# Patient Record
Sex: Male | Born: 1935 | Race: White | Hispanic: No | Marital: Married | Smoking: Former smoker
Health system: Southern US, Community
[De-identification: ages and names within clinical notes are randomized; demographics above are authoritative.]

## PROBLEM LIST (undated history)

## (undated) DIAGNOSIS — I10 Essential (primary) hypertension: Secondary | ICD-10-CM

## (undated) DIAGNOSIS — G4730 Sleep apnea, unspecified: Secondary | ICD-10-CM

## (undated) DIAGNOSIS — Z8709 Personal history of other diseases of the respiratory system: Secondary | ICD-10-CM

## (undated) DIAGNOSIS — Z87442 Personal history of urinary calculi: Secondary | ICD-10-CM

## (undated) DIAGNOSIS — J45909 Unspecified asthma, uncomplicated: Secondary | ICD-10-CM

## (undated) DIAGNOSIS — Z8669 Personal history of other diseases of the nervous system and sense organs: Secondary | ICD-10-CM

## (undated) DIAGNOSIS — R350 Frequency of micturition: Secondary | ICD-10-CM

## (undated) DIAGNOSIS — Z8719 Personal history of other diseases of the digestive system: Secondary | ICD-10-CM

## (undated) DIAGNOSIS — IMO0001 Reserved for inherently not codable concepts without codable children: Secondary | ICD-10-CM

## (undated) HISTORY — PX: SPINE SURGERY: SHX786

---

## 1998-05-20 ENCOUNTER — Ambulatory Visit (HOSPITAL_COMMUNITY): Admission: RE | Admit: 1998-05-20 | Discharge: 1998-05-20 | Payer: Self-pay | Admitting: *Deleted

## 2000-07-15 ENCOUNTER — Ambulatory Visit (HOSPITAL_BASED_OUTPATIENT_CLINIC_OR_DEPARTMENT_OTHER): Admission: RE | Admit: 2000-07-15 | Discharge: 2000-07-15 | Payer: Self-pay | Admitting: Internal Medicine

## 2001-10-01 ENCOUNTER — Encounter (INDEPENDENT_AMBULATORY_CARE_PROVIDER_SITE_OTHER): Payer: Self-pay | Admitting: Specialist

## 2001-10-01 ENCOUNTER — Ambulatory Visit (HOSPITAL_COMMUNITY): Admission: RE | Admit: 2001-10-01 | Discharge: 2001-10-01 | Payer: Self-pay | Admitting: *Deleted

## 2002-02-19 ENCOUNTER — Encounter: Payer: Self-pay | Admitting: Physical Medicine and Rehabilitation

## 2002-02-19 ENCOUNTER — Ambulatory Visit (HOSPITAL_COMMUNITY)
Admission: RE | Admit: 2002-02-19 | Discharge: 2002-02-19 | Payer: Self-pay | Admitting: Physical Medicine and Rehabilitation

## 2003-06-12 HISTORY — PX: BLEPHAROPLASTY: SUR158

## 2003-08-11 ENCOUNTER — Encounter (INDEPENDENT_AMBULATORY_CARE_PROVIDER_SITE_OTHER): Payer: Self-pay | Admitting: Specialist

## 2003-08-11 ENCOUNTER — Ambulatory Visit (HOSPITAL_COMMUNITY): Admission: RE | Admit: 2003-08-11 | Discharge: 2003-08-11 | Payer: Self-pay | Admitting: *Deleted

## 2003-10-26 ENCOUNTER — Ambulatory Visit (HOSPITAL_COMMUNITY): Admission: RE | Admit: 2003-10-26 | Discharge: 2003-10-26 | Payer: Self-pay | Admitting: Ophthalmology

## 2004-03-11 HISTORY — PX: LUMBAR FUSION: SHX111

## 2004-04-14 ENCOUNTER — Ambulatory Visit: Payer: Self-pay | Admitting: Physical Medicine & Rehabilitation

## 2004-04-14 ENCOUNTER — Inpatient Hospital Stay (HOSPITAL_COMMUNITY)
Admission: RE | Admit: 2004-04-14 | Discharge: 2004-04-19 | Payer: Self-pay | Admitting: Physical Medicine & Rehabilitation

## 2004-07-06 ENCOUNTER — Emergency Department (HOSPITAL_COMMUNITY): Admission: EM | Admit: 2004-07-06 | Discharge: 2004-07-06 | Payer: Self-pay | Admitting: Emergency Medicine

## 2004-07-14 ENCOUNTER — Emergency Department (HOSPITAL_COMMUNITY): Admission: EM | Admit: 2004-07-14 | Discharge: 2004-07-14 | Payer: Self-pay | Admitting: Emergency Medicine

## 2004-09-18 ENCOUNTER — Ambulatory Visit: Payer: Self-pay | Admitting: Internal Medicine

## 2004-09-29 ENCOUNTER — Ambulatory Visit: Payer: Self-pay | Admitting: Internal Medicine

## 2005-01-11 ENCOUNTER — Ambulatory Visit: Payer: Self-pay | Admitting: Internal Medicine

## 2005-01-27 ENCOUNTER — Emergency Department (HOSPITAL_COMMUNITY): Admission: EM | Admit: 2005-01-27 | Discharge: 2005-01-27 | Payer: Self-pay | Admitting: Emergency Medicine

## 2005-03-23 ENCOUNTER — Ambulatory Visit: Payer: Self-pay | Admitting: Internal Medicine

## 2005-04-10 ENCOUNTER — Ambulatory Visit: Payer: Self-pay | Admitting: Internal Medicine

## 2005-06-11 HISTORY — PX: ANTERIOR CERVICAL DECOMP/DISCECTOMY FUSION: SHX1161

## 2005-06-28 ENCOUNTER — Ambulatory Visit: Payer: Self-pay | Admitting: Internal Medicine

## 2005-08-08 ENCOUNTER — Ambulatory Visit (HOSPITAL_COMMUNITY): Admission: RE | Admit: 2005-08-08 | Discharge: 2005-08-08 | Payer: Self-pay | Admitting: *Deleted

## 2005-08-08 ENCOUNTER — Encounter (INDEPENDENT_AMBULATORY_CARE_PROVIDER_SITE_OTHER): Payer: Self-pay | Admitting: Specialist

## 2005-08-31 ENCOUNTER — Ambulatory Visit: Payer: Self-pay | Admitting: Internal Medicine

## 2005-12-27 ENCOUNTER — Ambulatory Visit: Payer: Self-pay | Admitting: Internal Medicine

## 2006-01-17 ENCOUNTER — Ambulatory Visit: Payer: Self-pay | Admitting: Internal Medicine

## 2006-03-07 ENCOUNTER — Ambulatory Visit: Payer: Self-pay | Admitting: Internal Medicine

## 2006-04-26 ENCOUNTER — Ambulatory Visit: Payer: Self-pay | Admitting: Internal Medicine

## 2006-09-09 ENCOUNTER — Ambulatory Visit: Payer: Self-pay | Admitting: Internal Medicine

## 2007-03-11 ENCOUNTER — Ambulatory Visit: Payer: Self-pay | Admitting: Internal Medicine

## 2007-06-20 DIAGNOSIS — J45909 Unspecified asthma, uncomplicated: Secondary | ICD-10-CM | POA: Insufficient documentation

## 2007-06-23 ENCOUNTER — Ambulatory Visit: Payer: Self-pay | Admitting: Internal Medicine

## 2007-07-01 DIAGNOSIS — G4730 Sleep apnea, unspecified: Secondary | ICD-10-CM | POA: Insufficient documentation

## 2007-07-01 DIAGNOSIS — K219 Gastro-esophageal reflux disease without esophagitis: Secondary | ICD-10-CM

## 2007-07-01 DIAGNOSIS — J309 Allergic rhinitis, unspecified: Secondary | ICD-10-CM | POA: Insufficient documentation

## 2007-09-03 ENCOUNTER — Ambulatory Visit: Payer: Self-pay | Admitting: Internal Medicine

## 2007-10-09 ENCOUNTER — Ambulatory Visit (HOSPITAL_COMMUNITY): Admission: RE | Admit: 2007-10-09 | Discharge: 2007-10-09 | Payer: Self-pay | Admitting: *Deleted

## 2007-10-09 ENCOUNTER — Encounter (INDEPENDENT_AMBULATORY_CARE_PROVIDER_SITE_OTHER): Payer: Self-pay | Admitting: *Deleted

## 2008-04-02 ENCOUNTER — Ambulatory Visit: Payer: Self-pay | Admitting: Internal Medicine

## 2008-06-21 ENCOUNTER — Ambulatory Visit: Payer: Self-pay | Admitting: Internal Medicine

## 2008-07-23 ENCOUNTER — Encounter: Payer: Self-pay | Admitting: Internal Medicine

## 2008-10-05 ENCOUNTER — Ambulatory Visit: Payer: Self-pay | Admitting: Internal Medicine

## 2009-02-04 ENCOUNTER — Ambulatory Visit (HOSPITAL_COMMUNITY): Admission: RE | Admit: 2009-02-04 | Discharge: 2009-02-04 | Payer: Self-pay | Admitting: *Deleted

## 2009-05-19 ENCOUNTER — Ambulatory Visit: Payer: Self-pay | Admitting: Internal Medicine

## 2009-05-25 ENCOUNTER — Ambulatory Visit: Payer: Self-pay | Admitting: Internal Medicine

## 2009-06-13 ENCOUNTER — Ambulatory Visit: Payer: Self-pay | Admitting: Internal Medicine

## 2009-10-20 ENCOUNTER — Telehealth: Payer: Self-pay | Admitting: Internal Medicine

## 2009-10-28 ENCOUNTER — Ambulatory Visit: Payer: Self-pay | Admitting: Internal Medicine

## 2009-10-28 ENCOUNTER — Telehealth: Payer: Self-pay | Admitting: Internal Medicine

## 2009-11-01 ENCOUNTER — Ambulatory Visit: Payer: Self-pay | Admitting: Internal Medicine

## 2010-05-03 ENCOUNTER — Ambulatory Visit: Payer: Self-pay | Admitting: Internal Medicine

## 2010-05-15 ENCOUNTER — Telehealth (INDEPENDENT_AMBULATORY_CARE_PROVIDER_SITE_OTHER): Payer: Self-pay | Admitting: *Deleted

## 2010-05-17 ENCOUNTER — Telehealth (INDEPENDENT_AMBULATORY_CARE_PROVIDER_SITE_OTHER): Payer: Self-pay | Admitting: *Deleted

## 2010-05-17 ENCOUNTER — Ambulatory Visit: Payer: Self-pay | Admitting: Internal Medicine

## 2010-05-17 DIAGNOSIS — J069 Acute upper respiratory infection, unspecified: Secondary | ICD-10-CM | POA: Insufficient documentation

## 2010-06-13 ENCOUNTER — Ambulatory Visit
Admission: RE | Admit: 2010-06-13 | Discharge: 2010-06-13 | Payer: Self-pay | Source: Home / Self Care | Attending: Internal Medicine | Admitting: Internal Medicine

## 2010-06-20 ENCOUNTER — Telehealth (INDEPENDENT_AMBULATORY_CARE_PROVIDER_SITE_OTHER): Payer: Self-pay | Admitting: *Deleted

## 2010-07-01 ENCOUNTER — Observation Stay (HOSPITAL_COMMUNITY)
Admission: EM | Admit: 2010-07-01 | Discharge: 2010-07-02 | Payer: Self-pay | Source: Home / Self Care | Attending: Internal Medicine | Admitting: Internal Medicine

## 2010-07-02 ENCOUNTER — Encounter: Payer: Self-pay | Admitting: Internal Medicine

## 2010-07-03 ENCOUNTER — Ambulatory Visit (HOSPITAL_COMMUNITY)
Admission: RE | Admit: 2010-07-03 | Discharge: 2010-07-03 | Payer: Self-pay | Source: Home / Self Care | Attending: Internal Medicine | Admitting: Internal Medicine

## 2010-07-03 NOTE — Discharge Summary (Addendum)
  NAME:  [name], [name] NO.:  [id number]  MEDICAL RECORD NO.:  [id number]          PATIENT TYPE:  INP  LOCATION:  3014                         FACILITY:  [facility]  PHYSICIAN:  Tarry Kos, MD       DATE OF BIRTH:  06-30-1935  DATE OF ADMISSION:  07/01/2010 DATE OF DISCHARGE:  07/02/2010                              DISCHARGE SUMMARY   DISCHARGE DIAGNOSES: 1. Presyncope. 2. Rule out cerebrovascular accident.  SUMMARY OF HOSPITAL COURSE:  Mr. Fariss is a 75 year old male who presented to the emergency room with headache, almost passing out, was admitted as there was some concern that he was having a stroke.  He had serial cardiac enzymes, which were all negative and thyroid studies were normal.  He had an MRI of his brain along with MRI/MRA of his head which was normal.  The patient's symptoms quickly resolved.  He is being discharged home to follow up with his primary care physician in 1-2 weeks at which point we will set up carotid Doppler ultrasound for him to complete his workup as an outpatient as he did not wish to stay another day to have this done tomorrow.  I instructed him to start taking a baby aspirin a day.  He is being discharged to continue his home medication regimen with the addition of again a baby aspirin a day, 81 mg.  PHYSICAL EXAMINATION:  VITAL SIGNS:  He has been afebrile.  His vital signs have been stable. GENERAL:  He has been alert and oriented x4, in no apparent distress, cooperating freely HEART:  Regular rate and rhythm without murmurs or gallops. CHEST:  Clear to auscultation bilaterally.  No wheezing or rales. ABDOMEN:  Soft, nontender, and nondistended.  Positive bowel sounds.  No hepatosplenomegaly. EXTREMITIES:  No clubbing, cyanosis, or edema. NEURO:  No focal neurologic deficits.  Cranial nerves II-XII are grossly intact. SKIN:  No rashes. PSYCH:  Normal mood and affect.          ______________________________ Tarry Kos, MD     RD/MEDQ  D:  07/02/2010  T:  07/03/2010  Job:  [id number]  Electronically Signed by Eldridge Dace MD on 07/03/2010 02:09:15 PM

## 2010-07-04 LAB — COMPREHENSIVE METABOLIC PANEL
ALT: 21 U/L (ref 0–53)
Alkaline Phosphatase: 66 U/L (ref 39–117)
Chloride: 106 mEq/L (ref 96–112)
Glucose, Bld: 104 mg/dL — ABNORMAL HIGH (ref 70–99)
Potassium: 3.7 mEq/L (ref 3.5–5.1)
Sodium: 140 mEq/L (ref 135–145)
Total Protein: 6.3 g/dL (ref 6.0–8.3)

## 2010-07-04 LAB — URINALYSIS, ROUTINE W REFLEX MICROSCOPIC
Bilirubin Urine: NEGATIVE
Hgb urine dipstick: NEGATIVE
Nitrite: NEGATIVE
Specific Gravity, Urine: 1.007 (ref 1.005–1.030)
Urine Glucose, Fasting: NEGATIVE mg/dL
pH: 6.5 (ref 5.0–8.0)

## 2010-07-04 LAB — CARDIAC PANEL(CRET KIN+CKTOT+MB+TROPI)
CK, MB: 4.8 ng/mL — ABNORMAL HIGH (ref 0.3–4.0)
Total CK: 122 U/L (ref 7–232)
Troponin I: 0.01 ng/mL (ref 0.00–0.06)

## 2010-07-04 LAB — LIPID PANEL
Cholesterol: 150 mg/dL (ref 0–200)
HDL: 45 mg/dL (ref >39)
LDL Cholesterol: 95 mg/dL (ref 0–99)
Total CHOL/HDL Ratio: 3.3 RATIO

## 2010-07-04 LAB — DIFFERENTIAL
Basophils Relative: 0 % (ref 0–1)
Eosinophils Absolute: 0.2 10*3/uL (ref 0.0–0.7)
Eosinophils Relative: 4 % (ref 0–5)
Lymphs Abs: 2.2 10*3/uL (ref 0.7–4.0)
Monocytes Absolute: 0.5 10*3/uL (ref 0.1–1.0)
Monocytes Relative: 9 % (ref 3–12)

## 2010-07-04 LAB — HEMOGLOBIN A1C
Hgb A1c MFr Bld: 5.1 % (ref <5.7)
Mean Plasma Glucose: 100 mg/dL (ref <117)

## 2010-07-04 LAB — CBC
MCH: 32.3 pg (ref 26.0–34.0)
MCHC: 35 g/dL (ref 30.0–36.0)
MCV: 92.2 fL (ref 78.0–100.0)
Platelets: 183 10*3/uL (ref 150–400)
RDW: 12.7 % (ref 11.5–15.5)

## 2010-07-04 LAB — POCT CARDIAC MARKERS: Troponin i, poc: <0.05 ng/mL (ref 0.00–0.09)

## 2010-07-04 LAB — CK TOTAL AND CKMB (NOT AT ARMC): Total CK: 151 U/L (ref 7–232)

## 2010-07-13 NOTE — Progress Notes (Signed)
Summary: prescription  Phone Note Call from Patient   Caller: Patient Call For: Dr. Maple Hudson Summary of Call: Patient phoned stated that he saw Dr. Maple Hudson last week and he has had the respiratory and feels like it is trying to return and wants to know if he can get a prescription to help fight it off. He uses [facility]. He can be reached at [phone] Initial call taken by: Vedia Coffer,  June 20, 2010 10:51 AM  Follow-up for Phone Call        Spoke with pt.  He is c/o "lungs don't feel normal".  He states that he had some dry cough last night before bed but none this am.  He states that his lungs heavy heavy.  He is worried that he is going to develop another infection and wants to know what CDY recs.  Pls advise thanks allergic to codiene and PCN Follow-up by: Vernie Murders,  June 20, 2010 11:07 AM  Additional Follow-up for Phone Call Additional follow up Details #1::        Per CDY, call in doxycycline 100 mg # 8- 2 today, then 1 once daily until gone and mucinex may also help. I called, spoke with pt and notified him of this and sent rx to [facility] per his request. Additional Follow-up by: Vernie Murders,  June 20, 2010 1:40 PM    New/Updated Medications: DOXYCYCLINE HYCLATE 100 MG CAPS (DOXYCYCLINE HYCLATE) 2 today, then 1 once daily until gone Prescriptions: DOXYCYCLINE HYCLATE 100 MG CAPS (DOXYCYCLINE HYCLATE) 2 today, then 1 once daily until gone  #8 x 0   Entered by:   Vernie Murders   Authorized by:   Waymon Budge MD   Signed by:   Vernie Murders on 06/20/2010   Method used:   Electronically to        [facility]* (retail)       [address]       [place], [place]  [id number]       Ph: [phone]       Fax: [phone]   RxID:   [id number]

## 2010-07-13 NOTE — Assessment & Plan Note (Signed)
Summary: no improv. in dry cough//jrc   Primary Provider/Referring Provider:  Merri Brunette  CC:  Dry cough still; not gettin completely better;waking pt up at night..  History of Present Illness: 06/21/08- Asthma, allergic rhinitis, OSA Has had persistent dry cough without overt trigger. Got both flu vax. OTW feels well without dyspnea or other discomfort.When  Left side down, she coughs more. Using Neti pot in AMs for morning stuffiness. Continues alllergy vaccine without problems- satisfied that it helps.   May 19, 2009--Presents for acute office visit. Has been using netipot, getting bloody mucus tinged with green nasal discharge , tightness in chest, dry cough, teeth pain, ear popping  x5days.  Using mucinex dm with some help. Denies chest pain, dyspnea, orthopnea, hemoptysis, fever, n/v/d, edema, headache,recent travel or antibiotics. Feels some better after using netipot.   June 13, 2009- Asthma, allergic rhinitis, OSA Strips sufficient for sleep apnea without EDS. Doesn't use CPAP. Uses Adviair seasonally. Has [employer] pot. Allergy vaccine remains good. He had cough when he tried quitting allergy vaccine.   2009-11-20- Asthma, allergic rhinitis, OSA His dry cough is a little worse. He admits getting his allergy shot less regularly, with no reaction. We gave prednisone May 12 with one left. Using Advair again just in past few days. He admits increased reflux.   Current Medications (verified): 1)  Advair Diskus 250-50 Mcg/dose  Misc (Fluticasone-Salmeterol) .... Use One Puff Two Times A Day Rinse Mouth After Each Use 2)  Proair Hfa 108 (90 Base) Mcg/act Aers (Albuterol Sulfate) .... 2 Puffs Four Times A Day As Needed 3)  Allergy Vaccine Go 1:10 (W-E) 4)  Epipen 0.3 Mg/0.17ml (1:1000) Devi (Epinephrine Hcl (Anaphylaxis)) .... For Severe Allergic Reaction 5)  Mucinex Dm Maximum Strength 60-1200 Mg Xr12h-Tab (Dextromethorphan-Guaifenesin) .Marland Kitchen.. 1-2 Every 12 Hours As  Needed 6)  Hydrochlorothiazide 25 Mg  Tabs (Hydrochlorothiazide) .... Take 1 Tablet By Mouth Once A Day 7)  Flomax 0.4 Mg  Cp24 (Tamsulosin Hcl) .... Take 1 Capsule By Mouth Two Times A Day 8)  Zicam Cold Remedy  Nasal Gel (Homeopathic Products) .... Use 2-3 Daily As Needed 9)  Omeprazole 20 Mg  Cpdr (Omeprazole) .... Take 1 Capsule By Mouth Two Times A Day 10)  Prednisone 10 Mg Tabs (Prednisone) .... 2 Tabs Daily X5 Days Then One Daily Until Gone 11)  Benzonatate 200 Mg Caps (Benzonatate) .... Take 1 Capsule By Mouth Three Times A Day As Needed Cough  Allergies (verified): 1)  ! Penicillin 2)  ! Codeine  Past History:  Past Medical History: Last updated: 06/21/2008 Allergic Rhinitis- vaccine immunotherapy Asthma G E R D Sleep Apnea  Past Surgical History: Last updated: 06/21/2008 Lumbar and cervical spine  Family History: Last updated: Nov 20, 2009 Parents died laryngeal cancer, pancreatic cancer.  Mother-deceased age 37; cancer. Father- deceased age 35; cancer. Sibling 1- living age 109 Daughter- bariatric surgery, asthma  Social History: Last updated: 06/13/2009 Patient states former smoker. Quit 1976; smoked for 18 years. Positive history of passive tobacco smoke exposure.  Exercise-2-3 times weekly Caffeine-1 cup a day ETOH- 3-4 beers a week. Married with 1 child.  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  Mother-deceased age 64; cancer. Father- deceased age 6; cancer. Sibling 1- living age 43 Daughter- bariatric surgery, asthma  Review of Systems      See HPI  The patient complains of weight gain and prolonged cough.  The patient denies anorexia, fever, weight loss, vision loss, decreased hearing, hoarseness, chest pain, syncope, dyspnea on exertion, peripheral edema, headaches, hemoptysis, abdominal pain,  melena, and severe indigestion/heartburn.    Vital Signs:  Patient profile:   75 year old male Height:      68 inches Weight:      194 pounds BMI:     29.60 O2 Sat:      90 % on Room air Pulse rate:   86 / minute BP sitting:   118 / 72  (left arm) Cuff size:   regular  Vitals Entered By: Reynaldo Minium CMA (Oct 28, 2009 3:19 PM)  O2 Flow:  Room air  Physical Exam  Additional Exam:  General: A/Ox3; pleasant and cooperative, NAD, SKIN: no rash, lesions NODES: no lymphadenopathy HEENT: [place]/AT, EOM- WNL, Conjuctivae- clear, PERRLA, TM-WNL, Nose- mild redness, clear discharge, max tenderness  Throat- clear and wnl NECK: Supple w/ fair ROM, JVD- none, normal carotid impulses w/o bruits Thyroid-  CHEST: coarse BS w/ no wheezing  HEART: RRR, no m/g/r heard ABDOMEN: Soft and nl;  ZOX:WRUE, nl pulses, no edema  NEURO: Grossly intact to observation      Impression & Recommendations:  Problem # 1:  ASTHMA (ICD-493.90) He has been a little irregular with control of his asthma and his GERD We will give samples of Advair 500 to use for a couple of weeks, then drop back.  If this doesn't settle him down, will need reassessment of maintenance, control and exposure.  Problem # 2:  G E R D (ICD-530.81)  He has a new GI appointment pending which should help. If the GERD is aqggravating asthma it should help Meanwhile I suggested two times a day use of omeprazole before meals. His updated medication list for this problem includes:    Omeprazole 20 Mg Cpdr (Omeprazole) .Marland Kitchen... Take 1 capsule by mouth two times a day  Other Orders: Est. Patient Level III (45409)  Patient Instructions: 1)  Keep appointment, or see me in one year/ as needed 2)  Finish the prednisone 3)  Try samples Advair 500, 1 puff and rinse twice daily, x 2 weeks, then return to Advair 250/50. 4)  Try omeprazole twice daily, before meals

## 2010-07-13 NOTE — Progress Notes (Signed)
Summary: requests to be seen- dr young pt  Phone Note Call from Patient   Caller: Patient Call For: young Summary of Call: pt c/o coughing and wheezing. he is on 3rd day of zpac. requests to be seen asap this am. pt # [phone] Initial call taken by: Tivis Ringer, CNA,  May 17, 2010 8:56 AM  Follow-up for Phone Call        OV today with Dr Maple Hudson at 3:00. Abigail Miyamoto RN  May 17, 2010 9:47 AM

## 2010-07-13 NOTE — Progress Notes (Signed)
Summary: ZPAK called to [facility] Note Call from Patient Call back at [phone]   Caller: Patient Call For: young Summary of Call: pt would like med called in for cough-prod-green, head congestion and sweats. pt states he is NOT having fever,chills or chest tightness. he is currently using mucinex and zycam with no relief ALLERGIES--pcn and cod.  pharmacy is [facility]--pls call once med is called in Initial call taken by: Philipp Deputy CMA,  May 15, 2010 12:17 PM  Follow-up for Phone Call        Per CDY-okay to give patient Zpak #1 take as directed no refills.Reynaldo Minium CMA  May 15, 2010 2:37 PM   Pt is aware of RX and will call if his sxs do not improve or get worse. Follow-up by: Michel Bickers CMA,  May 15, 2010 2:50 PM    New/Updated Medications: ZITHROMAX Z-PAK 250 MG TABS (AZITHROMYCIN) Take 2 tabs today then 1 by mouth daily until gone Prescriptions: ZITHROMAX Z-PAK 250 MG TABS (AZITHROMYCIN) Take 2 tabs today then 1 by mouth daily until gone  #1 x 0   Entered by:   Michel Bickers CMA   Authorized by:   Waymon Budge MD   Signed by:   Michel Bickers CMA on 05/15/2010   Method used:   Electronically to        [facility]* (retail)       [address]       [place], [place]  [address]       Ph: [phone]       Fax: [phone]   RxID:   [id number]

## 2010-07-13 NOTE — Progress Notes (Signed)
Summary: talk to nurse  Phone Note Call from Patient Call back at [phone]   Caller: Patient Call For: Kutter Schnepf Summary of Call: Pt states he is taking his meds as prescribed but cough isn't any better, pls advise. Initial call taken by: Darletta Moll,  Oct 28, 2009 9:41 AM  Follow-up for Phone Call        [facility] Carron Curie CMA  Oct 28, 2009 10:10 AM  Pt states he is almost done with presnisone and dry cough is no better and he is requesting an appt. Pt set for appt with CY today at 3pm. s.ign

## 2010-07-13 NOTE — Assessment & Plan Note (Signed)
Summary: ROV/ MBW   Primary Provider/Referring Provider:  Merri Brunette  CC:  Follow uip-needs Epipen RX.Marland Kitchen  History of Present Illness: 06/23/07- Good on annual followup. did get flu shot. Had 1 pvax. 2003. OK on allergy vaccine GO 1:10 (W-E) ROS neg for cough, wheeze, sneeze, dyspnea, daytime somnolence.  06/21/08- Asthma, allergic rhinitis, OSA Has had persistent dry cough without overt trigger. Got both flu vax. OTW feels well without dyspnea or other discomfort.When  Left side down, she coughs more. Using Neti pot in AMs for morning stuffiness. Continues alllergy vaccine without problems- satisfied that it helps.   May 19, 2009--Presents for acute office visit. Has been using netipot, getting bloody mucus tinged with green nasal discharge , tightness in chest, dry cough, teeth pain, ear popping  x5days.  Using mucinex dm with some help. Denies chest pain, dyspnea, orthopnea, hemoptysis, fever, n/v/d, edema, headache,recent travel or antibiotics. Feels some better after using netipot.   June 13, 2009- Asthma, allergic rhinitis, OSA Strips sufficient for sleep apnea without EDS. Doesn't use CPAP. Uses Adviair seasonally. Has [employer] pot. Allergy vaccine remains good. He had cough when he tried quitting allergy vaccine.     Current Medications (verified): 1)  Advair Diskus 250-50 Mcg/dose  Misc (Fluticasone-Salmeterol) .... Use One Puff Two Times A Day Rinse Mouth After Each Use 2)  Allergy Vaccine Go 1:10 (W-E) 3)  Epipen 0.3 Mg/0.70ml (1:1000) Devi (Epinephrine Hcl (Anaphylaxis)) .... For Severe Allergic Rhinitis 4)  Mucinex Dm Maximum Strength 60-1200 Mg Xr12h-Tab (Dextromethorphan-Guaifenesin) .Marland Kitchen.. 1-2 Every 12 Hours As Needed 5)  Hydrochlorothiazide 25 Mg  Tabs (Hydrochlorothiazide) .... Take 1 Tablet By Mouth Once A Day 6)  Flomax 0.4 Mg  Cp24 (Tamsulosin Hcl) .... Take 1 Capsule By Mouth Two Times A Day 7)  Zicam Cold Remedy  Nasal Gel (Homeopathic Products)  .... Use 2-3 Daily As Needed 8)  Omeprazole 20 Mg  Cpdr (Omeprazole) .... Take 1 Capsule By Mouth Two Times A Day  Allergies (verified): 1)  ! Penicillin 2)  ! Codeine  Past History:  Past Medical History: Last updated: 06/21/2008 Allergic Rhinitis- vaccine immunotherapy Asthma G E R D Sleep Apnea  Past Surgical History: Last updated: 06/21/2008 Lumbar and cervical spine  Family History: Last updated: 2008-07-15 Parents died laryngeal cancer, pancreatic cancer.  Mother-deceased age 60; cancer. Father- deceased age 44; cancer. Sibling 1- living age 79  Social History: Last updated: 06/13/2009 Patient states former smoker. Quit 1976; smoked for 18 years. Positive history of passive tobacco smoke exposure.  Exercise-2-3 times weekly Caffeine-1 cup a day ETOH- 3-4 beers a week. Married with 1 child.  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 Quit 1976; smoked for 18 years. Positive history of passive tobacco smoke exposure.  Exercise-2-3 times weekly Caffeine-1 cup a day ETOH- 3-4 beers a week. Married with 1 child.  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    Vital Signs:  Patient profile:   75 year old male Height:      68 inches Weight:      205 pounds BMI:     31.28 O2 Sat:      97 % on Room  air Pulse rate:   75 / minute BP sitting:   130 / 78  (left arm) Cuff size:   regular  Vitals Entered By: Reynaldo Minium CMA (June 13, 2009 9:46 AM)  O2 Flow:  Room air  Physical Exam  Additional Exam:  General: A/Ox3; pleasant and cooperative, NAD, SKIN: no rash,  lesions NODES: no lymphadenopathy HEENT: [place]/AT, EOM- WNL, Conjuctivae- clear, PERRLA, TM-WNL, Nose- mild redness, clear discharge, max tenderness  Throat- clear and wnl NECK: Supple w/ fair ROM, JVD- none, normal carotid impulses w/o bruits Thyroid-  CHEST: coarse BS w/ no wheezing  HEART: RRR, no m/g/r heard ABDOMEN: Soft and nl; nml bowel sounds; no organomegaly or masses noted EAV:WUJW, nl pulses, no edema  NEURO: Grossly intact to observation      Impression & Recommendations:  Problem # 1:  SLEEP APNEA (ICD-780.57)  Weight contol and nasal strips seem sufficient.  Problem # 2:  ASTHMA (ICD-493.90) Good control. We discused meds and his rescue inhaler.  Problem # 3:  ALLERGIC RHINITIS (ICD-477.9)  He does well with allergy vaccine. We will continue. Refill Epipen  Medications Added to Medication List This Visit: 1)  Proair Hfa 108 (90 Base) Mcg/act Aers (Albuterol sulfate) .... 2 puffs four times a day as needed 2)  Epipen 0.3 Mg/0.49ml (1:1000) Devi (Epinephrine hcl (anaphylaxis)) .... For severe allergic reaction  Other Orders: Est. Patient Level II (11914)  Patient Instructions: 1)  Schedule return in one year, earlier if needed 2)  Epipen refilled 3)  Continue present meds. Call if I can help. 4)  Thanks for what you do for [place]! Prescriptions: EPIPEN 0.3 MG/0.3ML (1:1000) DEVI (EPINEPHRINE HCL (ANAPHYLAXIS)) For severe allergic reaction  #1 x prn   Entered and Authorized by:   Waymon Budge MD   Signed by:   Waymon Budge MD on 06/13/2009   Method used:   Print then Give to Patient   RxID:   [id number]

## 2010-07-13 NOTE — Progress Notes (Signed)
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 says he has no fever. no other complaints. says the phlegm is clear. feels a "slight congestion" in his chest now. has some musinex DM but hasn't taken this yet.has been using his advair however.  if a rx can be called in to help, pt uses [facility]. however, if pt needs to be worked in w/ cy then pt requests to be seen tomorrow am- early. pt# [phone] Initial call taken by: Tivis Ringer, CNA,  Oct 20, 2009 9:29 AM  Follow-up for Phone Call        Please advise. Abigail Miyamoto RN  Oct 20, 2009 9:52 AM   Additional Follow-up for Phone Call Additional follow up Details #1::        Suggest we try prednisone 10 mg, # 15 2 daily x 5 days then 1 daily.  Offer benzonatate perles 200 mg, # 20 1 three times a day as needed cough; less sedating than codeine cough syrup, unless he wants the syrup. Additional Follow-up by: Waymon Budge MD,  Oct 20, 2009 9:59 AM    Additional Follow-up for Phone Call Additional follow up Details #2::    LMTCB. Carron Curie CMA  Oct 20, 2009 10:35 AM  rx sent pt aware. Carron Curie CMA  Oct 20, 2009 1:31 PM   New/Updated Medications: PREDNISONE 10 MG TABS (PREDNISONE) 2 tabs daily x5 days then one daily until gone BENZONATATE 200 MG CAPS (BENZONATATE) Take 1 capsule by mouth three times a day as needed cough Prescriptions: BENZONATATE 200 MG CAPS (BENZONATATE) Take 1 capsule by mouth three times a day as needed cough  #20 x 0   Entered by:   Carron Curie CMA   Authorized by:   Waymon Budge MD   Signed by:   Carron Curie CMA on 10/20/2009   Method used:   Electronically to        [facility]* (retail)       [address]       [place], [place]  [id number]       Ph: [phone]       Fax: [phone]   RxID:    [id number] PREDNISONE 10 MG TABS (PREDNISONE) 2 tabs daily x5 days then one daily until gone  #15 x 0   Entered by:   Carron Curie CMA   Authorized by:   Waymon Budge MD   Signed by:   Carron Curie CMA on 10/20/2009   Method used:   Electronically to        [facility]* (retail)       [address]       [place], [place]  [address]       Ph: [phone]       Fax: [phone]   RxID:   [id number]

## 2010-07-13 NOTE — Assessment & Plan Note (Signed)
Summary: OV- cough spasms, chest rattling, using albuterol,  on 3rd da...   Primary Provider/Referring Provider:  Merri Brunette  CC:  c/o cough, with green to clear production, and chest rattling x 1 week now denies sob.  History of Present Illness:  May 19, 2009--Presents for acute office visit. Has been using netipot, getting bloody mucus tinged with green nasal discharge , tightness in chest, dry cough, teeth pain, ear popping  x5days.  Using mucinex dm with some help. Denies chest pain, dyspnea, orthopnea, hemoptysis, fever, n/v/d, edema, headache,recent travel or antibiotics. Feels some better after using netipot.   June 13, 2009- Asthma, allergic rhinitis, OSA Strips sufficient for sleep apnea without EDS. Doesn't use CPAP. Uses Adviair seasonally. Has [employer] pot. Allergy vaccine remains good. He had cough when he tried quitting allergy vaccine.   2009-10-30- Asthma, allergic rhinitis, OSA His dry cough is a little worse. He admits getting his allergy shot less regularly, with no reaction. We gave prednisone May 12 with one left. Using Advair again just in past few days. He admits increased reflux.   May 17, 2010-  Asthma, allergic rhinitis, OSA Nurse-CC: c/o cough, with green to clear production, chest rattling x 1 week now denies sob Acute visit- now day 3 on Z pak for hard cough, nasal congestion. Light green. Minor headache. He resumed Advair a few days ago and took Advertising account planner today for fist time. Using Neti pot and mucinex.   Asthma History    Initial Asthma Severity Rating:    Age range: 12+ years    Symptoms: 0-2 days/week    Nighttime Awakenings: 0-2/month    Interferes w/ normal activity: no limitations    SABA use (not for EIB): 0-2 days/week    Asthma Severity Assessment: Intermittent   Preventive Screening-Counseling & Management  Alcohol-Tobacco     Smoking Status: quit > 6 months     Year Quit: 1976  Current Medications (verified): 1)   Advair Diskus 250-50 Mcg/dose  Misc (Fluticasone-Salmeterol) .... Use One Puff Two Times A Day Rinse Mouth After Each Use 2)  Proair Hfa 108 (90 Base) Mcg/act Aers (Albuterol Sulfate) .... 2 Puffs Four Times A Day As Needed 3)  Allergy Vaccine Go 1:10 (W-E) 4)  Epipen 0.3 Mg/0.10ml (1:1000) Devi (Epinephrine Hcl (Anaphylaxis)) .... For Severe Allergic Reaction 5)  Mucinex Dm Maximum Strength 60-1200 Mg Xr12h-Tab (Dextromethorphan-Guaifenesin) .Marland Kitchen.. 1-2 Every 12 Hours As Needed 6)  Hydrochlorothiazide 25 Mg  Tabs (Hydrochlorothiazide) .... Take 1 Tablet By Mouth Once A Day 7)  Flomax 0.4 Mg  Cp24 (Tamsulosin Hcl) .... Take 1 Capsule By Mouth Two Times A Day 8)  Zicam Cold Remedy  Nasal Gel (Homeopathic Products) .... Use 2-3 Daily As Needed 9)  Omeprazole 20 Mg  Cpdr (Omeprazole) .... Take 1 Capsule By Mouth Two Times A Day 10)  Benzonatate 200 Mg Caps (Benzonatate) .... Take 1 Capsule By Mouth Three Times A Day As Needed Cough 11)  Singulair 10 Mg Tabs (Montelukast Sodium) .... Take 1 By Mouth Once Daily 12)  Zithromax Z-Pak 250 Mg Tabs (Azithromycin) .... Take 2 Tabs Today Then 1 By Mouth Daily Until Gone  Allergies: 1)  ! Penicillin 2)  ! Codeine  Past History:  Past Medical History: Last updated: 06/21/2008 Allergic Rhinitis- vaccine immunotherapy Asthma G E R D Sleep Apnea  Past Surgical History: Last updated: 06/21/2008 Lumbar and cervical spine  Family History: Last updated: 2009-10-30 Parents died laryngeal cancer, pancreatic  cancer.  Mother-deceased age 62; cancer. Father- deceased age 19; cancer. Sibling 1- living age 56 Daughter- bariatric surgery, asthma  Social History: Last updated: 06/13/2009 Patient states former smoker. Quit 1976; smoked for 18 years. Positive history of passive tobacco smoke exposure.  Exercise-2-3 times weekly Caffeine-1 cup a day ETOH- 3-4 beers a week. Married with 1 child.  Currently [employer] of [place]  Risk Factors: Smoking  Status: quit > 6 months (05/17/2010) Passive Smoke Exposure: yes (07/14/2008)  Social History: Smoking Status:  quit > 6 months  Review of Systems      See HPI       The patient complains of shortness of breath with activity.  The patient denies shortness of breath at rest, productive cough, non-productive cough, coughing up blood, chest pain, irregular heartbeats, acid heartburn, indigestion, loss of appetite, weight change, abdominal pain, difficulty swallowing, sore throat, tooth/dental problems, headaches, nasal congestion/difficulty breathing through nose, sneezing, itching, ear ache, anxiety, rash, change in color of mucus, and fever.    Vital Signs:  Patient profile:   75 year old male Height:      68 inches Weight:      198.38 pounds BMI:     30.27 O2 Sat:      95 % on Room air Pulse rate:   82 / minute BP sitting:   100 / 60  (right arm) Cuff size:   regular  Vitals Entered By: Kandice Hams CMA (May 17, 2010 3:05 PM)  O2 Flow:  Room air CC: c/o cough, with green to clear production, chest rattling x 1 week now denies sob Comments pharmacy verfied   Physical Exam  General:  on supplemental oxygen.   Additional Exam:  General: A/Ox3; pleasant and cooperative, NAD, SKIN: no rash, lesions NODES: no lymphadenopathy HEENT: [place]/AT, EOM- WNL, Conjuctivae- clear, PERRLA, TM-WNL, Nose- mild redness, clear discharge, Nasal stuffy  Throat- clear and wnl NECK: Supple w/ fair ROM, JVD- none, normal carotid impulses w/o bruits Thyroid-  CHEST: coarse BS w/ no wheezing , hard cough HEART: RRR, no m/g/r heard ABDOMEN: Soft and nl;  MVH:QION, nl pulses, no edema  NEURO: Grossly intact to observation      Impression & Recommendations:  Problem # 1:  UPPER RESPIRATORY INFECTION, ACUTE, WITH BRONCHITIS (ICD-465.9)  He will finish Zpak. I am concerned he may be headed toward a sinusitis and will give a back up script for biaxin and some tramadol for cough. His updated  medication list for this problem includes:    Mucinex Dm Maximum Strength 60-1200 Mg Xr12h-tab (Dextromethorphan-guaifenesin) .Marland Kitchen... 1-2 every 12 hours as needed    Benzonatate 200 Mg Caps (Benzonatate) .Marland Kitchen... Take 1 capsule by mouth three times a day as needed cough  Problem # 2:  ASTHMA (ICD-493.90) Long term control has been good.  Medications Added to Medication List This Visit: 1)  Clarithromycin 500 Mg Tabs (Clarithromycin) .Marland Kitchen.. 1 two times a day after meals 2)  Tramadol Hcl 50 Mg Tabs (Tramadol hcl) .Marland Kitchen.. 1 three times a day as needed cough  Other Orders: Est. Patient Level III (62952)  Patient Instructions: 1)  Please schedule a follow-up appointment in 1 year. Please call earlier as needed 2)  Finish the Z pak 3)  Mucinex, fluids, comfort meds all may help, but stay warm, dry and get enough rest and this too shall pass. 4)  Standby script for generic biaxin antibiotic in case needed 5)  Script for tramadol if needed for cough Prescriptions: TRAMADOL HCL  50 MG TABS (TRAMADOL HCL) 1 three times a day as needed cough  #15 x 0   Entered and Authorized by:   Waymon Budge MD   Signed by:   Waymon Budge MD on 05/17/2010   Method used:   Print then Give to Patient   RxID:   [id number] CLARITHROMYCIN 500 MG TABS (CLARITHROMYCIN) 1 two times a day after meals  #14 x 0   Entered and Authorized by:   Waymon Budge MD   Signed by:   Waymon Budge MD on 05/17/2010   Method used:   Print then Give to Patient   RxID:   [id number]

## 2010-07-13 NOTE — Assessment & Plan Note (Signed)
Summary: ROV - OK PER Renette Butters ///KP   Primary Provider/Referring Provider:  Merri Brunette   History of Present Illness: 2009/11/17- Asthma, allergic rhinitis, OSA His dry cough is a little worse. He admits getting his allergy shot less regularly, with no reaction. We gave prednisone May 12 with one left. Using Advair again just in past few days. He admits increased reflux.   May 17, 2010-  Asthma, allergic rhinitis, OSA Nurse-CC: c/o cough, with green to clear production, chest rattling x 1 week now denies sob Acute visit- now day 3 on Z pak for hard cough, nasal congestion. Light green. Minor headache. He resumed Advair a few days ago and took Advertising account planner today for fist time. Using Neti pot and mucinex.   June 13, 2010-  Asthma, allergic rhinitis, OSA He feels much better now as he slowly resolves the viral pattern bronchitis for which he was seen last.  Incidentally mentions small hematoma at site of latest allergy shot with no other bleeding.  We discussed exam in relation to CXR last year showing some bibasilar scarring/ atelectasis.   Asthma History    Asthma Control Assessment:    Age range: 12+ years    Symptoms: 0-2 days/week    Nighttime Awakenings: 0-2/month    Interferes w/ normal activity: no limitations    SABA use (not for EIB): 0-2 days/week    Asthma Control Assessment: Well Controlled   Preventive Screening-Counseling & Management  Alcohol-Tobacco     Smoking Status: quit > 6 months     Packs/Day: 1.0     Year Quit: 1976     Passive Smoke Exposure: yes  Current Medications (verified): 1)  Advair Diskus 250-50 Mcg/dose  Misc (Fluticasone-Salmeterol) .... Use One Puff Two Times A Day Rinse Mouth After Each Use 2)  Proair Hfa 108 (90 Base) Mcg/act Aers (Albuterol Sulfate) .... 2 Puffs Four Times A Day As Needed 3)  Allergy Vaccine Go 1:10 (W-E) 4)  Epipen 0.3 Mg/0.31ml (1:1000) Devi (Epinephrine Hcl (Anaphylaxis)) .... For Severe Allergic Reaction 5)  Mucinex Dm  Maximum Strength 60-1200 Mg Xr12h-Tab (Dextromethorphan-Guaifenesin) .Marland Kitchen.. 1-2 Every 12 Hours As Needed 6)  Hydrochlorothiazide 25 Mg  Tabs (Hydrochlorothiazide) .... Take 1 Tablet By Mouth Once A Day 7)  Flomax 0.4 Mg  Cp24 (Tamsulosin Hcl) .... Take 1 Capsule By Mouth Two Times A Day 8)  Zicam Cold Remedy  Nasal Gel (Homeopathic Products) .... Use 2-3 Daily As Needed 9)  Omeprazole 20 Mg  Cpdr (Omeprazole) .... Take 1 Capsule By Mouth Two Times A Day 10)  Benzonatate 200 Mg Caps (Benzonatate) .... Take 1 Capsule By Mouth Three Times A Day As Needed Cough 11)  Singulair 10 Mg Tabs (Montelukast Sodium) .... Take 1 By Mouth Once Daily As Needed  Allergies (verified): 1)  ! Penicillin 2)  ! Codeine  Past History:  Past Medical History: Last updated: 06/21/2008 Allergic Rhinitis- vaccine immunotherapy Asthma G E R D Sleep Apnea  Past Surgical History: Last updated: 06/21/2008 Lumbar and cervical spine  Family History: Last updated: Nov 17, 2009 Parents died laryngeal cancer, pancreatic cancer.  Mother-deceased age 36; cancer. Father- deceased age 22; cancer. Sibling 1- living age 9 Daughter- bariatric surgery, asthma  Social History: Last updated: 06/13/2010 Patient states former smoker. Quit 1976; smoked for 18 years. Positive history of passive tobacco smoke exposure.  Exercise-2-3 times weekly Caffeine-1 cup a day ETOH- 3-4 beers a week. Married with 1 child.  Former Forensic scientist of [employer]  Risk Factors: Smoking  Status: quit > 6 months (06/13/2010) Packs/Day: 1.0 (06/13/2010) Passive Smoke Exposure: yes (06/13/2010)  Social History: Patient states former smoker. Quit 1976; smoked for 18 years. Positive history of passive tobacco smoke exposure.  Exercise-2-3 times weekly Caffeine-1 cup a day ETOH- 3-4 beers a week. Married with 1 child.  [place]  1.0  Review of Systems      See HPI       The patient complains of shortness of breath  with activity and non-productive cough.  The patient denies shortness of breath at rest, productive cough, coughing up blood, chest pain, irregular heartbeats, acid heartburn, indigestion, loss of appetite, weight change, abdominal pain, difficulty swallowing, sore throat, tooth/dental problems, headaches, nasal congestion/difficulty breathing through nose, and sneezing.    Vital Signs:  Patient profile:   75 year old male Height:      68 inches Weight:      199 pounds BMI:     30.37 O2 Sat:      99 % on Room air Pulse rate:   63 / minute BP sitting:   124 / 80  (left arm) Cuff size:   regular  Vitals Entered By: Reynaldo Minium CMA (June 13, 2010 9:21 AM)  O2 Flow:  Room air   Physical Exam  Additional Exam:  General: A/Ox3; pleasant and cooperative, NAD, SKIN: no rash, lesions NODES: no lymphadenopathy HEENT: [place]/AT, EOM- WNL, Conjuctivae- clear, PERRLA, TM-WNL, Nose- mild redness, clear discharge, Nasal stuffy  Throat- clear and wnl, Mallampati  II NECK: Supple w/ fair ROM, JVD- none, normal carotid impulses w/o bruits Thyroid-  CHEST: coarse BS w/ no wheezing ,  HEART: RRR, no m/g/r heard ABDOMEN: Soft and nl;  EAV:WUJW, nl pulses, no edema  NEURO: Grossly intact to observation      Impression & Recommendations:  Problem # 1:  UPPER RESPIRATORY INFECTION, ACUTE, WITH BRONCHITIS (ICD-465.9)  Resolving steadily now. We discussed mild crackles heard in bases on chest exam as likely related to this. If symptoms linger we will update CXR but he seems ok for now.  His updated medication list for this problem includes:    Mucinex Dm Maximum Strength 60-1200 Mg Xr12h-tab (Dextromethorphan-guaifenesin) .Marland Kitchen... 1-2 every 12 hours as needed    Benzonatate 200 Mg Caps (Benzonatate) .Marland Kitchen... Take 1 capsule by mouth three times a day as needed cough  Problem # 2:  ALLERGIC RHINITIS (ICD-477.9)  He has done well and will continue allergy vaccine.   Medications Added to Medication List  This Visit: 1)  Singulair 10 Mg Tabs (Montelukast sodium) .... Take 1 by mouth once daily as needed  Other Orders: Est. Patient Level III (11914)  Patient Instructions: 1)  Please schedule a follow-up appointment in 6 months. Please cal sooner if needed.

## 2010-09-13 ENCOUNTER — Ambulatory Visit (INDEPENDENT_AMBULATORY_CARE_PROVIDER_SITE_OTHER): Payer: Medicare Other

## 2010-09-13 DIAGNOSIS — J301 Allergic rhinitis due to pollen: Secondary | ICD-10-CM

## 2010-10-19 ENCOUNTER — Encounter: Payer: Self-pay | Admitting: Internal Medicine

## 2010-10-23 ENCOUNTER — Encounter: Payer: Self-pay | Admitting: Internal Medicine

## 2010-10-23 ENCOUNTER — Ambulatory Visit (INDEPENDENT_AMBULATORY_CARE_PROVIDER_SITE_OTHER): Payer: Medicare Other | Admitting: Internal Medicine

## 2010-10-23 VITALS — BP 134/72 | HR 80 | Ht 68.0 in | Wt 197.0 lb

## 2010-10-23 DIAGNOSIS — J309 Allergic rhinitis, unspecified: Secondary | ICD-10-CM

## 2010-10-23 DIAGNOSIS — J45909 Unspecified asthma, uncomplicated: Secondary | ICD-10-CM

## 2010-10-23 DIAGNOSIS — G4730 Sleep apnea, unspecified: Secondary | ICD-10-CM

## 2010-10-23 NOTE — Assessment & Plan Note (Addendum)
He will continue allergy vaccine. He may find he does best holding at 0.4 of 1:10 as discussed.

## 2010-10-23 NOTE — Assessment & Plan Note (Signed)
He had tried CPAP and given up. Maintaining weight

## 2010-10-23 NOTE — Progress Notes (Signed)
  Subjective:    Patient ID: [name], male    DOB: Apr 21, 1936, 75 y.o.   MRN: [id number]  HPI 75 yo former smoker followed for allergic rhinitis, OSA, Asthma, complicated by GERD.  Last here January 3 when he was resolving a viral bronchitis.  He has continued allergy shots, reducing his dose to 0.4 ml/ vial because of local reactions.1:10 GO.  He got through Winter and Spring pollen otherwise doing fine.   Review of Systems Constitutional:   No weight loss, night sweats,  Fevers, chills, fatigue, lassitude. HEENT:   No headaches,  Difficulty swallowing,  Tooth/dental problems,  Sore throat,                No sneezing, itching, ear ache, nasal congestion, post nasal drip,   CV:  No chest pain,  Orthopnea, PND, swelling in lower extremities, anasarca, dizziness, palpitations  GI  No heartburn, indigestion, abdominal pain, nausea, vomiting, diarrhea, change in bowel habits, loss of appetite  Resp: No shortness of breath with exertion or at rest.  No excess mucus, no productive cough,  No non-productive cough,  No coughing up of blood.  No change in color of mucus.  No wheezing.  Skin: no rash or lesions.  GU: no dysuria, change in color of urine, no urgency or frequency.  No flank pain.  MS:  No joint pain or swelling.  No decreased range of motion.  No back pain.  Psych:  No change in mood or affect. No depression or anxiety.  No memory loss.      Objective:   Physical Exam General- Alert, Oriented, Affect-appropriate, Distress- none acute  Skin- rash-none, lesions- none, excoriation- none  Lymphadenopathy- none  Head- atraumatic  Eyes- Gross vision intact, PERRLA, conjunctivae clear secretions  Ears- Hearing, canals, Tm - normal  Nose- Clear, No-Septal dev, mucus, polyps, erosion, perforation   Throat- Mallampati II , mucosa clear , drainage- none, tonsils- atrophic  Neck- flexible , trachea midline, no stridor , thyroid nl, carotid no bruit  Chest -  symmetrical excursion , unlabored     Heart/CV- RRR , no murmur , no gallop  , no rub, nl s1 s2                     - JVD- none , edema- none, stasis changes- none, varices- none     Lung- clear to P&A, wheeze- none, cough- none , dullness-none, rub- none     Chest wall-   Abd- tender-no, distended-no, bowel sounds-present, HSM- no  Br/ Gen/ Rectal- Not done, not indicated  Extrem- cyanosis- none, clubbing, none, atrophy- none, strength- nl  Neuro- grossly intact to observation         Assessment & Plan:

## 2010-10-23 NOTE — Patient Instructions (Signed)
You may find that you do well staying at 0.4 ml/ vial on your shots. You can explore going back up to 0.5 ml later if needed.,

## 2010-10-23 NOTE — Assessment & Plan Note (Signed)
Good control. Tolerating very active cardio gym work-outs. No changes needed.

## 2010-10-24 ENCOUNTER — Encounter: Payer: Self-pay | Admitting: Internal Medicine

## 2010-10-24 NOTE — Op Note (Signed)
NAME:  Thomas, Cook NO.:  [id number]   MEDICAL RECORD NO.:  [id number]          PATIENT TYPE:  AMB   LOCATION:  ENDO                         FACILITY:  [facility]   PHYSICIAN:  Georgiana Spinner, M.D.    DATE OF BIRTH:  [birth year]   DATE OF PROCEDURE:  10/09/2007  DATE OF DISCHARGE:                               OPERATIVE REPORT   PROCEDURE:  Upper endoscopy with biopsy.   INDICATIONS:  GERD.   ANESTHESIA:  Fentanyl 75 mcg, Versed 9 mg.   PROCEDURE:  With the patient mildly sedated in the left lateral  decubitus position, the Pentax videoscopic endoscope was inserted in the  mouth and passed under direct vision through the esophagus, which  appeared normal until we reached distal esophagus and there was a  questionable area of Barrett's, photographed and biopsied.  We entered  into the stomach.  The fundus, body, antrum, duodenal bulb, and second  portion of duodenum all appeared normal.  From this point the endoscope  was slowly withdrawn, taking circumferential views of the duodenal  mucosa until the endoscope had been pulled back into the stomach, placed  in retroflexion to view the stomach from below.  We could see a loose  wrap of the GE junction which was photographed.  The endoscope was  straightened and withdrawn.  The patient's vital signs and pulse  oximeter remained stable.  The patient tolerated the procedure well  without apparent complications.   FINDINGS:  Loose wrap of the GE junction indicating laxity of the lower  esophageal sphincter and possible Barrett's esophagus, biopsied.   Await biopsy report.  The patient will call me for the results and  follow up with me as needed as an outpatient.           ______________________________  Georgiana Spinner, M.D.     GMO/MEDQ  D:  10/09/2007  T:  10/09/2007  Job:  [id number]

## 2010-10-27 NOTE — Consult Note (Signed)
NAME:  [name], [name] NO.:  [id number]   MEDICAL RECORD NO.:  [id number]          PATIENT TYPE:  EMS   LOCATION:  ED                           FACILITY:  [facility]   PHYSICIAN:  Sigmund I. Patsi Sears, M.D.DATE OF BIRTH:  1936-03-18   DATE OF CONSULTATION:  01/27/2005  DATE OF DISCHARGE:                                   CONSULTATION   REASON FOR CONSULTATION:  A 75 year old male seen at [facility] Emergency  Room complaining of left flank pain.   The patient has a past history of multiple kidney stones over the last 20  years, but currently has a several hour history of left flank pain, with  intensifying nausea.  No vomiting yet.  His pain level is an 8/10 currently.  No fevers and no chills.  The patient has been known to have a kidney stone  evaluated by Dr. Vonita Moss in the past and has a future appointment with Dr.  Vonita Moss for followup.  Otherwise, the patient has no difficulty voiding.   PAST MEDICAL HISTORY:  Kidney stones.   FAMILY HISTORY:  Noncontributory.   SOCIAL HISTORY:  No tobacco.  Alcohol occasionally only.   PHYSICAL EXAMINATION:  GENERAL:  Well-developed, white male in mild to  moderate distress.  VITAL SIGNS:  As noted in the chart and within normal limits.  ABDOMEN:  Soft, positive bowel sounds without organomegaly, no masses.  There is some left CVA pain to palpation and some left lower quadrant pain  to palpation.  GENITALIA:  Normal penis, normal urethra and normal glands.  RECTAL:  Not indicated on this examination.  EXTREMITIES:  No cyanosis or edema.  LYMPHATICS:  Nonpalpable.  NEUROLOGIC:  Normal orientation to time, person and place.   LABORATORY DATA AND X-RAY FINDINGS:  CT scan evaluated with the patient and  shows that the patient has two kidney stones, one in the lower ureter and  one in the kidney.   RECOMMENDATIONS:  We have evaluated the patient's CT with him and he will  follow up with Dr. Vonita Moss next week.  He has  Darvocet at home for pain and  is given IV Toradol with excellent result in the emergency room.      Sigmund I. Patsi Sears, M.D.  Electronically Signed     SIT/MEDQ  D:  01/30/2005  T:  01/30/2005  Job:  [id number]   cc:   Maretta Bees. Vonita Moss, M.D.  [address]  [place]  [place] [address]  Fax: [phone]

## 2010-10-27 NOTE — Op Note (Signed)
NAME:  LANSING, SIGMON NO.:  [id number]   MEDICAL RECORD NO.:  [id number]                   PATIENT TYPE:  AMB   LOCATION:  ENDO                                 FACILITY:  [facility]   PHYSICIAN:  Georgiana Spinner, M.D.                 DATE OF BIRTH:  07-11-1935   DATE OF PROCEDURE:  08/11/2003  DATE OF DISCHARGE:                                 OPERATIVE REPORT   PROCEDURE:  Colonoscopy.   INDICATIONS:  Colon polyp.   ANESTHESIA:  Demerol 20 mg, Versed 2 mg.   DESCRIPTION OF PROCEDURE:  With the patient mildly sedated in the left  lateral decubitus position, a rectal exam was performed which was  unremarkable.  Subsequently the Olympus videoscopic colonoscope was inserted  in the rectum and passed under direct vision to the cecum, identified by  ileocecal valve and base of the cecum, both of which were photographed.  From this point the colonoscope was slowly withdrawn, taking circumferential  views of the colonic mucosa, stopping in the ascending colon, at which point  a small polyp was seen, photographed, and removed using snare technique.  The snare pulled through the small polyp without cautery and fragmented the  polyp, and we suctioned the debris into the endoscope and it was ultimately  retrieved.  The endoscope was then withdrawn taking circumferential views of  the remaining colonic mucosa, stopping to photograph diverticula along the  way until we reached the rectum, which appeared normal on direct and showed  hemorrhoids on retroflexed view.  The endoscope was straightened and  withdrawn.  The patient's vital signs and pulse oximetry remained stable.  The patient tolerated the procedure well and without apparent complication.   FINDINGS:  1. Small polyp of the ascending colon.  2. Diverticulosis of the sigmoid colon.  3. Internal hemorrhoids.   PLAN:  Await biopsy report.  The patient will call me for results and follow  up with me as an  outpatient.                                               Georgiana Spinner, M.D.    GMO/MEDQ  D:  08/11/2003  T:  08/11/2003  Job:  [id number]

## 2010-10-27 NOTE — Op Note (Signed)
NAME:  [name], [name]                        ACCOUNT NO.:  [id number]   MEDICAL RECORD NO.:  [id number]                   PATIENT TYPE:  OIB   LOCATION:  2899                                 FACILITY:  [facility]   PHYSICIAN:  Robert L. Dione Booze, M.D.               DATE OF BIRTH:  09/21/1935   DATE OF PROCEDURE:  10/26/2003  DATE OF DISCHARGE:  10/26/2003                                 OPERATIVE REPORT   INDICATIONS AND JUSTIFICATIONS FOR THE PROCEDURE:  [name] has been  followed in my office for a number of years and was seen recently, regarding  the possibility of upper eyelid blepharoplasty.  He does have a very severe  dermatochalasis with the skin actually covering his eyelashes and coming to  within about 2 mm of each pupil.  He is aware of the weight of the skin the  skin of the upper eyelids and it is very bothersome to him.  He can tell  this causes a significant reduction in the upper visual field, indeed, is  missing approximately 75% of the visual field superiorly in the right eye  and 95% of the superior field in the left eye when the skin is taped  compared to when it is not taped.  Photographs and visual field testing  document this and it is actually quite a severe problem.  The rest of the  eye examination is unremarkable and he is correctable to around 20/20 or  20/25 vision.  After discussing the problem with him, he decided he wanted  upper eyelid blepharoplasties for medical reasons.   JUSTIFICATION FOR OUTPATIENT SETTING:  Routine.   JUSTIFICATION FOR OVERNIGHT STAY:  None.   PREOPERATIVE DIAGNOSIS:  Severe dermatochalasis with impairment of the  superior visual field.   POSTOPERATIVE DIAGNOSIS:  Severe dermatochalasis with impairment of the  superior visual field.   OPERATION PERFORMED:  Upper eyelid blepharoplasties.   SURGEON:  Robert L. Dione Booze, M.D.   ANESTHESIA:  1% Xylocaine with epinephrine.   DESCRIPTION OF PROCEDURE:  The patient arrived in the  minor surgery room at  [facility] and was prepped and draped in the routine fashion.  1%  Xylocaine with epinephrine was administered to the skin of each upper  eyelid.  The skin to be removed was carefully demarcated and then excised.  Bleeding was controlled with cautery and pressure.  After each wound was  sutured with a 6-0 nylon suture, cold compresses were applied.  The patient  left the minor room having done nicely.   FOLLOWUP CARE:  The patient will be seen in my office in six days to have  the sutures removed.  He is to use warm compresses starting the day after  surgery.  Robert L. Dione Booze, M.D.    RLG/MEDQ  D:  10/26/2003  T:  10/27/2003  Job:  [id number]   cc:   Soyla Murphy. Renne Crigler, M.D.  [address]  [place]  [place] [address]  Fax: [phone]

## 2010-10-27 NOTE — Op Note (Signed)
NAME:  JHASE, CREPPEL NO.:  [id number]   MEDICAL RECORD NO.:  [id number]          PATIENT TYPE:  AMB   LOCATION:  ENDO                         FACILITY:  [facility]   PHYSICIAN:  Georgiana Spinner, M.D.    DATE OF BIRTH:  12-07-1935   DATE OF PROCEDURE:  08/08/2005  DATE OF DISCHARGE:                                 OPERATIVE REPORT   PROCEDURE:  Colonoscopy.   INDICATIONS:  Colon cancer screening.   ANESTHESIA:  Demerol 10, Versed 1 mg.   DESCRIPTION OF PROCEDURE:  With the patient mildly sedated in the left  lateral decubitus position, a rectal exam was performed which was  unremarkable to my exam. Subsequently the Olympus videoscopic colonoscope  was inserted in the rectum and passed under direct vision to the cecum  identified by the ileocecal valve and crow's foot of the cecum both of which  were photographed. From this point, the colonoscope was slowly withdrawn  taking circumferential views of the colonic mucosa stopping only in the  rectum which appeared normal on direct and showed hemorrhoids on retroflexed  view. The endoscope was straightened and withdrawn. The patient's vital  signs and pulse oximeter remained stable. The patient tolerated the  procedure well without apparent complications.   FINDINGS:  Changes in internal hemorrhoids otherwise an unremarkable exam.   PLAN:  Repeat possibly in 5-10 years.           ______________________________  Georgiana Spinner, M.D.     GMO/MEDQ  D:  08/08/2005  T:  08/09/2005  Job:  [id number]

## 2010-10-27 NOTE — Assessment & Plan Note (Signed)
[facility]                               PULMONARY OFFICE NOTE   NAME:KNIGHTMiriam, Kestler                     MRN:          [id number]  DATE:03/07/2006                            DOB:          1936/03/15    HISTORY OF PRESENT ILLNESS:  The patient is a pleasant male patient of Dr.  Maple Hudson who has a known history of asthma who presents for a one-week history  of nasal congestion, productive cough with light yellow-green sputum and  intermittent wheezing.  The patient is maintained on Advair 250/50 twice  daily and p.r.n. albuterol.  The patient reports he has had to use his  albuterol approximately 2-3 times over the last week.  At baseline, the  patient has very rare albuterol use.  The patient did finish a Z-Pak  approximately two days and reports that his sputum is improving; however, he  continues to have a cough and intermittent wheezing.   PAST MEDICAL HISTORY:  Reviewed.   CURRENT MEDICATIONS:  Reviewed.   PHYSICAL EXAM:  GENERAL:  The patient is a pleasant male in no acute  distress.  VITAL SIGNS:  He is afebrile, stable vital signs.  O2 saturation is 93% on  room air.  HEENT:  Nasal mucosa has some mild erythema.  Nontender sinuses to pressure.  Oropharynx clear.  NECK:  Supple without adenopathy.  LUNG SOUNDS:  Somewhat coarse.  No wheezing is noted.  CARDIAC:  Regular rate and rhythm.  ABDOMEN:  Soft, nontender.  EXTREMITIES:  Warm without any edema.   IMPRESSION AND PLAN:  Acute upper respiratory infection with mild asthmatic  flare.  The patient is to continue the Mucinex DM twice daily.  May use  Endal HD, #8 ounces, 1-2 teaspoons every 4-6 hours as needed.  The patient  is aware of sedating effect.  The patient was given a Xopenex nebulizer  treatment in the office.  The patient will continue with his current  pulmonary regimen and follow back up with Dr. Maple Hudson as scheduled or sooner  if needed.     ______________________________  Rubye Oaks, NP    ______________________________  Rennis Chris. Maple Hudson, MD, [facility], FACP    TP/MedQ  DD:  03/07/2006  DT:  03/08/2006  Job #:  [id number]

## 2010-10-27 NOTE — Op Note (Signed)
NAME:  [name], [name] NO.:  [id number]   MEDICAL RECORD NO.:  [id number]                   PATIENT TYPE:  AMB   LOCATION:  ENDO                                 FACILITY:  [facility]   PHYSICIAN:  Georgiana Spinner, M.D.                 DATE OF BIRTH:  [birth year]   DATE OF PROCEDURE:  08/11/2003  DATE OF DISCHARGE:                                 OPERATIVE REPORT   PROCEDURE PERFORMED:  Upper endoscopy.   ENDOSCOPIST:  Georgiana Spinner, M.D.   INDICATIONS FOR PROCEDURE:  Gastroesophageal reflux disease.   ANESTHESIA:  Demerol 60 mg, Versed 6 mg.   DESCRIPTION OF PROCEDURE:  With the patient mildly sedated in the left  lateral decubitus position, the Olympus video endoscope was inserted in the  mouth and passed under direct vision through the esophagus.  The distal  esophagus was approached and biopsied.  We entered into the stomach.  The  fundus, body, antrum, duodenal bulb and second portion of the duodenum all  appeared normal.  From this point, the endoscope was slowly withdrawn taking  circumferential views of the duodenal mucosa until the endoscope was pulled  back into the stomach and placed on retroflexion to view the stomach from  below.  The esophagus could be seen on retroflex view indicating a widely  patent gastroesophageal junction.  The endoscope was then straightened and  withdrawn taking circumferential views of the remaining gastric and  esophageal mucosa.  The patient's vital signs and pulse oximeter remained  stable.  The patient tolerated the procedure well without apparent  complications.   FINDINGS:  Question of Barrett's esophagus biopsied.  Await biopsy report.  Patient will call me for results and follow up with me as outpatient.  Proceed to colonoscopy.                                               Georgiana Spinner, M.D.    GMO/MEDQ  D:  08/11/2003  T:  08/11/2003  Job:  [id number]

## 2010-10-27 NOTE — Procedures (Signed)
[facility]  Patient:    [name], [name] Visit Number: [id number] MRN: [id number]          Service Type: END Location: ENDO Attending Physician:  Sabino Gasser Dictated by:   Sabino Gasser, M.D. Proc. Date: 10/01/01 Admit Date:  10/01/2001                             Procedure Report  PROCEDURE:  Upper endoscopy with biopsy.  INDICATION FOR PROCEDURE:  Gastroesophageal reflux disease.  ANESTHESIA:  Demerol 60, Versed 5 mg.  DESCRIPTION OF PROCEDURE:  With the patient mildly sedated in the left lateral decubitus position, the Olympus videoscopic endoscope was inserted in the mouth and passed under direct vision through the esophagus. The distal esophagus was approached and the Z line was seen. There were some short areas that could have been related to Barretts esophagus photographed and biopsied. We entered into the the stomach. The fundus, body, antrum, duodenal bulb and second portion of the duodenum all appeared normal. From this point, the endoscope was slowly withdrawn taking circumferential views of the entire duodenal mucosa until the endoscope was then pulled back into the stomach, placed in retroflexion to view the stomach from below. The endoscope was then straightened and withdrawn taking circumferential views of the entire gastric and esophageal mucosa stopping as noted to biopsy the distal esophagus and there was an area in the proximal esophagus which was biopsied as well. The patients vital signs and pulse oximeter remained stable. The patient tolerated the procedure well without apparent complications.  FINDINGS:  Question of Barretts esophagus in the distal esophagus, await biopsy report. Abnormality in proximal esophagus of unknown cause, await biopsy report. The patient will call me for results and followup with me as an outpatient. Dictated by:   Sabino Gasser, M.D. Attending Physician:  Sabino Gasser DD:  10/01/01 TD:   10/01/01 Job: [id number]

## 2010-10-27 NOTE — Discharge Summary (Signed)
NAME:  [name], [name]   MEDICAL RECORD NO.:  [id number]          PATIENT TYPE:  IPS   LOCATION:  4037                         FACILITY:  [facility]   PHYSICIAN:  Ranelle Oyster, M.D.DATE OF BIRTH:  [birth year]   DATE OF ADMISSION:  04/14/2004  DATE OF DISCHARGE:  04/19/2004                                 DISCHARGE SUMMARY   DISCHARGE DIAGNOSES:  1.  Decompressive lumbar laminectomy for spondylosis stenosis April 06, 2004.  2.  Pain management.  3.  Thigh high TED stockings for deep venous thrombosis.  4.  Hypertension.  5.  Benign prostatic hypertrophy.   HISTORY OF PRESENT ILLNESS:  A 75 year old white male admitted to [facility] April 06, 2004, with advanced low back pain.  X-rays  and imaging with lumbar degenerative disc disease, lumbar stenosis with  spondylosis.  He underwent decompressive lumbar laminectomy and  foraminotomies L2-3, 3-4 and 4-5 with TLIF and pedicle screw fixation L2-5  April 06, 2004, per Dr. Octavio Graves.  Postoperative pain control, Demerol  discontinued secondary to confusion.  Sutures removed.  Steri-Strips in  place.  Voiding without difficulty. Admitted for a comprehensive rehab  program.   ALLERGIES:  PENICILLIN.   MEDICATIONS PRIOR TO ADMISSION:  1.  Nexium.  2.  Flomax.  3.  Neurontin.  4.  Hydrochlorothiazide.  5.  Advair.   PAST MEDICAL HISTORY:  See discharge diagnoses.   SOCIAL HISTORY:  Denies alcohol or tobacco.  He [place] in a two level home with three steps to entry.  Bedroom downstairs.  Wife is a Designer, jewellery with a school system.  No  local family.   HOSPITAL COURSE:  The patient with progressive gains while in rehab services  with therapies initiated on a b.i.d. basis.  The following issues are  followed during the patient's rehab course.  Pertaining to Mr. Dockstader's  decompressive lumbar laminectomy, surgical site  healing nicely.  Steri-  Strips in place.  He was ambulating supervision with a straight point cane.  Neurovascular sensation remained intact.  His calves remained cool without  any swelling, erythema and nontender.  Thigh high TED stockings remained in  place.  Pain management ongoing with the use of Neurontin as well as  Darvocet.  His Darvocet was offering only minimal relief, thus he was  changed to Vicodin one tablet every four hours as needed with good results.  His blood pressures remained controlled with hydrochlorothiazide.  He was  voiding without difficulty with documented history of benign prostatic  hypertrophy.  He remained on Flomax.  He had no bowel issues.  He was  discharged home with his wife.  He was advised no driving at this time and  follow-up with Dr. Octavio Graves, neurosurgery, [facility].   Latest labs showed a sodium 139, potassium 3.7, BUN 10, creatinine 1.1.  Hemoglobin 12.1, hematocrit 34.7.   DISCHARGE MEDICATIONS:  1.  Flomax 0.4 mg at bedtime.  2.  Neurontin 300 mg at bedtime.  3.  Hydrochlorothiazide 25 mg  daily.  4.  Advair 250/50 Diskus one puff twice daily.  5.  Nexium 40 mg daily.  6.  Vicodin 5/500 one tablet q.4h. as needed for pain.   ACTIVITY:  As tolerated with back precautions.   DIET:  Regular.   WOUND CARE:  Cleans incision daily with soap and water.   SPECIAL INSTRUCTIONS:  No driving, no smoking, no alcohol.  Follow up with  Dr. Octavio Graves, neurosurgery, [facility].       DA/MEDQ  D:  04/18/2004  T:  04/18/2004  Job:  [id number]   cc:   Soyla Murphy. Renne Crigler, M.D.  [address]  [place]  [place] [address]  Fax: [phone]   Maretta Bees. Vonita Moss, M.D.  [address]  [place]  [place] [address]  Fax: [phone]

## 2010-10-27 NOTE — Assessment & Plan Note (Signed)
[facility]                               PULMONARY OFFICE NOTE   NAME:[name], [name]                     MRN:          [id number]  DATE:01/16/2006                            DOB:          1936/06/06    PROBLEM:  1. Allergic rhinitis.  2. Asthma.  3. Obstructive sleep apnea (minimal).  4. Esophageal reflux.   HISTORY:  [name] was last seen by me in January, at which time he had  had an acute bronchitis syndrome.  That resolved and he had done well  through the spring and mid-summer.  Yesterday, he came by, hoping to be  worked in, because of malaise, congestion and shortness of breath, but he  had a urinary infection with fever and he left our office without being seen  to get over to the urology office.  He is now on Septra and he says his  temperature is coming down.  Because of his chest congestion, he restarted  Advair 250 and albuterol, which he had not felt he needed for at least the  past six weeks, or so.  He is more aware now, on direct questioning, of an  active reflux with occasional minor aspiration.  This has hit him especially  a couple of times at night.  We discussed reflux precautions, interaction  with respiratory problems and therapy.  He has seen Dr. Sabino Gasser and has  had upper endoscopy in the past.  We discussed his allergy vaccine, risks,  goals and related issues.  He has had an occasional small local reaction,  nothing more.  He understands anaphylaxis, having seen it in a daughter, by  his description.  He gives his own vaccine at home, as he has done for  years.  He very much wishes to continue with this pattern.  We discussed  policy and issues concerning administration of vaccine outside of medical  office, anaphylaxis, epinephrine management of reactions and alternatives.  He reviewed and chose to sign the waiver, seeking to continue to give his  own vaccine, as before.   MEDICATION:  1. Advair 250/50.  2. Lyrica 75 mg (back pain).  3. HCTZ.  4. Darvocet.  5. Flomax 0.4 mg.  6. Nexium 40 mg.  7. Oxycodone p.r.n.  8. Mucinex-DM.  9. Echinacea.  10.Epi-Pen.   ALLERGIES:  Drug intolerant to PENICILLIN, CODEINE.   OBJECTIVE:  VITAL SIGNS:  Weight 187 pounds, BP 102/72, pulse regular, 95.  Room air saturation 94%.  GENERAL:  He does not look uncomfortable, but demonstrates some dry cough,  without wheeze, dullness or increased work of breathing.  CARDIOVASCULAR:  Heart sounds are regular without murmur or gallop.  There  is no neck vein distention, no edema or cyanosis.   IMPRESSION:  1. Asthma with recent exacerbation.  2. Reflux.  3. Urinary infection.  4. Minimal sleep apnea, managed by sleeping off flat of back and weight      control.   PLAN:  1. Medications were refilled.  2. Nebulizer treatment today, Xopenex 1.25 mg.  3. Epinephrine was refilled and waiver signed.  4. Schedule return in one year, earlier p.r.n.                                   Clinton D. Maple Hudson, MD, FCCP, FACP   CDY/MedQ  DD:  01/18/2006  DT:  01/18/2006  Job #:  [id number]   cc:   Soyla Murphy. Renne Crigler, MD

## 2010-10-27 NOTE — Op Note (Signed)
NAME:  [name], DEVAN NO.:  [id number]   MEDICAL RECORD NO.:  [id number]          PATIENT TYPE:  AMB   LOCATION:  ENDO                         FACILITY:  [facility]   PHYSICIAN:  Georgiana Spinner, M.D.    DATE OF BIRTH:  September 28, 1935   DATE OF PROCEDURE:  08/08/2005  DATE OF DISCHARGE:                                 OPERATIVE REPORT   PROCEDURE:  Upper endoscopy.   INDICATIONS:  Gastroesophageal reflux disease.   ANESTHESIA:  Demerol 25, Versed 4 mg.   PROCEDURE:  With the patient mildly sedated in the left lateral decubitus  position, the Olympus videoscopic endoscope was inserted in the mouth and  passed under direct vision through the esophagus which appeared normal. I  photographed the squamocolumnar junction and there was at this point not a  clear delineated area of Barrett's that I could see but I elected to biopsy  around the border.  We then entered into the stomach. The fundus, body,  antrum, duodenal bulb, and second portion of duodenum were visualized. From  this point, the endoscope was slowly withdrawn taking circumferential views  of the duodenal mucosa until the endoscope had been pulled back into the  stomach and placed in retroflexion to view the stomach from below. The  endoscope was then straightened and withdrawn taking circumferential views  of the remaining gastric and esophageal mucosa. The patient's vital signs  and pulse oximeter remained stable. The patient tolerated the procedure well  without apparent complications.   FINDINGS:  Rather unremarkable examination. Await biopsy report. The patient  will call me for results and follow-up with me as an outpatient. Proceed to  colonoscopy.           ______________________________  Georgiana Spinner, M.D.     GMO/MEDQ  D:  08/08/2005  T:  08/08/2005  Job:  [id number]

## 2010-12-04 ENCOUNTER — Encounter (INDEPENDENT_AMBULATORY_CARE_PROVIDER_SITE_OTHER): Payer: Self-pay | Admitting: Surgery

## 2010-12-05 ENCOUNTER — Ambulatory Visit (INDEPENDENT_AMBULATORY_CARE_PROVIDER_SITE_OTHER): Payer: Medicare Other | Admitting: Surgery

## 2010-12-05 ENCOUNTER — Encounter (INDEPENDENT_AMBULATORY_CARE_PROVIDER_SITE_OTHER): Payer: Self-pay | Admitting: Surgery

## 2010-12-05 VITALS — BP 119/76 | Temp 97.0°F | Resp 77 | Ht 68.0 in | Wt 192.8 lb

## 2010-12-05 DIAGNOSIS — M6200 Separation of muscle (nontraumatic), unspecified site: Secondary | ICD-10-CM

## 2010-12-05 DIAGNOSIS — M6208 Separation of muscle (nontraumatic), other site: Secondary | ICD-10-CM

## 2010-12-05 NOTE — Progress Notes (Signed)
Subjective:     Patient ID: [name], male   DOB: 28-Jul-1935, 75 y.o.   MRN: [id number]    BP 119/76  Temp(Src) 97 F (36.1 C) (Temporal)  Resp 77  Ht 5\' 8"  (1.727 m)  Wt 192 lb 12.8 oz (87.454 kg)  BMI 29.32 kg/m2    HPI This is a pleasant 75 year old gentleman referred by his orthopedist for evaluation of a possible ventral hernia. The patient has no abdominal discomfort whatsoever. He only notices a mass when he raises his head and flexes his abdomen.  Review of Systems  Constitutional: Negative.   Eyes: Negative.   Respiratory: Negative.   Cardiovascular: Negative.   Gastrointestinal: Negative.   Genitourinary: Negative.   Musculoskeletal: Negative.   Neurological: Positive for headaches.  Hematological: Negative.   Psychiatric/Behavioral: Negative.        Objective:   Physical Exam Exam was limited to his abdomen. The patient has rectus diastases without evidence of a ventral hernia. He has a very tiny defect at his umbilicus which is asymptomatic.    Assessment:    Rectus diastases    Plan:       I explained the diagnosis of the patient. No surgical intervention is necessary. He may resume his normal activities. I will see him as needed.

## 2010-12-11 ENCOUNTER — Ambulatory Visit: Payer: Self-pay | Admitting: Internal Medicine

## 2011-01-04 ENCOUNTER — Other Ambulatory Visit: Payer: Self-pay | Admitting: Gastroenterology

## 2011-01-30 ENCOUNTER — Other Ambulatory Visit: Payer: Self-pay | Admitting: Internal Medicine

## 2011-05-14 ENCOUNTER — Ambulatory Visit: Payer: Self-pay | Admitting: Internal Medicine

## 2011-06-13 DIAGNOSIS — C44320 Squamous cell carcinoma of skin of unspecified parts of face: Secondary | ICD-10-CM | POA: Diagnosis not present

## 2011-06-13 DIAGNOSIS — M949 Disorder of cartilage, unspecified: Secondary | ICD-10-CM | POA: Diagnosis not present

## 2011-06-13 DIAGNOSIS — Z79899 Other long term (current) drug therapy: Secondary | ICD-10-CM | POA: Diagnosis not present

## 2011-06-13 DIAGNOSIS — M899 Disorder of bone, unspecified: Secondary | ICD-10-CM | POA: Diagnosis not present

## 2011-06-18 DIAGNOSIS — S239XXA Sprain of unspecified parts of thorax, initial encounter: Secondary | ICD-10-CM | POA: Diagnosis not present

## 2011-06-25 DIAGNOSIS — S239XXA Sprain of unspecified parts of thorax, initial encounter: Secondary | ICD-10-CM | POA: Diagnosis not present

## 2011-07-02 DIAGNOSIS — S239XXA Sprain of unspecified parts of thorax, initial encounter: Secondary | ICD-10-CM | POA: Diagnosis not present

## 2011-07-09 DIAGNOSIS — S239XXA Sprain of unspecified parts of thorax, initial encounter: Secondary | ICD-10-CM | POA: Diagnosis not present

## 2011-07-16 DIAGNOSIS — S239XXA Sprain of unspecified parts of thorax, initial encounter: Secondary | ICD-10-CM | POA: Diagnosis not present

## 2011-07-21 DIAGNOSIS — S239XXA Sprain of unspecified parts of thorax, initial encounter: Secondary | ICD-10-CM | POA: Diagnosis not present

## 2011-07-23 DIAGNOSIS — S239XXA Sprain of unspecified parts of thorax, initial encounter: Secondary | ICD-10-CM | POA: Diagnosis not present

## 2011-07-30 DIAGNOSIS — S239XXA Sprain of unspecified parts of thorax, initial encounter: Secondary | ICD-10-CM | POA: Diagnosis not present

## 2011-08-06 DIAGNOSIS — I10 Essential (primary) hypertension: Secondary | ICD-10-CM | POA: Diagnosis not present

## 2011-08-06 DIAGNOSIS — J45909 Unspecified asthma, uncomplicated: Secondary | ICD-10-CM | POA: Diagnosis not present

## 2011-08-27 DIAGNOSIS — M719 Bursopathy, unspecified: Secondary | ICD-10-CM | POA: Diagnosis not present

## 2011-08-27 DIAGNOSIS — S139XXA Sprain of joints and ligaments of unspecified parts of neck, initial encounter: Secondary | ICD-10-CM | POA: Diagnosis not present

## 2011-08-27 DIAGNOSIS — M67919 Unspecified disorder of synovium and tendon, unspecified shoulder: Secondary | ICD-10-CM | POA: Diagnosis not present

## 2011-09-03 DIAGNOSIS — S139XXA Sprain of joints and ligaments of unspecified parts of neck, initial encounter: Secondary | ICD-10-CM | POA: Diagnosis not present

## 2011-09-03 DIAGNOSIS — M67919 Unspecified disorder of synovium and tendon, unspecified shoulder: Secondary | ICD-10-CM | POA: Diagnosis not present

## 2011-09-10 DIAGNOSIS — S139XXA Sprain of joints and ligaments of unspecified parts of neck, initial encounter: Secondary | ICD-10-CM | POA: Diagnosis not present

## 2011-09-10 DIAGNOSIS — M67919 Unspecified disorder of synovium and tendon, unspecified shoulder: Secondary | ICD-10-CM | POA: Diagnosis not present

## 2011-09-10 DIAGNOSIS — M719 Bursopathy, unspecified: Secondary | ICD-10-CM | POA: Diagnosis not present

## 2011-09-17 DIAGNOSIS — M719 Bursopathy, unspecified: Secondary | ICD-10-CM | POA: Diagnosis not present

## 2011-09-17 DIAGNOSIS — S139XXA Sprain of joints and ligaments of unspecified parts of neck, initial encounter: Secondary | ICD-10-CM | POA: Diagnosis not present

## 2011-09-20 ENCOUNTER — Other Ambulatory Visit: Payer: Self-pay | Admitting: Urology

## 2011-09-20 DIAGNOSIS — N433 Hydrocele, unspecified: Secondary | ICD-10-CM | POA: Diagnosis not present

## 2011-09-21 DIAGNOSIS — H1045 Other chronic allergic conjunctivitis: Secondary | ICD-10-CM | POA: Diagnosis not present

## 2011-09-21 DIAGNOSIS — H0019 Chalazion unspecified eye, unspecified eyelid: Secondary | ICD-10-CM | POA: Diagnosis not present

## 2011-09-21 DIAGNOSIS — H01009 Unspecified blepharitis unspecified eye, unspecified eyelid: Secondary | ICD-10-CM | POA: Diagnosis not present

## 2011-09-24 DIAGNOSIS — M67919 Unspecified disorder of synovium and tendon, unspecified shoulder: Secondary | ICD-10-CM | POA: Diagnosis not present

## 2011-09-24 DIAGNOSIS — S139XXA Sprain of joints and ligaments of unspecified parts of neck, initial encounter: Secondary | ICD-10-CM | POA: Diagnosis not present

## 2011-09-24 DIAGNOSIS — M719 Bursopathy, unspecified: Secondary | ICD-10-CM | POA: Diagnosis not present

## 2011-10-03 DIAGNOSIS — M76899 Other specified enthesopathies of unspecified lower limb, excluding foot: Secondary | ICD-10-CM | POA: Diagnosis not present

## 2011-10-04 ENCOUNTER — Encounter (HOSPITAL_BASED_OUTPATIENT_CLINIC_OR_DEPARTMENT_OTHER): Payer: Self-pay | Admitting: *Deleted

## 2011-10-04 NOTE — H&P (Signed)
Patient presents today for a left hydrocele repair with history summarized below.  Reason For Visit  Acute evaluation of left scrotal swelling   History of Present Illness     76 y/o white male with h/o BPH with BOO and elevated PSA presents for acute evaluation of swelling in the left scrotum.  He reports that this has been progressively enlarging over the past year and is now to the point that it is cumbersome and interfering with his daily activities.  He remains quite active working out at [employer] 7 days a week and golfing several days a week.  The scrotum and testicles are not tender.  He denies erythema or drainage.  He has not had any recent injury or trauma.  He denies all associated LUTS- specifically dysuria, gross hematuria, flank pain, fever or chills.  He takes Flomax 2 capsules daily for BPH and this helps with his longstanding frequency, nocturia and urgency.  PSA 05/2011 was 3.47 which is stable.   Past Medical History Problems  1. History of  Asthma 493.90 2. History of  Sciatica 724.3  Surgical History Problems  1. History of  Vertebral Body Resection Cervical Segment Single  Current Meds 1. Acidophilus CAPS; Therapy: (Recorded:11Apr2013) to 2. Aspirin 81 MG Oral Tablet; Therapy: (Recorded:11Apr2013) to 3. Hydrochlorothiazide CAPS; Therapy: (Recorded:12Jan2009) to 4. Omeprazole 20 MG Oral Tablet Delayed Release; Therapy: (Recorded:12Jan2009) to 5. Saw Palmetto CAPS; Therapy: (Recorded:11Apr2013) to 6. Tamsulosin HCl 0.4 MG Oral Capsule; TAKE 2 CAPSULE Daily; Therapy: 02Feb2011 to  (Evaluate:03Mar2014)  Requested for: 08Mar2013; Last Rx:08Mar2013 7. Vitamin C TABS; Therapy: (Recorded:11Apr2013) to 8. Vitamin D CAPS; Therapy: (Recorded:11Apr2013) to  Allergies Medication  1. Codeine Derivatives 2. Penicillins Non-Medication  3. Adhesive Tape  Social History Problems  1. Alcohol Use occassional 2. Caffeine Use occassional 3. Former Smoker V15.82 4. Marital  History - Currently Married 5. Occupation: retired Denied  6. History of  Former Smoker  Review of Systems Genitourinary, constitutional, skin, eye, otolaryngeal, hematologic/lymphatic, cardiovascular, pulmonary, endocrine, musculoskeletal, gastrointestinal, neurological and psychiatric system(s) were reviewed and pertinent findings if present are noted.  Genitourinary: urinary frequency, feelings of urinary urgency and scrotal swelling.    Vitals Vital Signs [Data Includes: Last 1 Day]  11Apr2013 08:27AM  Blood Pressure: 111 / 70 Temperature: 97.6 F Heart Rate: 69  Physical Exam Constitutional: Well nourished and well developed . No acute distress.  ENT:. The ears and nose are normal in appearance.  Neck: The appearance of the neck is normal and no neck mass is present.  Pulmonary: No respiratory distress and normal respiratory rhythm and effort.  Cardiovascular:. No peripheral edema.  Genitourinary: Examination of the penis demonstrates no lesions and a normal meatus. The penis is circumcised. The scrotum is edematous, but not tender. Examination of the right scrotum demonstrates no hydrocele. Examination of the left scrotum demostrates a hydrocele. The right epididymis is palpably normal. The left epididymis is palpably normal. The right testis is palpably normal. The left testis is normal.  Skin: Normal skin turgor, no visible rash and no visible skin lesions.  Neuro/Psych:. Mood and affect are appropriate.    Results/Data Urine [Data Includes: Last 1 Day]   11Apr2013  COLOR YELLOW   APPEARANCE CLEAR   SPECIFIC GRAVITY 1.015   pH 6.0   GLUCOSE NEG mg/dL  BILIRUBIN NEG   KETONE NEG mg/dL  BLOOD NEG   PROTEIN NEG mg/dL  UROBILINOGEN 0.2 mg/dL  NITRITE NEG   LEUKOCYTE ESTERASE NEG    The following  clinical lab reports were reviewed: Marland Kitchen  Urinalysis Selected Results  UA With REFLEX 11Apr2013 08:15AM Bruning, Ashlyn   Test Name Result Flag Reference  COLOR YELLOW  YELLOW    APPEARANCE CLEAR  CLEAR  SPECIFIC GRAVITY 1.015  1.005-1.030  pH 6.0  5.0-8.0  GLUCOSE NEG mg/dL  NEG  BILIRUBIN NEG  NEG  KETONE NEG mg/dL  NEG  BLOOD NEG  NEG  PROTEIN NEG mg/dL  NEG  UROBILINOGEN 0.2 mg/dL  1.1-9.1  NITRITE NEG  NEG  LEUKOCYTE ESTERASE NEG  NEG   Assessment Assessed  1. Hydrocele Left 603.9 2. Benign Prostatic Hypertrophy With Urinary Obstruction 600.01 3. PSA,Elevated 790.93  Plan Health Maintenance (V70.0)  1. UA With REFLEX  Done: 11Apr2013 08:15AM Urge Incontinence Of Urine (788.31)  2. VESIcare 5 MG Oral Tablet; Take 1 tablet daily; Therapy: 03Dec2012 to 11Apr2013; Last  Rx:03Dec2012; Status: DISCONTINUED   Patient was seen by Dr. Isabel Caprice today as well.  Dr. Isabel Caprice agrees with stated assessment and plan. Patient would like to have his moderately large left hydrocele surgically excised, understanding that this is an elective procedure as the hydrocele is not dangerous itself. Risks and benefits discussed with pt.  Pt gives informed consent. Orders completed.   Signatures

## 2011-10-04 NOTE — Progress Notes (Signed)
NPO AFTER MN. ARRIVES AT 0615. NEEDS ISTAT AND EKG. WILL TAKE PRILOSEC AM OF SURG. W/ SIP OF WATER.

## 2011-10-08 ENCOUNTER — Ambulatory Visit (HOSPITAL_BASED_OUTPATIENT_CLINIC_OR_DEPARTMENT_OTHER): Payer: Medicare Other | Admitting: Anesthesiology

## 2011-10-08 ENCOUNTER — Encounter (HOSPITAL_BASED_OUTPATIENT_CLINIC_OR_DEPARTMENT_OTHER)
Admission: RE | Disposition: A | Discharge status: Home / Self Care | Payer: Self-pay | Source: Ambulatory Visit | Attending: Urology

## 2011-10-08 ENCOUNTER — Encounter (HOSPITAL_BASED_OUTPATIENT_CLINIC_OR_DEPARTMENT_OTHER): Payer: Self-pay | Admitting: Anesthesiology

## 2011-10-08 ENCOUNTER — Ambulatory Visit (HOSPITAL_BASED_OUTPATIENT_CLINIC_OR_DEPARTMENT_OTHER)
Admission: RE | Admit: 2011-10-08 | Discharge: 2011-10-08 | Disposition: A | Discharge status: Home / Self Care | Payer: Medicare Other | Source: Ambulatory Visit | Attending: Urology | Admitting: Urology

## 2011-10-08 ENCOUNTER — Encounter (HOSPITAL_BASED_OUTPATIENT_CLINIC_OR_DEPARTMENT_OTHER): Payer: Self-pay | Admitting: *Deleted

## 2011-10-08 DIAGNOSIS — Z79899 Other long term (current) drug therapy: Secondary | ICD-10-CM | POA: Insufficient documentation

## 2011-10-08 DIAGNOSIS — N138 Other obstructive and reflux uropathy: Secondary | ICD-10-CM | POA: Insufficient documentation

## 2011-10-08 DIAGNOSIS — J45909 Unspecified asthma, uncomplicated: Secondary | ICD-10-CM | POA: Insufficient documentation

## 2011-10-08 DIAGNOSIS — R972 Elevated prostate specific antigen [PSA]: Secondary | ICD-10-CM | POA: Insufficient documentation

## 2011-10-08 DIAGNOSIS — N401 Enlarged prostate with lower urinary tract symptoms: Secondary | ICD-10-CM | POA: Diagnosis not present

## 2011-10-08 DIAGNOSIS — Z7982 Long term (current) use of aspirin: Secondary | ICD-10-CM | POA: Diagnosis not present

## 2011-10-08 DIAGNOSIS — N433 Hydrocele, unspecified: Secondary | ICD-10-CM | POA: Insufficient documentation

## 2011-10-08 HISTORY — DX: Personal history of urinary calculi: Z87.442

## 2011-10-08 HISTORY — DX: Personal history of other diseases of the nervous system and sense organs: Z86.69

## 2011-10-08 HISTORY — DX: Reserved for inherently not codable concepts without codable children: IMO0001

## 2011-10-08 HISTORY — DX: Sleep apnea, unspecified: G47.30

## 2011-10-08 HISTORY — DX: Unspecified asthma, uncomplicated: J45.909

## 2011-10-08 HISTORY — DX: Personal history of other diseases of the digestive system: Z87.19

## 2011-10-08 HISTORY — DX: Personal history of other diseases of the respiratory system: Z87.09

## 2011-10-08 HISTORY — PX: HYDROCELE EXCISION: SHX482

## 2011-10-08 HISTORY — DX: Frequency of micturition: R35.0

## 2011-10-08 HISTORY — DX: Essential (primary) hypertension: I10

## 2011-10-08 LAB — POCT I-STAT, CHEM 8
Calcium, Ion: 1.26 mmol/L (ref 1.12–1.32)
Creatinine, Ser: 1 mg/dL (ref 0.50–1.35)
Glucose, Bld: 106 mg/dL — ABNORMAL HIGH (ref 70–99)
Hemoglobin: 13.9 g/dL (ref 13.0–17.0)
Potassium: 4 mEq/L (ref 3.5–5.1)
TCO2: 24 mmol/L (ref 0–100)

## 2011-10-08 SURGERY — HYDROCELECTOMY
Anesthesia: General | Site: Scrotum | Laterality: Left | Wound class: Clean

## 2011-10-08 MED ORDER — CIPROFLOXACIN IN D5W 400 MG/200ML IV SOLN
400.0000 mg | INTRAVENOUS | Status: AC
  Administered 2011-10-08: 400 mg via INTRAVENOUS

## 2011-10-08 MED ORDER — LACTATED RINGERS IV SOLN
INTRAVENOUS | Status: DC
  Administered 2011-10-08: 07:00:00 via INTRAVENOUS

## 2011-10-08 MED ORDER — PROPOFOL 10 MG/ML IV EMUL
INTRAVENOUS | Status: DC | PRN
  Administered 2011-10-08: 160 mg via INTRAVENOUS

## 2011-10-08 MED ORDER — BUPIVACAINE HCL (PF) 0.25 % IJ SOLN
INTRAMUSCULAR | Status: DC | PRN
  Administered 2011-10-08: 6 mL

## 2011-10-08 MED ORDER — ONDANSETRON HCL 4 MG/2ML IJ SOLN
INTRAMUSCULAR | Status: DC | PRN
  Administered 2011-10-08: 4 mg via INTRAVENOUS

## 2011-10-08 MED ORDER — FENTANYL CITRATE 0.05 MG/ML IJ SOLN
INTRAMUSCULAR | Status: DC | PRN
  Administered 2011-10-08: 50 ug via INTRAVENOUS
  Administered 2011-10-08 (×2): 25 ug via INTRAVENOUS

## 2011-10-08 MED ORDER — HYDROCODONE-ACETAMINOPHEN 5-325 MG PO TABS
1.0000 | ORAL_TABLET | ORAL | Status: DC | PRN
  Administered 2011-10-08: 1 via ORAL

## 2011-10-08 MED ORDER — FENTANYL CITRATE 0.05 MG/ML IJ SOLN: 25.0000 ug | INTRAMUSCULAR | Status: DC | PRN

## 2011-10-08 MED ORDER — DEXAMETHASONE SODIUM PHOSPHATE 4 MG/ML IJ SOLN
INTRAMUSCULAR | Status: DC | PRN
  Administered 2011-10-08: 8 mg via INTRAVENOUS

## 2011-10-08 MED ORDER — MIDAZOLAM HCL 5 MG/5ML IJ SOLN
INTRAMUSCULAR | Status: DC | PRN
  Administered 2011-10-08: 2 mg via INTRAVENOUS

## 2011-10-08 MED ORDER — LIDOCAINE HCL (CARDIAC) 20 MG/ML IV SOLN
INTRAVENOUS | Status: DC | PRN
  Administered 2011-10-08: 60 mg via INTRAVENOUS

## 2011-10-08 MED ORDER — HYDROCODONE-ACETAMINOPHEN 5-325 MG PO TABS: 1.0000 | ORAL_TABLET | Freq: Four times a day (QID) | ORAL | Status: AC | PRN

## 2011-10-08 SURGICAL SUPPLY — 46 items
APL SKNCLS STERI-STRIP NONHPOA (GAUZE/BANDAGES/DRESSINGS) ×1
BANDAGE GAUZE ELAST BULKY 4 IN (GAUZE/BANDAGES/DRESSINGS) ×2 IMPLANT
BENZOIN TINCTURE PRP APPL 2/3 (GAUZE/BANDAGES/DRESSINGS) ×2 IMPLANT
BLADE SURG 10 STRL SS (BLADE) ×2 IMPLANT
BLADE SURG 15 STRL LF DISP TIS (BLADE) ×1 IMPLANT
BLADE SURG 15 STRL SS (BLADE) ×2
BLADE SURG ROTATE 9660 (MISCELLANEOUS) IMPLANT
CANISTER SUCTION 1200CC (MISCELLANEOUS) IMPLANT
CANISTER SUCTION 2500CC (MISCELLANEOUS) IMPLANT
CLEANER CAUTERY TIP 5X5 PAD (MISCELLANEOUS) ×1 IMPLANT
CLOTH BEACON ORANGE TIMEOUT ST (SAFETY) ×2 IMPLANT
COVER MAYO STAND STRL (DRAPES) ×2 IMPLANT
COVER TABLE BACK 60X90 (DRAPES) ×2 IMPLANT
DRAIN PENROSE 18X1/4 LTX STRL (WOUND CARE) IMPLANT
DRAPE PED LAPAROTOMY (DRAPES) ×2 IMPLANT
DRSG TEGADERM 2-3/8X2-3/4 SM (GAUZE/BANDAGES/DRESSINGS) ×2 IMPLANT
ELECT NDL TIP 2.8 STRL (NEEDLE) ×1 IMPLANT
ELECT NEEDLE TIP 2.8 STRL (NEEDLE) ×2 IMPLANT
ELECT REM PT RETURN 9FT ADLT (ELECTROSURGICAL) ×2
ELECTRODE REM PT RTRN 9FT ADLT (ELECTROSURGICAL) ×1 IMPLANT
GLOVE BIO SURGEON STRL SZ7.5 (GLOVE) ×2 IMPLANT
GLOVE ECLIPSE 6.5 STRL STRAW (GLOVE) ×1 IMPLANT
GLOVE INDICATOR 6.5 STRL GRN (GLOVE) ×2 IMPLANT
GOWN W/COTTON TOWEL STD LRG (GOWNS) ×2 IMPLANT
GOWN XL W/COTTON TOWEL STD (GOWNS) ×2 IMPLANT
NDL HYPO 25X1 1.5 SAFETY (NEEDLE) ×1 IMPLANT
NEEDLE HYPO 25X1 1.5 SAFETY (NEEDLE) ×2 IMPLANT
NS IRRIG 500ML POUR BTL (IV SOLUTION) ×2 IMPLANT
PACK BASIN DAY SURGERY FS (CUSTOM PROCEDURE TRAY) ×2 IMPLANT
PAD CLEANER CAUTERY TIP 5X5 (MISCELLANEOUS) ×1
PENCIL BUTTON HOLSTER BLD 10FT (ELECTRODE) ×2 IMPLANT
SUPPORT SCROTAL LG STRP (MISCELLANEOUS) IMPLANT
SUT SILK 3 0 SH 30 (SUTURE) IMPLANT
SUT VIC AB 3-0 SH 27 (SUTURE) ×2
SUT VIC AB 3-0 SH 27X BRD (SUTURE) IMPLANT
SUT VIC AB 4-0 SH 27 (SUTURE)
SUT VIC AB 4-0 SH 27XANBCTRL (SUTURE) IMPLANT
SUT VIC AB 5-0 P-3 18X BRD (SUTURE) IMPLANT
SUT VIC AB 5-0 P3 18 (SUTURE)
SUT VICRYL 4-0 PS2 18IN ABS (SUTURE) IMPLANT
SUT VICRYL RAPIDE 4/0 PS 2 (SUTURE) ×3 IMPLANT
SYR CONTROL 10ML LL (SYRINGE) ×2 IMPLANT
TOWEL OR 17X24 6PK STRL BLUE (TOWEL DISPOSABLE) ×4 IMPLANT
TRAY DSU PREP LF (CUSTOM PROCEDURE TRAY) ×2 IMPLANT
TUBE CONNECTING 12X1/4 (SUCTIONS) ×2 IMPLANT
YANKAUER SUCT BULB TIP NO VENT (SUCTIONS) ×2 IMPLANT

## 2011-10-08 NOTE — Anesthesia Procedure Notes (Signed)
Procedure Name: LMA Insertion Date/Time: 10/08/2011 7:43 AM Performed by: Renella Cunas D Pre-anesthesia Checklist: Patient identified, Emergency Drugs available, Suction available and Patient being monitored Patient Re-evaluated:Patient Re-evaluated prior to inductionOxygen Delivery Method: Circle System Utilized Preoxygenation: Pre-oxygenation with 100% oxygen Intubation Type: IV induction Ventilation: Mask ventilation without difficulty LMA: LMA inserted LMA Size: 4.0 Number of attempts: 1 Airway Equipment and Method: bite block Placement Confirmation: positive ETCO2 Tube secured with: Tape Dental Injury: Teeth and Oropharynx as per pre-operative assessment

## 2011-10-08 NOTE — Interval H&P Note (Signed)
History and Physical Interval Note:  10/08/2011 7:27 AM  [name]  has presented today for surgery, with the diagnosis of LEFT HYDROCELE  The various methods of treatment have been discussed with the patient and family. After consideration of risks, benefits and other options for treatment, the patient has consented to  Procedure(s) (LRB): HYDROCELECTOMY ADULT (Left) as a surgical intervention .  The patients' history has been reviewed, patient examined, no change in status, stable for surgery.  I have reviewed the patients' chart and labs.  Questions were answered to the patient's satisfaction.     Beda Dula S

## 2011-10-08 NOTE — Anesthesia Preprocedure Evaluation (Signed)
Anesthesia Evaluation  Patient identified by MRN, date of birth, ID band Patient awake    Reviewed: Allergy & Precautions, H&P , NPO status , Patient's Chart, lab work & pertinent test results, reviewed documented beta blocker date and time   Airway Mallampati: II TM Distance: >3 FB Neck ROM: Full    Dental  (+) Teeth Intact and Dental Advisory Given   Pulmonary neg pulmonary ROS,  breath sounds clear to auscultation        Cardiovascular hypertension, Pt. on medications Rhythm:Regular Rate:Normal  Denies cardiac symptoms   Neuro/Psych negative neurological ROS  negative psych ROS   GI/Hepatic negative GI ROS, Neg liver ROS,   Endo/Other  negative endocrine ROS  Renal/GU negative Renal ROS  negative genitourinary   Musculoskeletal negative musculoskeletal ROS (+)   Abdominal   Peds negative pediatric ROS (+)  Hematology negative hematology ROS (+)   Anesthesia Other Findings   Reproductive/Obstetrics hydrocele                           Anesthesia Physical Anesthesia Plan  ASA: II  Anesthesia Plan: General   Post-op Pain Management:    Induction: Intravenous  Airway Management Planned: LMA  Additional Equipment:   Intra-op Plan:   Post-operative Plan: Extubation in OR  Informed Consent: I have reviewed the patients History and Physical, chart, labs and discussed the procedure including the risks, benefits and alternatives for the proposed anesthesia with the patient or authorized representative who has indicated his/her understanding and acceptance.   Dental advisory given  Plan Discussed with: CRNA and Surgeon  Anesthesia Plan Comments:         Anesthesia Quick Evaluation

## 2011-10-08 NOTE — Transfer of Care (Signed)
Immediate Anesthesia Transfer of Care Note  Patient: [name]  Procedure(s) Performed: Procedure(s) (LRB): HYDROCELECTOMY ADULT (Left)  Patient Location: PACU  Anesthesia Type: General  Level of Consciousness: awake, oriented, sedated and patient cooperative  Airway & Oxygen Therapy: Patient Spontanous Breathing and Patient connected to face mask oxygen  Post-op Assessment: Report given to PACU RN and Post -op Vital signs reviewed and stable  Post vital signs: Reviewed and stable  Complications: No apparent anesthesia complications

## 2011-10-08 NOTE — Discharge Instructions (Signed)
Scrotal surgery postoperative instructions  Wound:  In most cases your incision will have absorbable sutures that will dissolve within the first 10-20 days. Some will fall out even earlier. Expect some redness as the sutures dissolved but this should occur only around the sutures. If there is generalized redness, especially with increasing pain or swelling, let us know. The scrotum will very likely get "black and blue" as the blood in the tissues spread. Sometimes the whole scrotum will turn colors. The black and blue is followed by a yellow and brown color. In time, all the discoloration will go away. In some cases some firm swelling in the area of the testicle may persist for up to 4-6 weeks after the surgery and is considered normal in most cases.  Diet:  You may return to your normal diet within 24 hours following your surgery. You may note some mild nausea and possibly vomiting the first 6-8 hours following surgery. This is usually due to the side effects of anesthesia, and will disappear quite soon. I would suggest clear liquids and a very light meal the first evening following your surgery.  Activity:  Your physical activity should be restricted the first 48 hours. During that time you should remain relatively inactive, moving about only when necessary. During the first 7-10 days following surgery he should avoid lifting any heavy objects (anything greater than 15 pounds), and avoid strenuous exercise. If you work, ask [place] specifically about your restrictions, both for work and home. We will write a note to your employer if needed.  You should plan to wear a tight pair of jockey shorts or an athletic supporter for the first 4-5 days, even to sleep. This will keep the scrotum immobilized to some degree and keep the swelling down.  Ice packs should be placed on and off over the scrotum for the first 48 hours. Frozen peas or corn in a ZipLock bag can be frozen, used and re-frozen. Fifteen minutes  on and 15 minutes off is a reasonable schedule. The ice is a good pain reliever and keeps the swelling down.  Hygiene:  You may shower 48 hours after your surgery. Tub bathing should be restricted until the seventh day.          Medication:  You will be sent home with some type of pain medication. In many cases you will be sent home with a narcotic pain pill (Vicodin or Tylox). If the pain is not too bad, you may take either Tylenol (acetaminophen) or Advil (ibuprofen) which contain no narcotic agents, and might be tolerated a little better, with fewer side effects. If the pain medication you are sent home with does not control the pain, you will have to let us know. Some narcotic pain medications cannot be given or refilled by a phone call to a pharmacy.  You may restart aspirin in 1 week.  Problems you should report to [place]:   Fever of 101.0 degrees Fahrenheit or greater.  Moderate or severe swelling under the skin incision or involving the scrotum.  Drug reaction such as hives, a rash, nausea or vomiting.   Post Anesthesia Home Care Instructions  Activity: Get plenty of rest for the remainder of the day. A responsible adult should stay with you for 24 hours following the procedure.  For the next 24 hours, DO NOT: -Drive a car -Advertising copywriter -Drink alcoholic beverages -Take any medication unless instructed by your physician -Make any legal decisions or sign important papers.  Meals:  Start with liquid foods such as gelatin or soup. Progress to regular foods as tolerated. Avoid greasy, spicy, heavy foods. If nausea and/or vomiting occur, drink only clear liquids until the nausea and/or vomiting subsides. Call your physician if vomiting continues.  Special Instructions/Symptoms: Your throat may feel dry or sore from the anesthesia or the breathing tube placed in your throat during surgery. If this causes discomfort, gargle with warm salt water. The discomfort should  disappear within 24 hours.

## 2011-10-08 NOTE — Op Note (Signed)
Preoperative diagnosis: Left testicular hydrocele  Postoperative diagnosis: Same  Procedure: Hydrocele repair  Surgeon: Valetta Fuller M.D.  Anesthesia: Gen.  Indications: Patient presented with scrotal swelling. A hydrocele was confirmed with scrotal ultrasonography. The patient was symptomatic from his hydrocele and requested surgical intervention. He appeared to understand the risks benefits potential complications of this procedure. The patient has received perioperative antibiotics and placement of PAS compression boots.  Technique and findings: Patient was brought to the operating room where he had successful induction of general anesthesia. The scrotum was then prepped and draped in the usual manner. Appropriate surgical timeout was performed. An incision was made in the median raphae of the scrotum. A tense hydrocele was encountered which was freed from the scrotal wall and then the testis and hydrocele sac were delivered from the incision. The hydrocele sac was then incised and a moderate amount of clear yellow fluid was obtained. The testis was carefully inspected and no other pathology was appreciated. The hydrocele sac was relatively thin in thickness. Some of the redundant sac was excised with electrocautery. The sac was then plicated with some 4-0 Vicryl suture. The spermatic cord block was then performed with quarter percent Marcaine. The testis was returned to the hemiscrotum taking great care to make sure that there was no twisting of the spermatic cord. The scrotal compartment was then copiously irrigated. The scrotal incision was then closed with multiple layers of Vicryl suture. A Tegaderm dressing was applied. Sponge and needle counts were correct. No obvious complications occurred and the patient was brought to recovery room in stable condition.

## 2011-10-08 NOTE — Anesthesia Postprocedure Evaluation (Signed)
  Anesthesia Post-op Note  Patient: [name]  Procedure(s) Performed: Procedure(s) (LRB): HYDROCELECTOMY ADULT (Left)  Patient Location: PACU  Anesthesia Type: General  Level of Consciousness: oriented and sedated  Airway and Oxygen Therapy: Patient Spontanous Breathing  Post-op Pain: mild  Post-op Assessment: Post-op Vital signs reviewed, Patient's Cardiovascular Status Stable, Respiratory Function Stable and Patent Airway  Post-op Vital Signs: stable  Complications: No apparent anesthesia complications

## 2011-10-09 ENCOUNTER — Encounter (HOSPITAL_BASED_OUTPATIENT_CLINIC_OR_DEPARTMENT_OTHER): Payer: Self-pay | Admitting: Urology

## 2011-10-18 DIAGNOSIS — M25559 Pain in unspecified hip: Secondary | ICD-10-CM | POA: Diagnosis not present

## 2011-10-18 DIAGNOSIS — M545 Low back pain: Secondary | ICD-10-CM | POA: Diagnosis not present

## 2011-10-19 DIAGNOSIS — N433 Hydrocele, unspecified: Secondary | ICD-10-CM | POA: Diagnosis not present

## 2011-10-24 DIAGNOSIS — M25559 Pain in unspecified hip: Secondary | ICD-10-CM | POA: Diagnosis not present

## 2011-10-24 DIAGNOSIS — M545 Low back pain: Secondary | ICD-10-CM | POA: Diagnosis not present

## 2011-10-31 DIAGNOSIS — M545 Low back pain: Secondary | ICD-10-CM | POA: Diagnosis not present

## 2011-10-31 DIAGNOSIS — M25559 Pain in unspecified hip: Secondary | ICD-10-CM | POA: Diagnosis not present

## 2011-11-07 DIAGNOSIS — D485 Neoplasm of uncertain behavior of skin: Secondary | ICD-10-CM | POA: Diagnosis not present

## 2011-11-07 DIAGNOSIS — Z85828 Personal history of other malignant neoplasm of skin: Secondary | ICD-10-CM | POA: Diagnosis not present

## 2011-11-07 DIAGNOSIS — L570 Actinic keratosis: Secondary | ICD-10-CM | POA: Diagnosis not present

## 2011-11-07 DIAGNOSIS — L259 Unspecified contact dermatitis, unspecified cause: Secondary | ICD-10-CM | POA: Diagnosis not present

## 2011-11-08 DIAGNOSIS — M25559 Pain in unspecified hip: Secondary | ICD-10-CM | POA: Diagnosis not present

## 2011-11-08 DIAGNOSIS — M545 Low back pain: Secondary | ICD-10-CM | POA: Diagnosis not present

## 2011-11-12 DIAGNOSIS — H1045 Other chronic allergic conjunctivitis: Secondary | ICD-10-CM | POA: Diagnosis not present

## 2011-11-12 DIAGNOSIS — H2510 Age-related nuclear cataract, unspecified eye: Secondary | ICD-10-CM | POA: Diagnosis not present

## 2011-11-12 DIAGNOSIS — H01009 Unspecified blepharitis unspecified eye, unspecified eyelid: Secondary | ICD-10-CM | POA: Diagnosis not present

## 2011-11-14 DIAGNOSIS — M545 Low back pain: Secondary | ICD-10-CM | POA: Diagnosis not present

## 2011-11-14 DIAGNOSIS — M25559 Pain in unspecified hip: Secondary | ICD-10-CM | POA: Diagnosis not present

## 2011-11-21 DIAGNOSIS — M545 Low back pain: Secondary | ICD-10-CM | POA: Diagnosis not present

## 2011-11-21 DIAGNOSIS — M25559 Pain in unspecified hip: Secondary | ICD-10-CM | POA: Diagnosis not present

## 2011-11-22 DIAGNOSIS — M545 Low back pain: Secondary | ICD-10-CM | POA: Diagnosis not present

## 2011-11-22 DIAGNOSIS — M25559 Pain in unspecified hip: Secondary | ICD-10-CM | POA: Diagnosis not present

## 2011-11-28 DIAGNOSIS — M545 Low back pain: Secondary | ICD-10-CM | POA: Diagnosis not present

## 2011-11-28 DIAGNOSIS — M25559 Pain in unspecified hip: Secondary | ICD-10-CM | POA: Diagnosis not present

## 2011-12-05 DIAGNOSIS — L259 Unspecified contact dermatitis, unspecified cause: Secondary | ICD-10-CM | POA: Diagnosis not present

## 2011-12-05 DIAGNOSIS — C4442 Squamous cell carcinoma of skin of scalp and neck: Secondary | ICD-10-CM | POA: Diagnosis not present

## 2011-12-05 DIAGNOSIS — D044 Carcinoma in situ of skin of scalp and neck: Secondary | ICD-10-CM | POA: Diagnosis not present

## 2011-12-19 DIAGNOSIS — M545 Low back pain: Secondary | ICD-10-CM | POA: Diagnosis not present

## 2011-12-19 DIAGNOSIS — M25559 Pain in unspecified hip: Secondary | ICD-10-CM | POA: Diagnosis not present

## 2011-12-26 DIAGNOSIS — M25559 Pain in unspecified hip: Secondary | ICD-10-CM | POA: Diagnosis not present

## 2011-12-26 DIAGNOSIS — M545 Low back pain: Secondary | ICD-10-CM | POA: Diagnosis not present

## 2012-01-04 DIAGNOSIS — M5430 Sciatica, unspecified side: Secondary | ICD-10-CM | POA: Diagnosis not present

## 2012-01-04 DIAGNOSIS — M5137 Other intervertebral disc degeneration, lumbosacral region: Secondary | ICD-10-CM | POA: Diagnosis not present

## 2012-01-04 DIAGNOSIS — M999 Biomechanical lesion, unspecified: Secondary | ICD-10-CM | POA: Diagnosis not present

## 2012-01-07 DIAGNOSIS — M5137 Other intervertebral disc degeneration, lumbosacral region: Secondary | ICD-10-CM | POA: Diagnosis not present

## 2012-01-07 DIAGNOSIS — M999 Biomechanical lesion, unspecified: Secondary | ICD-10-CM | POA: Diagnosis not present

## 2012-01-07 DIAGNOSIS — M5430 Sciatica, unspecified side: Secondary | ICD-10-CM | POA: Diagnosis not present

## 2012-01-08 DIAGNOSIS — M5430 Sciatica, unspecified side: Secondary | ICD-10-CM | POA: Diagnosis not present

## 2012-01-08 DIAGNOSIS — M999 Biomechanical lesion, unspecified: Secondary | ICD-10-CM | POA: Diagnosis not present

## 2012-01-08 DIAGNOSIS — M5137 Other intervertebral disc degeneration, lumbosacral region: Secondary | ICD-10-CM | POA: Diagnosis not present

## 2012-01-22 DIAGNOSIS — M533 Sacrococcygeal disorders, not elsewhere classified: Secondary | ICD-10-CM | POA: Diagnosis not present

## 2012-01-28 DIAGNOSIS — R972 Elevated prostate specific antigen [PSA]: Secondary | ICD-10-CM | POA: Diagnosis not present

## 2012-01-28 DIAGNOSIS — N401 Enlarged prostate with lower urinary tract symptoms: Secondary | ICD-10-CM | POA: Diagnosis not present

## 2012-01-28 DIAGNOSIS — N433 Hydrocele, unspecified: Secondary | ICD-10-CM | POA: Diagnosis not present

## 2012-02-04 DIAGNOSIS — M542 Cervicalgia: Secondary | ICD-10-CM | POA: Diagnosis not present

## 2012-02-19 DIAGNOSIS — M47812 Spondylosis without myelopathy or radiculopathy, cervical region: Secondary | ICD-10-CM | POA: Diagnosis not present

## 2012-02-19 DIAGNOSIS — M722 Plantar fascial fibromatosis: Secondary | ICD-10-CM | POA: Diagnosis not present

## 2012-02-21 DIAGNOSIS — Z125 Encounter for screening for malignant neoplasm of prostate: Secondary | ICD-10-CM | POA: Diagnosis not present

## 2012-02-21 DIAGNOSIS — M899 Disorder of bone, unspecified: Secondary | ICD-10-CM | POA: Diagnosis not present

## 2012-02-21 DIAGNOSIS — I10 Essential (primary) hypertension: Secondary | ICD-10-CM | POA: Diagnosis not present

## 2012-02-21 DIAGNOSIS — K219 Gastro-esophageal reflux disease without esophagitis: Secondary | ICD-10-CM | POA: Diagnosis not present

## 2012-02-21 DIAGNOSIS — E039 Hypothyroidism, unspecified: Secondary | ICD-10-CM | POA: Diagnosis not present

## 2012-02-21 DIAGNOSIS — M949 Disorder of cartilage, unspecified: Secondary | ICD-10-CM | POA: Diagnosis not present

## 2012-02-21 DIAGNOSIS — Z0000 Encounter for general adult medical examination without abnormal findings: Secondary | ICD-10-CM | POA: Diagnosis not present

## 2012-02-27 DIAGNOSIS — I10 Essential (primary) hypertension: Secondary | ICD-10-CM | POA: Diagnosis not present

## 2012-02-27 DIAGNOSIS — J45909 Unspecified asthma, uncomplicated: Secondary | ICD-10-CM | POA: Diagnosis not present

## 2012-02-27 DIAGNOSIS — Z23 Encounter for immunization: Secondary | ICD-10-CM | POA: Diagnosis not present

## 2012-02-27 DIAGNOSIS — E0590 Thyrotoxicosis, unspecified without thyrotoxic crisis or storm: Secondary | ICD-10-CM | POA: Diagnosis not present

## 2012-04-08 ENCOUNTER — Other Ambulatory Visit: Payer: Self-pay | Admitting: Internal Medicine

## 2012-04-16 DIAGNOSIS — D1801 Hemangioma of skin and subcutaneous tissue: Secondary | ICD-10-CM | POA: Diagnosis not present

## 2012-04-16 DIAGNOSIS — L259 Unspecified contact dermatitis, unspecified cause: Secondary | ICD-10-CM | POA: Diagnosis not present

## 2012-04-16 DIAGNOSIS — D485 Neoplasm of uncertain behavior of skin: Secondary | ICD-10-CM | POA: Diagnosis not present

## 2012-04-16 DIAGNOSIS — L570 Actinic keratosis: Secondary | ICD-10-CM | POA: Diagnosis not present

## 2012-05-01 DIAGNOSIS — C44320 Squamous cell carcinoma of skin of unspecified parts of face: Secondary | ICD-10-CM | POA: Diagnosis not present

## 2012-05-01 DIAGNOSIS — L259 Unspecified contact dermatitis, unspecified cause: Secondary | ICD-10-CM | POA: Diagnosis not present

## 2012-05-15 DIAGNOSIS — D485 Neoplasm of uncertain behavior of skin: Secondary | ICD-10-CM | POA: Diagnosis not present

## 2012-05-15 DIAGNOSIS — L259 Unspecified contact dermatitis, unspecified cause: Secondary | ICD-10-CM | POA: Diagnosis not present

## 2012-06-12 DIAGNOSIS — S139XXA Sprain of joints and ligaments of unspecified parts of neck, initial encounter: Secondary | ICD-10-CM | POA: Diagnosis not present

## 2012-06-17 DIAGNOSIS — S139XXA Sprain of joints and ligaments of unspecified parts of neck, initial encounter: Secondary | ICD-10-CM | POA: Diagnosis not present

## 2012-06-25 DIAGNOSIS — S139XXA Sprain of joints and ligaments of unspecified parts of neck, initial encounter: Secondary | ICD-10-CM | POA: Diagnosis not present

## 2012-06-30 DIAGNOSIS — S139XXA Sprain of joints and ligaments of unspecified parts of neck, initial encounter: Secondary | ICD-10-CM | POA: Diagnosis not present

## 2012-07-07 DIAGNOSIS — S139XXA Sprain of joints and ligaments of unspecified parts of neck, initial encounter: Secondary | ICD-10-CM | POA: Diagnosis not present

## 2012-07-15 DIAGNOSIS — S139XXA Sprain of joints and ligaments of unspecified parts of neck, initial encounter: Secondary | ICD-10-CM | POA: Diagnosis not present

## 2012-08-13 DIAGNOSIS — S139XXA Sprain of joints and ligaments of unspecified parts of neck, initial encounter: Secondary | ICD-10-CM | POA: Diagnosis not present

## 2012-08-13 DIAGNOSIS — D485 Neoplasm of uncertain behavior of skin: Secondary | ICD-10-CM | POA: Diagnosis not present

## 2012-08-13 DIAGNOSIS — L570 Actinic keratosis: Secondary | ICD-10-CM | POA: Diagnosis not present

## 2012-09-15 DIAGNOSIS — S139XXA Sprain of joints and ligaments of unspecified parts of neck, initial encounter: Secondary | ICD-10-CM | POA: Diagnosis not present

## 2012-09-16 DIAGNOSIS — L820 Inflamed seborrheic keratosis: Secondary | ICD-10-CM | POA: Diagnosis not present

## 2012-09-16 DIAGNOSIS — Z85828 Personal history of other malignant neoplasm of skin: Secondary | ICD-10-CM | POA: Insufficient documentation

## 2012-10-22 DIAGNOSIS — L570 Actinic keratosis: Secondary | ICD-10-CM | POA: Diagnosis not present

## 2012-10-22 DIAGNOSIS — D044 Carcinoma in situ of skin of scalp and neck: Secondary | ICD-10-CM | POA: Diagnosis not present

## 2012-10-27 DIAGNOSIS — S139XXA Sprain of joints and ligaments of unspecified parts of neck, initial encounter: Secondary | ICD-10-CM | POA: Diagnosis not present

## 2012-12-01 ENCOUNTER — Other Ambulatory Visit (HOSPITAL_COMMUNITY): Payer: Self-pay | Admitting: Internal Medicine

## 2012-12-01 DIAGNOSIS — R05 Cough: Secondary | ICD-10-CM | POA: Diagnosis not present

## 2012-12-01 DIAGNOSIS — R0789 Other chest pain: Secondary | ICD-10-CM | POA: Diagnosis not present

## 2012-12-01 DIAGNOSIS — R079 Chest pain, unspecified: Secondary | ICD-10-CM

## 2012-12-04 DIAGNOSIS — S139XXA Sprain of joints and ligaments of unspecified parts of neck, initial encounter: Secondary | ICD-10-CM | POA: Diagnosis not present

## 2012-12-08 DIAGNOSIS — S139XXA Sprain of joints and ligaments of unspecified parts of neck, initial encounter: Secondary | ICD-10-CM | POA: Diagnosis not present

## 2012-12-10 ENCOUNTER — Other Ambulatory Visit (HOSPITAL_COMMUNITY): Payer: Self-pay | Admitting: Internal Medicine

## 2012-12-10 ENCOUNTER — Ambulatory Visit (HOSPITAL_COMMUNITY)
Admission: RE | Admit: 2012-12-10 | Discharge: 2012-12-10 | Disposition: A | Discharge status: Home / Self Care | Payer: Medicare Other | Source: Ambulatory Visit | Attending: Internal Medicine | Admitting: Internal Medicine

## 2012-12-10 DIAGNOSIS — R079 Chest pain, unspecified: Secondary | ICD-10-CM | POA: Insufficient documentation

## 2012-12-18 DIAGNOSIS — L570 Actinic keratosis: Secondary | ICD-10-CM | POA: Diagnosis not present

## 2012-12-18 DIAGNOSIS — Z85828 Personal history of other malignant neoplasm of skin: Secondary | ICD-10-CM | POA: Diagnosis not present

## 2012-12-22 DIAGNOSIS — S139XXA Sprain of joints and ligaments of unspecified parts of neck, initial encounter: Secondary | ICD-10-CM | POA: Diagnosis not present

## 2013-01-19 DIAGNOSIS — N401 Enlarged prostate with lower urinary tract symptoms: Secondary | ICD-10-CM | POA: Diagnosis not present

## 2013-01-19 DIAGNOSIS — N433 Hydrocele, unspecified: Secondary | ICD-10-CM | POA: Diagnosis not present

## 2013-01-19 DIAGNOSIS — R972 Elevated prostate specific antigen [PSA]: Secondary | ICD-10-CM | POA: Diagnosis not present

## 2013-01-27 DIAGNOSIS — S139XXA Sprain of joints and ligaments of unspecified parts of neck, initial encounter: Secondary | ICD-10-CM | POA: Diagnosis not present

## 2013-02-11 DIAGNOSIS — L570 Actinic keratosis: Secondary | ICD-10-CM | POA: Diagnosis not present

## 2013-03-02 DIAGNOSIS — I10 Essential (primary) hypertension: Secondary | ICD-10-CM | POA: Diagnosis not present

## 2013-03-02 DIAGNOSIS — Z125 Encounter for screening for malignant neoplasm of prostate: Secondary | ICD-10-CM | POA: Diagnosis not present

## 2013-03-02 DIAGNOSIS — Z0000 Encounter for general adult medical examination without abnormal findings: Secondary | ICD-10-CM | POA: Diagnosis not present

## 2013-03-02 DIAGNOSIS — E039 Hypothyroidism, unspecified: Secondary | ICD-10-CM | POA: Diagnosis not present

## 2013-03-02 DIAGNOSIS — K219 Gastro-esophageal reflux disease without esophagitis: Secondary | ICD-10-CM | POA: Diagnosis not present

## 2013-03-02 DIAGNOSIS — Z79899 Other long term (current) drug therapy: Secondary | ICD-10-CM | POA: Diagnosis not present

## 2013-03-02 DIAGNOSIS — M899 Disorder of bone, unspecified: Secondary | ICD-10-CM | POA: Diagnosis not present

## 2013-03-09 DIAGNOSIS — R55 Syncope and collapse: Secondary | ICD-10-CM | POA: Diagnosis not present

## 2013-03-09 DIAGNOSIS — M542 Cervicalgia: Secondary | ICD-10-CM | POA: Diagnosis not present

## 2013-03-09 DIAGNOSIS — J45909 Unspecified asthma, uncomplicated: Secondary | ICD-10-CM | POA: Diagnosis not present

## 2013-03-09 DIAGNOSIS — I10 Essential (primary) hypertension: Secondary | ICD-10-CM | POA: Diagnosis not present

## 2013-03-09 DIAGNOSIS — M899 Disorder of bone, unspecified: Secondary | ICD-10-CM | POA: Diagnosis not present

## 2013-03-09 DIAGNOSIS — Z23 Encounter for immunization: Secondary | ICD-10-CM | POA: Diagnosis not present

## 2013-03-09 DIAGNOSIS — N400 Enlarged prostate without lower urinary tract symptoms: Secondary | ICD-10-CM | POA: Diagnosis not present

## 2013-03-09 DIAGNOSIS — Z006 Encounter for examination for normal comparison and control in clinical research program: Secondary | ICD-10-CM | POA: Diagnosis not present

## 2013-03-13 DIAGNOSIS — L570 Actinic keratosis: Secondary | ICD-10-CM | POA: Diagnosis not present

## 2013-03-26 DIAGNOSIS — H0019 Chalazion unspecified eye, unspecified eyelid: Secondary | ICD-10-CM | POA: Diagnosis not present

## 2013-03-26 DIAGNOSIS — S139XXA Sprain of joints and ligaments of unspecified parts of neck, initial encounter: Secondary | ICD-10-CM | POA: Diagnosis not present

## 2013-04-21 DIAGNOSIS — L570 Actinic keratosis: Secondary | ICD-10-CM | POA: Diagnosis not present

## 2013-04-24 DIAGNOSIS — M25519 Pain in unspecified shoulder: Secondary | ICD-10-CM | POA: Diagnosis not present

## 2013-04-24 DIAGNOSIS — S139XXA Sprain of joints and ligaments of unspecified parts of neck, initial encounter: Secondary | ICD-10-CM | POA: Diagnosis not present

## 2013-04-24 DIAGNOSIS — M545 Low back pain: Secondary | ICD-10-CM | POA: Diagnosis not present

## 2013-05-01 DIAGNOSIS — M7700 Medial epicondylitis, unspecified elbow: Secondary | ICD-10-CM | POA: Diagnosis not present

## 2013-05-12 DIAGNOSIS — M545 Low back pain: Secondary | ICD-10-CM | POA: Diagnosis not present

## 2013-05-19 DIAGNOSIS — L570 Actinic keratosis: Secondary | ICD-10-CM | POA: Diagnosis not present

## 2013-05-21 DIAGNOSIS — M533 Sacrococcygeal disorders, not elsewhere classified: Secondary | ICD-10-CM | POA: Diagnosis not present

## 2013-06-08 DIAGNOSIS — M533 Sacrococcygeal disorders, not elsewhere classified: Secondary | ICD-10-CM | POA: Diagnosis not present

## 2013-06-15 DIAGNOSIS — M545 Low back pain, unspecified: Secondary | ICD-10-CM | POA: Diagnosis not present

## 2013-06-15 DIAGNOSIS — S139XXA Sprain of joints and ligaments of unspecified parts of neck, initial encounter: Secondary | ICD-10-CM | POA: Diagnosis not present

## 2013-06-15 DIAGNOSIS — M25519 Pain in unspecified shoulder: Secondary | ICD-10-CM | POA: Diagnosis not present

## 2013-07-01 DIAGNOSIS — IMO0001 Reserved for inherently not codable concepts without codable children: Secondary | ICD-10-CM | POA: Diagnosis not present

## 2013-07-01 DIAGNOSIS — M47817 Spondylosis without myelopathy or radiculopathy, lumbosacral region: Secondary | ICD-10-CM | POA: Diagnosis not present

## 2013-07-01 DIAGNOSIS — M999 Biomechanical lesion, unspecified: Secondary | ICD-10-CM | POA: Diagnosis not present

## 2013-07-22 DIAGNOSIS — M7700 Medial epicondylitis, unspecified elbow: Secondary | ICD-10-CM | POA: Diagnosis not present

## 2013-07-27 DIAGNOSIS — M47817 Spondylosis without myelopathy or radiculopathy, lumbosacral region: Secondary | ICD-10-CM | POA: Diagnosis not present

## 2013-07-27 DIAGNOSIS — IMO0001 Reserved for inherently not codable concepts without codable children: Secondary | ICD-10-CM | POA: Diagnosis not present

## 2013-07-27 DIAGNOSIS — M999 Biomechanical lesion, unspecified: Secondary | ICD-10-CM | POA: Diagnosis not present

## 2013-08-04 DIAGNOSIS — M25519 Pain in unspecified shoulder: Secondary | ICD-10-CM | POA: Diagnosis not present

## 2013-08-04 DIAGNOSIS — M545 Low back pain, unspecified: Secondary | ICD-10-CM | POA: Diagnosis not present

## 2013-08-04 DIAGNOSIS — S139XXA Sprain of joints and ligaments of unspecified parts of neck, initial encounter: Secondary | ICD-10-CM | POA: Diagnosis not present

## 2013-09-16 DIAGNOSIS — L570 Actinic keratosis: Secondary | ICD-10-CM | POA: Diagnosis not present

## 2013-09-16 DIAGNOSIS — Z85828 Personal history of other malignant neoplasm of skin: Secondary | ICD-10-CM | POA: Diagnosis not present

## 2013-09-16 DIAGNOSIS — D485 Neoplasm of uncertain behavior of skin: Secondary | ICD-10-CM | POA: Diagnosis not present

## 2013-09-16 DIAGNOSIS — C4441 Basal cell carcinoma of skin of scalp and neck: Secondary | ICD-10-CM | POA: Diagnosis not present

## 2013-09-18 DIAGNOSIS — M545 Low back pain, unspecified: Secondary | ICD-10-CM | POA: Diagnosis not present

## 2013-09-18 DIAGNOSIS — M25519 Pain in unspecified shoulder: Secondary | ICD-10-CM | POA: Diagnosis not present

## 2013-10-08 DIAGNOSIS — C4442 Squamous cell carcinoma of skin of scalp and neck: Secondary | ICD-10-CM | POA: Diagnosis not present

## 2013-10-22 DIAGNOSIS — C4442 Squamous cell carcinoma of skin of scalp and neck: Secondary | ICD-10-CM | POA: Diagnosis not present

## 2013-10-22 DIAGNOSIS — L821 Other seborrheic keratosis: Secondary | ICD-10-CM | POA: Diagnosis not present

## 2013-10-22 DIAGNOSIS — D485 Neoplasm of uncertain behavior of skin: Secondary | ICD-10-CM | POA: Diagnosis not present

## 2013-10-27 DIAGNOSIS — M47817 Spondylosis without myelopathy or radiculopathy, lumbosacral region: Secondary | ICD-10-CM | POA: Diagnosis not present

## 2013-10-27 DIAGNOSIS — M999 Biomechanical lesion, unspecified: Secondary | ICD-10-CM | POA: Diagnosis not present

## 2013-10-27 DIAGNOSIS — IMO0001 Reserved for inherently not codable concepts without codable children: Secondary | ICD-10-CM | POA: Diagnosis not present

## 2013-10-29 DIAGNOSIS — S139XXA Sprain of joints and ligaments of unspecified parts of neck, initial encounter: Secondary | ICD-10-CM | POA: Diagnosis not present

## 2013-10-29 DIAGNOSIS — M545 Low back pain, unspecified: Secondary | ICD-10-CM | POA: Diagnosis not present

## 2013-10-29 DIAGNOSIS — M25519 Pain in unspecified shoulder: Secondary | ICD-10-CM | POA: Diagnosis not present

## 2013-11-06 DIAGNOSIS — M47817 Spondylosis without myelopathy or radiculopathy, lumbosacral region: Secondary | ICD-10-CM | POA: Diagnosis not present

## 2013-11-06 DIAGNOSIS — M999 Biomechanical lesion, unspecified: Secondary | ICD-10-CM | POA: Diagnosis not present

## 2013-11-06 DIAGNOSIS — IMO0001 Reserved for inherently not codable concepts without codable children: Secondary | ICD-10-CM | POA: Diagnosis not present

## 2013-11-11 DIAGNOSIS — C4442 Squamous cell carcinoma of skin of scalp and neck: Secondary | ICD-10-CM | POA: Diagnosis not present

## 2013-11-16 DIAGNOSIS — M545 Low back pain, unspecified: Secondary | ICD-10-CM | POA: Diagnosis not present

## 2013-11-16 DIAGNOSIS — S139XXA Sprain of joints and ligaments of unspecified parts of neck, initial encounter: Secondary | ICD-10-CM | POA: Diagnosis not present

## 2013-11-16 DIAGNOSIS — M25519 Pain in unspecified shoulder: Secondary | ICD-10-CM | POA: Diagnosis not present

## 2013-11-18 DIAGNOSIS — M545 Low back pain, unspecified: Secondary | ICD-10-CM | POA: Diagnosis not present

## 2013-11-18 DIAGNOSIS — S139XXA Sprain of joints and ligaments of unspecified parts of neck, initial encounter: Secondary | ICD-10-CM | POA: Diagnosis not present

## 2013-11-18 DIAGNOSIS — M25519 Pain in unspecified shoulder: Secondary | ICD-10-CM | POA: Diagnosis not present

## 2013-11-24 DIAGNOSIS — M25519 Pain in unspecified shoulder: Secondary | ICD-10-CM | POA: Diagnosis not present

## 2013-11-24 DIAGNOSIS — M545 Low back pain, unspecified: Secondary | ICD-10-CM | POA: Diagnosis not present

## 2013-11-24 DIAGNOSIS — S139XXA Sprain of joints and ligaments of unspecified parts of neck, initial encounter: Secondary | ICD-10-CM | POA: Diagnosis not present

## 2013-12-02 DIAGNOSIS — S139XXA Sprain of joints and ligaments of unspecified parts of neck, initial encounter: Secondary | ICD-10-CM | POA: Diagnosis not present

## 2013-12-02 DIAGNOSIS — M545 Low back pain, unspecified: Secondary | ICD-10-CM | POA: Diagnosis not present

## 2013-12-02 DIAGNOSIS — M25519 Pain in unspecified shoulder: Secondary | ICD-10-CM | POA: Diagnosis not present

## 2013-12-03 DIAGNOSIS — S8000XA Contusion of unspecified knee, initial encounter: Secondary | ICD-10-CM | POA: Diagnosis not present

## 2013-12-09 DIAGNOSIS — S139XXA Sprain of joints and ligaments of unspecified parts of neck, initial encounter: Secondary | ICD-10-CM | POA: Diagnosis not present

## 2013-12-09 DIAGNOSIS — M25519 Pain in unspecified shoulder: Secondary | ICD-10-CM | POA: Diagnosis not present

## 2013-12-09 DIAGNOSIS — M545 Low back pain, unspecified: Secondary | ICD-10-CM | POA: Diagnosis not present

## 2013-12-16 DIAGNOSIS — M545 Low back pain, unspecified: Secondary | ICD-10-CM | POA: Diagnosis not present

## 2013-12-16 DIAGNOSIS — M25519 Pain in unspecified shoulder: Secondary | ICD-10-CM | POA: Diagnosis not present

## 2013-12-16 DIAGNOSIS — S139XXA Sprain of joints and ligaments of unspecified parts of neck, initial encounter: Secondary | ICD-10-CM | POA: Diagnosis not present

## 2013-12-25 DIAGNOSIS — M999 Biomechanical lesion, unspecified: Secondary | ICD-10-CM | POA: Diagnosis not present

## 2013-12-25 DIAGNOSIS — IMO0001 Reserved for inherently not codable concepts without codable children: Secondary | ICD-10-CM | POA: Diagnosis not present

## 2013-12-25 DIAGNOSIS — M47817 Spondylosis without myelopathy or radiculopathy, lumbosacral region: Secondary | ICD-10-CM | POA: Diagnosis not present

## 2013-12-30 DIAGNOSIS — IMO0001 Reserved for inherently not codable concepts without codable children: Secondary | ICD-10-CM | POA: Diagnosis not present

## 2013-12-30 DIAGNOSIS — M999 Biomechanical lesion, unspecified: Secondary | ICD-10-CM | POA: Diagnosis not present

## 2013-12-30 DIAGNOSIS — M47817 Spondylosis without myelopathy or radiculopathy, lumbosacral region: Secondary | ICD-10-CM | POA: Diagnosis not present

## 2014-01-11 DIAGNOSIS — M47817 Spondylosis without myelopathy or radiculopathy, lumbosacral region: Secondary | ICD-10-CM | POA: Diagnosis not present

## 2014-01-11 DIAGNOSIS — IMO0001 Reserved for inherently not codable concepts without codable children: Secondary | ICD-10-CM | POA: Diagnosis not present

## 2014-01-11 DIAGNOSIS — M999 Biomechanical lesion, unspecified: Secondary | ICD-10-CM | POA: Diagnosis not present

## 2014-01-13 DIAGNOSIS — S139XXA Sprain of joints and ligaments of unspecified parts of neck, initial encounter: Secondary | ICD-10-CM | POA: Diagnosis not present

## 2014-01-13 DIAGNOSIS — M545 Low back pain, unspecified: Secondary | ICD-10-CM | POA: Diagnosis not present

## 2014-01-13 DIAGNOSIS — M25519 Pain in unspecified shoulder: Secondary | ICD-10-CM | POA: Diagnosis not present

## 2014-01-18 DIAGNOSIS — M25519 Pain in unspecified shoulder: Secondary | ICD-10-CM | POA: Diagnosis not present

## 2014-01-18 DIAGNOSIS — M545 Low back pain, unspecified: Secondary | ICD-10-CM | POA: Diagnosis not present

## 2014-01-18 DIAGNOSIS — S139XXA Sprain of joints and ligaments of unspecified parts of neck, initial encounter: Secondary | ICD-10-CM | POA: Diagnosis not present

## 2014-01-20 DIAGNOSIS — IMO0001 Reserved for inherently not codable concepts without codable children: Secondary | ICD-10-CM | POA: Diagnosis not present

## 2014-01-20 DIAGNOSIS — M999 Biomechanical lesion, unspecified: Secondary | ICD-10-CM | POA: Diagnosis not present

## 2014-01-20 DIAGNOSIS — M47817 Spondylosis without myelopathy or radiculopathy, lumbosacral region: Secondary | ICD-10-CM | POA: Diagnosis not present

## 2014-01-25 DIAGNOSIS — N139 Obstructive and reflux uropathy, unspecified: Secondary | ICD-10-CM | POA: Diagnosis not present

## 2014-01-25 DIAGNOSIS — N401 Enlarged prostate with lower urinary tract symptoms: Secondary | ICD-10-CM | POA: Diagnosis not present

## 2014-01-25 DIAGNOSIS — N3941 Urge incontinence: Secondary | ICD-10-CM | POA: Diagnosis not present

## 2014-01-25 DIAGNOSIS — R972 Elevated prostate specific antigen [PSA]: Secondary | ICD-10-CM | POA: Diagnosis not present

## 2014-01-28 DIAGNOSIS — H01029 Squamous blepharitis unspecified eye, unspecified eyelid: Secondary | ICD-10-CM | POA: Diagnosis not present

## 2014-01-28 DIAGNOSIS — H11159 Pinguecula, unspecified eye: Secondary | ICD-10-CM | POA: Diagnosis not present

## 2014-01-28 DIAGNOSIS — H2510 Age-related nuclear cataract, unspecified eye: Secondary | ICD-10-CM | POA: Diagnosis not present

## 2014-01-28 DIAGNOSIS — H1045 Other chronic allergic conjunctivitis: Secondary | ICD-10-CM | POA: Diagnosis not present

## 2014-02-12 DIAGNOSIS — IMO0001 Reserved for inherently not codable concepts without codable children: Secondary | ICD-10-CM | POA: Diagnosis not present

## 2014-02-12 DIAGNOSIS — M999 Biomechanical lesion, unspecified: Secondary | ICD-10-CM | POA: Diagnosis not present

## 2014-02-12 DIAGNOSIS — M47817 Spondylosis without myelopathy or radiculopathy, lumbosacral region: Secondary | ICD-10-CM | POA: Diagnosis not present

## 2014-03-08 DIAGNOSIS — L570 Actinic keratosis: Secondary | ICD-10-CM | POA: Diagnosis not present

## 2014-03-09 DIAGNOSIS — M545 Low back pain, unspecified: Secondary | ICD-10-CM | POA: Diagnosis not present

## 2014-03-09 DIAGNOSIS — S139XXA Sprain of joints and ligaments of unspecified parts of neck, initial encounter: Secondary | ICD-10-CM | POA: Diagnosis not present

## 2014-03-09 DIAGNOSIS — M25519 Pain in unspecified shoulder: Secondary | ICD-10-CM | POA: Diagnosis not present

## 2014-03-11 DIAGNOSIS — S138XXD Sprain of joints and ligaments of other parts of neck, subsequent encounter: Secondary | ICD-10-CM | POA: Diagnosis not present

## 2014-03-11 DIAGNOSIS — M545 Low back pain: Secondary | ICD-10-CM | POA: Diagnosis not present

## 2014-03-11 DIAGNOSIS — M25512 Pain in left shoulder: Secondary | ICD-10-CM | POA: Diagnosis not present

## 2014-03-15 DIAGNOSIS — Z0000 Encounter for general adult medical examination without abnormal findings: Secondary | ICD-10-CM | POA: Diagnosis not present

## 2014-03-15 DIAGNOSIS — Z125 Encounter for screening for malignant neoplasm of prostate: Secondary | ICD-10-CM | POA: Diagnosis not present

## 2014-03-15 DIAGNOSIS — E038 Other specified hypothyroidism: Secondary | ICD-10-CM | POA: Diagnosis not present

## 2014-03-15 DIAGNOSIS — Z23 Encounter for immunization: Secondary | ICD-10-CM | POA: Diagnosis not present

## 2014-03-15 DIAGNOSIS — I10 Essential (primary) hypertension: Secondary | ICD-10-CM | POA: Diagnosis not present

## 2014-03-15 DIAGNOSIS — M899 Disorder of bone, unspecified: Secondary | ICD-10-CM | POA: Diagnosis not present

## 2014-03-17 DIAGNOSIS — S138XXD Sprain of joints and ligaments of other parts of neck, subsequent encounter: Secondary | ICD-10-CM | POA: Diagnosis not present

## 2014-03-17 DIAGNOSIS — M545 Low back pain: Secondary | ICD-10-CM | POA: Diagnosis not present

## 2014-03-17 DIAGNOSIS — M25512 Pain in left shoulder: Secondary | ICD-10-CM | POA: Diagnosis not present

## 2014-03-22 DIAGNOSIS — M25512 Pain in left shoulder: Secondary | ICD-10-CM | POA: Diagnosis not present

## 2014-03-22 DIAGNOSIS — I10 Essential (primary) hypertension: Secondary | ICD-10-CM | POA: Diagnosis not present

## 2014-03-22 DIAGNOSIS — S138XXD Sprain of joints and ligaments of other parts of neck, subsequent encounter: Secondary | ICD-10-CM | POA: Diagnosis not present

## 2014-03-22 DIAGNOSIS — R42 Dizziness and giddiness: Secondary | ICD-10-CM | POA: Diagnosis not present

## 2014-03-22 DIAGNOSIS — M8580 Other specified disorders of bone density and structure, unspecified site: Secondary | ICD-10-CM | POA: Diagnosis not present

## 2014-03-22 DIAGNOSIS — M545 Low back pain: Secondary | ICD-10-CM | POA: Diagnosis not present

## 2014-03-22 DIAGNOSIS — H6120 Impacted cerumen, unspecified ear: Secondary | ICD-10-CM | POA: Diagnosis not present

## 2014-03-23 DIAGNOSIS — X32XXXA Exposure to sunlight, initial encounter: Secondary | ICD-10-CM | POA: Diagnosis not present

## 2014-03-23 DIAGNOSIS — L570 Actinic keratosis: Secondary | ICD-10-CM | POA: Diagnosis not present

## 2014-03-25 DIAGNOSIS — S138XXD Sprain of joints and ligaments of other parts of neck, subsequent encounter: Secondary | ICD-10-CM | POA: Diagnosis not present

## 2014-03-25 DIAGNOSIS — M545 Low back pain: Secondary | ICD-10-CM | POA: Diagnosis not present

## 2014-03-25 DIAGNOSIS — M25512 Pain in left shoulder: Secondary | ICD-10-CM | POA: Diagnosis not present

## 2014-03-30 DIAGNOSIS — Z23 Encounter for immunization: Secondary | ICD-10-CM | POA: Diagnosis not present

## 2014-03-31 DIAGNOSIS — M545 Low back pain: Secondary | ICD-10-CM | POA: Diagnosis not present

## 2014-03-31 DIAGNOSIS — M25512 Pain in left shoulder: Secondary | ICD-10-CM | POA: Diagnosis not present

## 2014-03-31 DIAGNOSIS — S138XXD Sprain of joints and ligaments of other parts of neck, subsequent encounter: Secondary | ICD-10-CM | POA: Diagnosis not present

## 2014-04-02 DIAGNOSIS — M9903 Segmental and somatic dysfunction of lumbar region: Secondary | ICD-10-CM | POA: Diagnosis not present

## 2014-04-02 DIAGNOSIS — M47817 Spondylosis without myelopathy or radiculopathy, lumbosacral region: Secondary | ICD-10-CM | POA: Diagnosis not present

## 2014-04-02 DIAGNOSIS — M791 Myalgia: Secondary | ICD-10-CM | POA: Diagnosis not present

## 2014-04-02 DIAGNOSIS — M9902 Segmental and somatic dysfunction of thoracic region: Secondary | ICD-10-CM | POA: Diagnosis not present

## 2014-04-02 DIAGNOSIS — M9905 Segmental and somatic dysfunction of pelvic region: Secondary | ICD-10-CM | POA: Diagnosis not present

## 2014-04-05 DIAGNOSIS — L570 Actinic keratosis: Secondary | ICD-10-CM | POA: Diagnosis not present

## 2014-04-07 DIAGNOSIS — S138XXD Sprain of joints and ligaments of other parts of neck, subsequent encounter: Secondary | ICD-10-CM | POA: Diagnosis not present

## 2014-04-07 DIAGNOSIS — M25512 Pain in left shoulder: Secondary | ICD-10-CM | POA: Diagnosis not present

## 2014-04-07 DIAGNOSIS — M545 Low back pain: Secondary | ICD-10-CM | POA: Diagnosis not present

## 2014-04-28 DIAGNOSIS — M25512 Pain in left shoulder: Secondary | ICD-10-CM | POA: Diagnosis not present

## 2014-04-28 DIAGNOSIS — I951 Orthostatic hypotension: Secondary | ICD-10-CM | POA: Diagnosis not present

## 2014-05-03 DIAGNOSIS — L570 Actinic keratosis: Secondary | ICD-10-CM | POA: Diagnosis not present

## 2014-05-18 DIAGNOSIS — H532 Diplopia: Secondary | ICD-10-CM | POA: Diagnosis not present

## 2014-05-18 DIAGNOSIS — D3132 Benign neoplasm of left choroid: Secondary | ICD-10-CM | POA: Diagnosis not present

## 2014-05-18 DIAGNOSIS — H4922 Sixth [abducent] nerve palsy, left eye: Secondary | ICD-10-CM | POA: Diagnosis not present

## 2014-05-18 DIAGNOSIS — H2513 Age-related nuclear cataract, bilateral: Secondary | ICD-10-CM | POA: Diagnosis not present

## 2014-05-19 DIAGNOSIS — L821 Other seborrheic keratosis: Secondary | ICD-10-CM | POA: Diagnosis not present

## 2014-05-19 DIAGNOSIS — Z85828 Personal history of other malignant neoplasm of skin: Secondary | ICD-10-CM | POA: Diagnosis not present

## 2014-05-19 DIAGNOSIS — X32XXXA Exposure to sunlight, initial encounter: Secondary | ICD-10-CM | POA: Diagnosis not present

## 2014-05-19 DIAGNOSIS — D1801 Hemangioma of skin and subcutaneous tissue: Secondary | ICD-10-CM | POA: Diagnosis not present

## 2014-05-19 DIAGNOSIS — L570 Actinic keratosis: Secondary | ICD-10-CM | POA: Diagnosis not present

## 2014-05-27 ENCOUNTER — Ambulatory Visit: Payer: PRIVATE HEALTH INSURANCE | Admitting: Sports Medicine

## 2014-06-14 DIAGNOSIS — R42 Dizziness and giddiness: Secondary | ICD-10-CM | POA: Diagnosis not present

## 2014-06-14 DIAGNOSIS — I10 Essential (primary) hypertension: Secondary | ICD-10-CM | POA: Diagnosis not present

## 2014-06-14 DIAGNOSIS — M542 Cervicalgia: Secondary | ICD-10-CM | POA: Diagnosis not present

## 2014-06-15 DIAGNOSIS — H4922 Sixth [abducent] nerve palsy, left eye: Secondary | ICD-10-CM | POA: Diagnosis not present

## 2014-06-15 DIAGNOSIS — H2513 Age-related nuclear cataract, bilateral: Secondary | ICD-10-CM | POA: Diagnosis not present

## 2014-06-15 DIAGNOSIS — H532 Diplopia: Secondary | ICD-10-CM | POA: Diagnosis not present

## 2014-06-17 DIAGNOSIS — H532 Diplopia: Secondary | ICD-10-CM | POA: Diagnosis not present

## 2014-06-17 DIAGNOSIS — H4920 Sixth [abducent] nerve palsy, unspecified eye: Secondary | ICD-10-CM | POA: Diagnosis not present

## 2014-07-15 DIAGNOSIS — D3132 Benign neoplasm of left choroid: Secondary | ICD-10-CM | POA: Diagnosis not present

## 2014-07-15 DIAGNOSIS — H532 Diplopia: Secondary | ICD-10-CM | POA: Diagnosis not present

## 2014-07-15 DIAGNOSIS — H4922 Sixth [abducent] nerve palsy, left eye: Secondary | ICD-10-CM | POA: Diagnosis not present

## 2014-07-15 DIAGNOSIS — H2513 Age-related nuclear cataract, bilateral: Secondary | ICD-10-CM | POA: Diagnosis not present

## 2014-07-19 DIAGNOSIS — S138XXD Sprain of joints and ligaments of other parts of neck, subsequent encounter: Secondary | ICD-10-CM | POA: Diagnosis not present

## 2014-07-19 DIAGNOSIS — M25512 Pain in left shoulder: Secondary | ICD-10-CM | POA: Diagnosis not present

## 2014-07-19 DIAGNOSIS — M542 Cervicalgia: Secondary | ICD-10-CM | POA: Diagnosis not present

## 2014-08-02 DIAGNOSIS — S138XXD Sprain of joints and ligaments of other parts of neck, subsequent encounter: Secondary | ICD-10-CM | POA: Diagnosis not present

## 2014-08-02 DIAGNOSIS — M542 Cervicalgia: Secondary | ICD-10-CM | POA: Diagnosis not present

## 2014-08-02 DIAGNOSIS — M25512 Pain in left shoulder: Secondary | ICD-10-CM | POA: Diagnosis not present

## 2014-08-11 DIAGNOSIS — M542 Cervicalgia: Secondary | ICD-10-CM | POA: Diagnosis not present

## 2014-08-11 DIAGNOSIS — M25512 Pain in left shoulder: Secondary | ICD-10-CM | POA: Diagnosis not present

## 2014-08-11 DIAGNOSIS — S138XXD Sprain of joints and ligaments of other parts of neck, subsequent encounter: Secondary | ICD-10-CM | POA: Diagnosis not present

## 2014-08-18 DIAGNOSIS — M25512 Pain in left shoulder: Secondary | ICD-10-CM | POA: Diagnosis not present

## 2014-08-18 DIAGNOSIS — M542 Cervicalgia: Secondary | ICD-10-CM | POA: Diagnosis not present

## 2014-08-18 DIAGNOSIS — S138XXD Sprain of joints and ligaments of other parts of neck, subsequent encounter: Secondary | ICD-10-CM | POA: Diagnosis not present

## 2014-08-25 DIAGNOSIS — M25512 Pain in left shoulder: Secondary | ICD-10-CM | POA: Diagnosis not present

## 2014-08-25 DIAGNOSIS — S138XXD Sprain of joints and ligaments of other parts of neck, subsequent encounter: Secondary | ICD-10-CM | POA: Diagnosis not present

## 2014-08-25 DIAGNOSIS — M542 Cervicalgia: Secondary | ICD-10-CM | POA: Diagnosis not present

## 2014-08-30 DIAGNOSIS — M542 Cervicalgia: Secondary | ICD-10-CM | POA: Diagnosis not present

## 2014-08-30 DIAGNOSIS — I10 Essential (primary) hypertension: Secondary | ICD-10-CM | POA: Diagnosis not present

## 2014-09-03 DIAGNOSIS — M542 Cervicalgia: Secondary | ICD-10-CM | POA: Diagnosis not present

## 2014-09-03 DIAGNOSIS — S138XXD Sprain of joints and ligaments of other parts of neck, subsequent encounter: Secondary | ICD-10-CM | POA: Diagnosis not present

## 2014-09-03 DIAGNOSIS — M25512 Pain in left shoulder: Secondary | ICD-10-CM | POA: Diagnosis not present

## 2014-09-09 DIAGNOSIS — M542 Cervicalgia: Secondary | ICD-10-CM | POA: Diagnosis not present

## 2014-09-09 DIAGNOSIS — S138XXD Sprain of joints and ligaments of other parts of neck, subsequent encounter: Secondary | ICD-10-CM | POA: Diagnosis not present

## 2014-09-09 DIAGNOSIS — M25512 Pain in left shoulder: Secondary | ICD-10-CM | POA: Diagnosis not present

## 2014-09-16 DIAGNOSIS — M542 Cervicalgia: Secondary | ICD-10-CM | POA: Diagnosis not present

## 2014-09-16 DIAGNOSIS — M25512 Pain in left shoulder: Secondary | ICD-10-CM | POA: Diagnosis not present

## 2014-09-16 DIAGNOSIS — I10 Essential (primary) hypertension: Secondary | ICD-10-CM | POA: Diagnosis not present

## 2014-09-16 DIAGNOSIS — S138XXD Sprain of joints and ligaments of other parts of neck, subsequent encounter: Secondary | ICD-10-CM | POA: Diagnosis not present

## 2014-09-22 DIAGNOSIS — M542 Cervicalgia: Secondary | ICD-10-CM | POA: Diagnosis not present

## 2014-09-22 DIAGNOSIS — S138XXD Sprain of joints and ligaments of other parts of neck, subsequent encounter: Secondary | ICD-10-CM | POA: Diagnosis not present

## 2014-09-22 DIAGNOSIS — M25512 Pain in left shoulder: Secondary | ICD-10-CM | POA: Diagnosis not present

## 2014-09-30 DIAGNOSIS — S138XXD Sprain of joints and ligaments of other parts of neck, subsequent encounter: Secondary | ICD-10-CM | POA: Diagnosis not present

## 2014-09-30 DIAGNOSIS — M25512 Pain in left shoulder: Secondary | ICD-10-CM | POA: Diagnosis not present

## 2014-09-30 DIAGNOSIS — M542 Cervicalgia: Secondary | ICD-10-CM | POA: Diagnosis not present

## 2014-10-04 DIAGNOSIS — M791 Myalgia: Secondary | ICD-10-CM | POA: Diagnosis not present

## 2014-10-04 DIAGNOSIS — M9903 Segmental and somatic dysfunction of lumbar region: Secondary | ICD-10-CM | POA: Diagnosis not present

## 2014-10-04 DIAGNOSIS — M9905 Segmental and somatic dysfunction of pelvic region: Secondary | ICD-10-CM | POA: Diagnosis not present

## 2014-10-04 DIAGNOSIS — M47817 Spondylosis without myelopathy or radiculopathy, lumbosacral region: Secondary | ICD-10-CM | POA: Diagnosis not present

## 2014-10-04 DIAGNOSIS — M9902 Segmental and somatic dysfunction of thoracic region: Secondary | ICD-10-CM | POA: Diagnosis not present

## 2014-10-14 DIAGNOSIS — H532 Diplopia: Secondary | ICD-10-CM | POA: Diagnosis not present

## 2014-10-14 DIAGNOSIS — H4922 Sixth [abducent] nerve palsy, left eye: Secondary | ICD-10-CM | POA: Diagnosis not present

## 2014-10-14 DIAGNOSIS — H2513 Age-related nuclear cataract, bilateral: Secondary | ICD-10-CM | POA: Diagnosis not present

## 2014-12-01 DIAGNOSIS — M25552 Pain in left hip: Secondary | ICD-10-CM | POA: Diagnosis not present

## 2014-12-01 DIAGNOSIS — S138XXD Sprain of joints and ligaments of other parts of neck, subsequent encounter: Secondary | ICD-10-CM | POA: Diagnosis not present

## 2014-12-01 DIAGNOSIS — M25512 Pain in left shoulder: Secondary | ICD-10-CM | POA: Diagnosis not present

## 2014-12-02 DIAGNOSIS — X32XXXA Exposure to sunlight, initial encounter: Secondary | ICD-10-CM | POA: Diagnosis not present

## 2014-12-02 DIAGNOSIS — Z85828 Personal history of other malignant neoplasm of skin: Secondary | ICD-10-CM | POA: Diagnosis not present

## 2014-12-02 DIAGNOSIS — L570 Actinic keratosis: Secondary | ICD-10-CM | POA: Diagnosis not present

## 2014-12-09 DIAGNOSIS — M25552 Pain in left hip: Secondary | ICD-10-CM | POA: Diagnosis not present

## 2014-12-09 DIAGNOSIS — S138XXD Sprain of joints and ligaments of other parts of neck, subsequent encounter: Secondary | ICD-10-CM | POA: Diagnosis not present

## 2014-12-09 DIAGNOSIS — M25512 Pain in left shoulder: Secondary | ICD-10-CM | POA: Diagnosis not present

## 2014-12-17 DIAGNOSIS — S138XXD Sprain of joints and ligaments of other parts of neck, subsequent encounter: Secondary | ICD-10-CM | POA: Diagnosis not present

## 2014-12-17 DIAGNOSIS — M25512 Pain in left shoulder: Secondary | ICD-10-CM | POA: Diagnosis not present

## 2014-12-17 DIAGNOSIS — M25552 Pain in left hip: Secondary | ICD-10-CM | POA: Diagnosis not present

## 2014-12-22 DIAGNOSIS — S138XXD Sprain of joints and ligaments of other parts of neck, subsequent encounter: Secondary | ICD-10-CM | POA: Diagnosis not present

## 2014-12-22 DIAGNOSIS — M25552 Pain in left hip: Secondary | ICD-10-CM | POA: Diagnosis not present

## 2014-12-22 DIAGNOSIS — M25512 Pain in left shoulder: Secondary | ICD-10-CM | POA: Diagnosis not present

## 2015-01-17 DIAGNOSIS — R972 Elevated prostate specific antigen [PSA]: Secondary | ICD-10-CM | POA: Diagnosis not present

## 2015-02-08 DIAGNOSIS — N138 Other obstructive and reflux uropathy: Secondary | ICD-10-CM | POA: Diagnosis not present

## 2015-02-08 DIAGNOSIS — R972 Elevated prostate specific antigen [PSA]: Secondary | ICD-10-CM | POA: Diagnosis not present

## 2015-02-08 DIAGNOSIS — N401 Enlarged prostate with lower urinary tract symptoms: Secondary | ICD-10-CM | POA: Diagnosis not present

## 2015-02-21 DIAGNOSIS — M25552 Pain in left hip: Secondary | ICD-10-CM | POA: Diagnosis not present

## 2015-02-21 DIAGNOSIS — S138XXD Sprain of joints and ligaments of other parts of neck, subsequent encounter: Secondary | ICD-10-CM | POA: Diagnosis not present

## 2015-02-21 DIAGNOSIS — M25512 Pain in left shoulder: Secondary | ICD-10-CM | POA: Diagnosis not present

## 2015-03-03 DIAGNOSIS — M25552 Pain in left hip: Secondary | ICD-10-CM | POA: Diagnosis not present

## 2015-03-03 DIAGNOSIS — M25512 Pain in left shoulder: Secondary | ICD-10-CM | POA: Diagnosis not present

## 2015-03-03 DIAGNOSIS — S138XXD Sprain of joints and ligaments of other parts of neck, subsequent encounter: Secondary | ICD-10-CM | POA: Diagnosis not present

## 2015-03-07 DIAGNOSIS — M791 Myalgia: Secondary | ICD-10-CM | POA: Diagnosis not present

## 2015-03-07 DIAGNOSIS — M7062 Trochanteric bursitis, left hip: Secondary | ICD-10-CM | POA: Diagnosis not present

## 2015-03-08 DIAGNOSIS — M25512 Pain in left shoulder: Secondary | ICD-10-CM | POA: Diagnosis not present

## 2015-03-08 DIAGNOSIS — M25552 Pain in left hip: Secondary | ICD-10-CM | POA: Diagnosis not present

## 2015-03-08 DIAGNOSIS — S138XXD Sprain of joints and ligaments of other parts of neck, subsequent encounter: Secondary | ICD-10-CM | POA: Diagnosis not present

## 2015-03-09 DIAGNOSIS — R142 Eructation: Secondary | ICD-10-CM | POA: Diagnosis not present

## 2015-03-09 DIAGNOSIS — R05 Cough: Secondary | ICD-10-CM | POA: Diagnosis not present

## 2015-03-09 DIAGNOSIS — R066 Hiccough: Secondary | ICD-10-CM | POA: Diagnosis not present

## 2015-03-10 DIAGNOSIS — R310 Gross hematuria: Secondary | ICD-10-CM | POA: Diagnosis not present

## 2015-03-14 DIAGNOSIS — N200 Calculus of kidney: Secondary | ICD-10-CM | POA: Diagnosis not present

## 2015-03-14 DIAGNOSIS — R310 Gross hematuria: Secondary | ICD-10-CM | POA: Diagnosis not present

## 2015-03-21 DIAGNOSIS — S138XXD Sprain of joints and ligaments of other parts of neck, subsequent encounter: Secondary | ICD-10-CM | POA: Diagnosis not present

## 2015-03-21 DIAGNOSIS — M25512 Pain in left shoulder: Secondary | ICD-10-CM | POA: Diagnosis not present

## 2015-03-21 DIAGNOSIS — M25552 Pain in left hip: Secondary | ICD-10-CM | POA: Diagnosis not present

## 2015-03-30 DIAGNOSIS — M25512 Pain in left shoulder: Secondary | ICD-10-CM | POA: Diagnosis not present

## 2015-03-30 DIAGNOSIS — S138XXD Sprain of joints and ligaments of other parts of neck, subsequent encounter: Secondary | ICD-10-CM | POA: Diagnosis not present

## 2015-03-30 DIAGNOSIS — M25552 Pain in left hip: Secondary | ICD-10-CM | POA: Diagnosis not present

## 2015-03-31 DIAGNOSIS — G5702 Lesion of sciatic nerve, left lower limb: Secondary | ICD-10-CM | POA: Diagnosis not present

## 2015-04-01 DIAGNOSIS — K219 Gastro-esophageal reflux disease without esophagitis: Secondary | ICD-10-CM | POA: Diagnosis not present

## 2015-04-01 DIAGNOSIS — R0789 Other chest pain: Secondary | ICD-10-CM | POA: Diagnosis not present

## 2015-04-01 DIAGNOSIS — I10 Essential (primary) hypertension: Secondary | ICD-10-CM | POA: Diagnosis not present

## 2015-04-11 DIAGNOSIS — M25552 Pain in left hip: Secondary | ICD-10-CM | POA: Diagnosis not present

## 2015-04-11 DIAGNOSIS — S138XXD Sprain of joints and ligaments of other parts of neck, subsequent encounter: Secondary | ICD-10-CM | POA: Diagnosis not present

## 2015-04-11 DIAGNOSIS — M25512 Pain in left shoulder: Secondary | ICD-10-CM | POA: Diagnosis not present

## 2015-04-13 DIAGNOSIS — N200 Calculus of kidney: Secondary | ICD-10-CM | POA: Diagnosis not present

## 2015-04-13 DIAGNOSIS — R972 Elevated prostate specific antigen [PSA]: Secondary | ICD-10-CM | POA: Diagnosis not present

## 2015-04-13 DIAGNOSIS — R310 Gross hematuria: Secondary | ICD-10-CM | POA: Diagnosis not present

## 2015-04-21 DIAGNOSIS — M25552 Pain in left hip: Secondary | ICD-10-CM | POA: Diagnosis not present

## 2015-04-21 DIAGNOSIS — M25512 Pain in left shoulder: Secondary | ICD-10-CM | POA: Diagnosis not present

## 2015-04-21 DIAGNOSIS — S138XXD Sprain of joints and ligaments of other parts of neck, subsequent encounter: Secondary | ICD-10-CM | POA: Diagnosis not present

## 2015-05-02 DIAGNOSIS — M25512 Pain in left shoulder: Secondary | ICD-10-CM | POA: Diagnosis not present

## 2015-05-02 DIAGNOSIS — M25552 Pain in left hip: Secondary | ICD-10-CM | POA: Diagnosis not present

## 2015-05-02 DIAGNOSIS — S138XXD Sprain of joints and ligaments of other parts of neck, subsequent encounter: Secondary | ICD-10-CM | POA: Diagnosis not present

## 2015-06-15 DIAGNOSIS — E039 Hypothyroidism, unspecified: Secondary | ICD-10-CM | POA: Diagnosis not present

## 2015-06-15 DIAGNOSIS — Z125 Encounter for screening for malignant neoplasm of prostate: Secondary | ICD-10-CM | POA: Diagnosis not present

## 2015-06-15 DIAGNOSIS — Z0000 Encounter for general adult medical examination without abnormal findings: Secondary | ICD-10-CM | POA: Diagnosis not present

## 2015-06-15 DIAGNOSIS — K219 Gastro-esophageal reflux disease without esophagitis: Secondary | ICD-10-CM | POA: Diagnosis not present

## 2015-06-15 DIAGNOSIS — I10 Essential (primary) hypertension: Secondary | ICD-10-CM | POA: Diagnosis not present

## 2015-06-16 DIAGNOSIS — E039 Hypothyroidism, unspecified: Secondary | ICD-10-CM | POA: Diagnosis not present

## 2015-06-16 DIAGNOSIS — X32XXXA Exposure to sunlight, initial encounter: Secondary | ICD-10-CM | POA: Diagnosis not present

## 2015-06-16 DIAGNOSIS — L821 Other seborrheic keratosis: Secondary | ICD-10-CM | POA: Diagnosis not present

## 2015-06-16 DIAGNOSIS — I10 Essential (primary) hypertension: Secondary | ICD-10-CM | POA: Diagnosis not present

## 2015-06-16 DIAGNOSIS — Z125 Encounter for screening for malignant neoplasm of prostate: Secondary | ICD-10-CM | POA: Diagnosis not present

## 2015-06-16 DIAGNOSIS — D1801 Hemangioma of skin and subcutaneous tissue: Secondary | ICD-10-CM | POA: Diagnosis not present

## 2015-06-16 DIAGNOSIS — Z85828 Personal history of other malignant neoplasm of skin: Secondary | ICD-10-CM | POA: Diagnosis not present

## 2015-06-16 DIAGNOSIS — K219 Gastro-esophageal reflux disease without esophagitis: Secondary | ICD-10-CM | POA: Diagnosis not present

## 2015-06-16 DIAGNOSIS — L570 Actinic keratosis: Secondary | ICD-10-CM | POA: Diagnosis not present

## 2015-06-20 DIAGNOSIS — H532 Diplopia: Secondary | ICD-10-CM | POA: Diagnosis not present

## 2015-06-20 DIAGNOSIS — L603 Nail dystrophy: Secondary | ICD-10-CM | POA: Diagnosis not present

## 2015-06-20 DIAGNOSIS — M792 Neuralgia and neuritis, unspecified: Secondary | ICD-10-CM | POA: Diagnosis not present

## 2015-06-20 DIAGNOSIS — R292 Abnormal reflex: Secondary | ICD-10-CM | POA: Diagnosis not present

## 2015-06-20 DIAGNOSIS — M542 Cervicalgia: Secondary | ICD-10-CM | POA: Diagnosis not present

## 2015-06-22 DIAGNOSIS — M4604 Spinal enthesopathy, thoracic region: Secondary | ICD-10-CM | POA: Diagnosis not present

## 2015-06-22 DIAGNOSIS — M9905 Segmental and somatic dysfunction of pelvic region: Secondary | ICD-10-CM | POA: Diagnosis not present

## 2015-06-22 DIAGNOSIS — M5431 Sciatica, right side: Secondary | ICD-10-CM | POA: Diagnosis not present

## 2015-06-22 DIAGNOSIS — M9903 Segmental and somatic dysfunction of lumbar region: Secondary | ICD-10-CM | POA: Diagnosis not present

## 2015-06-22 DIAGNOSIS — M47814 Spondylosis without myelopathy or radiculopathy, thoracic region: Secondary | ICD-10-CM | POA: Diagnosis not present

## 2015-06-22 DIAGNOSIS — M9902 Segmental and somatic dysfunction of thoracic region: Secondary | ICD-10-CM | POA: Diagnosis not present

## 2015-06-22 DIAGNOSIS — M4716 Other spondylosis with myelopathy, lumbar region: Secondary | ICD-10-CM | POA: Diagnosis not present

## 2015-07-13 DIAGNOSIS — M25552 Pain in left hip: Secondary | ICD-10-CM | POA: Diagnosis not present

## 2015-07-20 DIAGNOSIS — M25552 Pain in left hip: Secondary | ICD-10-CM | POA: Diagnosis not present

## 2015-08-03 DIAGNOSIS — M25552 Pain in left hip: Secondary | ICD-10-CM | POA: Diagnosis not present

## 2015-08-11 DIAGNOSIS — M25552 Pain in left hip: Secondary | ICD-10-CM | POA: Diagnosis not present

## 2015-08-19 DIAGNOSIS — M25552 Pain in left hip: Secondary | ICD-10-CM | POA: Diagnosis not present

## 2015-08-23 DIAGNOSIS — X32XXXA Exposure to sunlight, initial encounter: Secondary | ICD-10-CM | POA: Diagnosis not present

## 2015-08-23 DIAGNOSIS — C44329 Squamous cell carcinoma of skin of other parts of face: Secondary | ICD-10-CM | POA: Diagnosis not present

## 2015-08-23 DIAGNOSIS — D485 Neoplasm of uncertain behavior of skin: Secondary | ICD-10-CM | POA: Diagnosis not present

## 2015-08-23 DIAGNOSIS — L570 Actinic keratosis: Secondary | ICD-10-CM | POA: Diagnosis not present

## 2015-08-23 DIAGNOSIS — C4442 Squamous cell carcinoma of skin of scalp and neck: Secondary | ICD-10-CM | POA: Diagnosis not present

## 2015-09-12 DIAGNOSIS — M25552 Pain in left hip: Secondary | ICD-10-CM | POA: Diagnosis not present

## 2015-09-19 DIAGNOSIS — Z85828 Personal history of other malignant neoplasm of skin: Secondary | ICD-10-CM | POA: Diagnosis not present

## 2015-09-19 DIAGNOSIS — C44329 Squamous cell carcinoma of skin of other parts of face: Secondary | ICD-10-CM | POA: Diagnosis not present

## 2015-09-19 DIAGNOSIS — C4442 Squamous cell carcinoma of skin of scalp and neck: Secondary | ICD-10-CM | POA: Diagnosis not present

## 2015-09-19 DIAGNOSIS — M25552 Pain in left hip: Secondary | ICD-10-CM | POA: Diagnosis not present

## 2015-10-25 DIAGNOSIS — M25552 Pain in left hip: Secondary | ICD-10-CM | POA: Diagnosis not present

## 2015-11-03 DIAGNOSIS — M25552 Pain in left hip: Secondary | ICD-10-CM | POA: Diagnosis not present

## 2015-11-10 DIAGNOSIS — M25552 Pain in left hip: Secondary | ICD-10-CM | POA: Diagnosis not present

## 2015-11-28 DIAGNOSIS — M25552 Pain in left hip: Secondary | ICD-10-CM | POA: Diagnosis not present

## 2015-12-05 DIAGNOSIS — M25552 Pain in left hip: Secondary | ICD-10-CM | POA: Diagnosis not present

## 2015-12-09 DIAGNOSIS — M25552 Pain in left hip: Secondary | ICD-10-CM | POA: Diagnosis not present

## 2015-12-15 DIAGNOSIS — H2513 Age-related nuclear cataract, bilateral: Secondary | ICD-10-CM | POA: Diagnosis not present

## 2015-12-15 DIAGNOSIS — X32XXXA Exposure to sunlight, initial encounter: Secondary | ICD-10-CM | POA: Diagnosis not present

## 2015-12-15 DIAGNOSIS — D3132 Benign neoplasm of left choroid: Secondary | ICD-10-CM | POA: Diagnosis not present

## 2015-12-15 DIAGNOSIS — Z85828 Personal history of other malignant neoplasm of skin: Secondary | ICD-10-CM | POA: Diagnosis not present

## 2015-12-15 DIAGNOSIS — H11153 Pinguecula, bilateral: Secondary | ICD-10-CM | POA: Diagnosis not present

## 2015-12-15 DIAGNOSIS — L821 Other seborrheic keratosis: Secondary | ICD-10-CM | POA: Diagnosis not present

## 2015-12-15 DIAGNOSIS — L820 Inflamed seborrheic keratosis: Secondary | ICD-10-CM | POA: Diagnosis not present

## 2015-12-15 DIAGNOSIS — L538 Other specified erythematous conditions: Secondary | ICD-10-CM | POA: Diagnosis not present

## 2015-12-15 DIAGNOSIS — Z08 Encounter for follow-up examination after completed treatment for malignant neoplasm: Secondary | ICD-10-CM | POA: Diagnosis not present

## 2015-12-15 DIAGNOSIS — L570 Actinic keratosis: Secondary | ICD-10-CM | POA: Diagnosis not present

## 2015-12-21 DIAGNOSIS — M5481 Occipital neuralgia: Secondary | ICD-10-CM | POA: Diagnosis not present

## 2016-01-17 DIAGNOSIS — M25552 Pain in left hip: Secondary | ICD-10-CM | POA: Diagnosis not present

## 2016-01-26 DIAGNOSIS — M25552 Pain in left hip: Secondary | ICD-10-CM | POA: Diagnosis not present

## 2016-02-08 DIAGNOSIS — M25552 Pain in left hip: Secondary | ICD-10-CM | POA: Diagnosis not present

## 2016-02-24 DIAGNOSIS — M25552 Pain in left hip: Secondary | ICD-10-CM | POA: Diagnosis not present

## 2016-03-13 DIAGNOSIS — K219 Gastro-esophageal reflux disease without esophagitis: Secondary | ICD-10-CM | POA: Diagnosis not present

## 2016-03-13 DIAGNOSIS — Z86010 Personal history of colonic polyps: Secondary | ICD-10-CM | POA: Diagnosis not present

## 2016-03-15 DIAGNOSIS — M4722 Other spondylosis with radiculopathy, cervical region: Secondary | ICD-10-CM | POA: Diagnosis not present

## 2016-03-15 DIAGNOSIS — M50221 Other cervical disc displacement at C4-C5 level: Secondary | ICD-10-CM | POA: Diagnosis not present

## 2016-03-19 DIAGNOSIS — M25552 Pain in left hip: Secondary | ICD-10-CM | POA: Diagnosis not present

## 2016-03-20 DIAGNOSIS — Z23 Encounter for immunization: Secondary | ICD-10-CM | POA: Diagnosis not present

## 2016-03-27 DIAGNOSIS — M25552 Pain in left hip: Secondary | ICD-10-CM | POA: Diagnosis not present

## 2016-03-28 DIAGNOSIS — M25552 Pain in left hip: Secondary | ICD-10-CM | POA: Diagnosis not present

## 2016-04-02 DIAGNOSIS — M25552 Pain in left hip: Secondary | ICD-10-CM | POA: Diagnosis not present

## 2016-04-03 DIAGNOSIS — M542 Cervicalgia: Secondary | ICD-10-CM | POA: Diagnosis not present

## 2016-04-03 DIAGNOSIS — M545 Low back pain: Secondary | ICD-10-CM | POA: Diagnosis not present

## 2016-04-03 DIAGNOSIS — M25552 Pain in left hip: Secondary | ICD-10-CM | POA: Diagnosis not present

## 2016-04-04 DIAGNOSIS — Z981 Arthrodesis status: Secondary | ICD-10-CM | POA: Diagnosis not present

## 2016-04-04 DIAGNOSIS — M5136 Other intervertebral disc degeneration, lumbar region: Secondary | ICD-10-CM | POA: Diagnosis not present

## 2016-04-04 DIAGNOSIS — M4312 Spondylolisthesis, cervical region: Secondary | ICD-10-CM | POA: Diagnosis not present

## 2016-04-27 DIAGNOSIS — N200 Calculus of kidney: Secondary | ICD-10-CM | POA: Diagnosis not present

## 2016-04-27 DIAGNOSIS — N401 Enlarged prostate with lower urinary tract symptoms: Secondary | ICD-10-CM | POA: Diagnosis not present

## 2016-04-27 DIAGNOSIS — R350 Frequency of micturition: Secondary | ICD-10-CM | POA: Diagnosis not present

## 2016-05-15 DIAGNOSIS — Z86010 Personal history of colonic polyps: Secondary | ICD-10-CM | POA: Diagnosis not present

## 2016-05-15 DIAGNOSIS — D125 Benign neoplasm of sigmoid colon: Secondary | ICD-10-CM | POA: Diagnosis not present

## 2016-05-15 DIAGNOSIS — K635 Polyp of colon: Secondary | ICD-10-CM | POA: Diagnosis not present

## 2016-05-15 DIAGNOSIS — K5730 Diverticulosis of large intestine without perforation or abscess without bleeding: Secondary | ICD-10-CM | POA: Diagnosis not present

## 2016-05-18 DIAGNOSIS — K635 Polyp of colon: Secondary | ICD-10-CM | POA: Diagnosis not present

## 2016-06-14 DIAGNOSIS — N200 Calculus of kidney: Secondary | ICD-10-CM | POA: Diagnosis not present

## 2016-06-14 DIAGNOSIS — N281 Cyst of kidney, acquired: Secondary | ICD-10-CM | POA: Diagnosis not present

## 2016-06-14 DIAGNOSIS — R1084 Generalized abdominal pain: Secondary | ICD-10-CM | POA: Diagnosis not present

## 2016-06-15 DIAGNOSIS — M542 Cervicalgia: Secondary | ICD-10-CM | POA: Diagnosis not present

## 2016-06-15 DIAGNOSIS — S233XXD Sprain of ligaments of thoracic spine, subsequent encounter: Secondary | ICD-10-CM | POA: Diagnosis not present

## 2016-06-15 DIAGNOSIS — M545 Low back pain: Secondary | ICD-10-CM | POA: Diagnosis not present

## 2016-06-18 DIAGNOSIS — N390 Urinary tract infection, site not specified: Secondary | ICD-10-CM | POA: Diagnosis not present

## 2016-06-18 DIAGNOSIS — I10 Essential (primary) hypertension: Secondary | ICD-10-CM | POA: Diagnosis not present

## 2016-06-18 DIAGNOSIS — Z7982 Long term (current) use of aspirin: Secondary | ICD-10-CM | POA: Diagnosis not present

## 2016-06-18 DIAGNOSIS — M8580 Other specified disorders of bone density and structure, unspecified site: Secondary | ICD-10-CM | POA: Diagnosis not present

## 2016-06-18 DIAGNOSIS — Z125 Encounter for screening for malignant neoplasm of prostate: Secondary | ICD-10-CM | POA: Diagnosis not present

## 2016-06-18 DIAGNOSIS — E559 Vitamin D deficiency, unspecified: Secondary | ICD-10-CM | POA: Diagnosis not present

## 2016-06-20 DIAGNOSIS — M4722 Other spondylosis with radiculopathy, cervical region: Secondary | ICD-10-CM | POA: Diagnosis not present

## 2016-06-20 DIAGNOSIS — Z6828 Body mass index (BMI) 28.0-28.9, adult: Secondary | ICD-10-CM | POA: Diagnosis not present

## 2016-06-20 DIAGNOSIS — R030 Elevated blood-pressure reading, without diagnosis of hypertension: Secondary | ICD-10-CM | POA: Diagnosis not present

## 2016-06-21 DIAGNOSIS — D0462 Carcinoma in situ of skin of left upper limb, including shoulder: Secondary | ICD-10-CM | POA: Diagnosis not present

## 2016-06-21 DIAGNOSIS — X32XXXA Exposure to sunlight, initial encounter: Secondary | ICD-10-CM | POA: Diagnosis not present

## 2016-06-21 DIAGNOSIS — D044 Carcinoma in situ of skin of scalp and neck: Secondary | ICD-10-CM | POA: Diagnosis not present

## 2016-06-21 DIAGNOSIS — Z08 Encounter for follow-up examination after completed treatment for malignant neoplasm: Secondary | ICD-10-CM | POA: Diagnosis not present

## 2016-06-21 DIAGNOSIS — Z85828 Personal history of other malignant neoplasm of skin: Secondary | ICD-10-CM | POA: Diagnosis not present

## 2016-06-21 DIAGNOSIS — L853 Xerosis cutis: Secondary | ICD-10-CM | POA: Diagnosis not present

## 2016-06-21 DIAGNOSIS — L570 Actinic keratosis: Secondary | ICD-10-CM | POA: Diagnosis not present

## 2016-06-21 DIAGNOSIS — D485 Neoplasm of uncertain behavior of skin: Secondary | ICD-10-CM | POA: Diagnosis not present

## 2016-06-25 DIAGNOSIS — N401 Enlarged prostate with lower urinary tract symptoms: Secondary | ICD-10-CM | POA: Diagnosis not present

## 2016-06-25 DIAGNOSIS — I10 Essential (primary) hypertension: Secondary | ICD-10-CM | POA: Diagnosis not present

## 2016-06-25 DIAGNOSIS — R351 Nocturia: Secondary | ICD-10-CM | POA: Diagnosis not present

## 2016-06-25 DIAGNOSIS — N3020 Other chronic cystitis without hematuria: Secondary | ICD-10-CM | POA: Diagnosis not present

## 2016-06-25 DIAGNOSIS — Z87442 Personal history of urinary calculi: Secondary | ICD-10-CM | POA: Diagnosis not present

## 2016-06-25 DIAGNOSIS — R3911 Hesitancy of micturition: Secondary | ICD-10-CM | POA: Diagnosis not present

## 2016-06-25 DIAGNOSIS — Z7982 Long term (current) use of aspirin: Secondary | ICD-10-CM | POA: Diagnosis not present

## 2016-06-25 DIAGNOSIS — Z86010 Personal history of colonic polyps: Secondary | ICD-10-CM | POA: Diagnosis not present

## 2016-06-25 DIAGNOSIS — E039 Hypothyroidism, unspecified: Secondary | ICD-10-CM | POA: Diagnosis not present

## 2016-06-29 DIAGNOSIS — M545 Low back pain: Secondary | ICD-10-CM | POA: Diagnosis not present

## 2016-06-29 DIAGNOSIS — S233XXD Sprain of ligaments of thoracic spine, subsequent encounter: Secondary | ICD-10-CM | POA: Diagnosis not present

## 2016-06-29 DIAGNOSIS — M542 Cervicalgia: Secondary | ICD-10-CM | POA: Diagnosis not present

## 2016-07-09 DIAGNOSIS — S233XXD Sprain of ligaments of thoracic spine, subsequent encounter: Secondary | ICD-10-CM | POA: Diagnosis not present

## 2016-07-09 DIAGNOSIS — M545 Low back pain: Secondary | ICD-10-CM | POA: Diagnosis not present

## 2016-07-09 DIAGNOSIS — M542 Cervicalgia: Secondary | ICD-10-CM | POA: Diagnosis not present

## 2016-07-13 DIAGNOSIS — R946 Abnormal results of thyroid function studies: Secondary | ICD-10-CM | POA: Diagnosis not present

## 2016-07-13 DIAGNOSIS — E039 Hypothyroidism, unspecified: Secondary | ICD-10-CM | POA: Diagnosis not present

## 2016-07-19 DIAGNOSIS — D044 Carcinoma in situ of skin of scalp and neck: Secondary | ICD-10-CM | POA: Diagnosis not present

## 2016-07-19 DIAGNOSIS — D0462 Carcinoma in situ of skin of left upper limb, including shoulder: Secondary | ICD-10-CM | POA: Diagnosis not present

## 2016-07-20 DIAGNOSIS — M545 Low back pain: Secondary | ICD-10-CM | POA: Diagnosis not present

## 2016-07-20 DIAGNOSIS — S233XXD Sprain of ligaments of thoracic spine, subsequent encounter: Secondary | ICD-10-CM | POA: Diagnosis not present

## 2016-07-20 DIAGNOSIS — M542 Cervicalgia: Secondary | ICD-10-CM | POA: Diagnosis not present

## 2016-07-27 DIAGNOSIS — M542 Cervicalgia: Secondary | ICD-10-CM | POA: Diagnosis not present

## 2016-07-27 DIAGNOSIS — M545 Low back pain: Secondary | ICD-10-CM | POA: Diagnosis not present

## 2016-07-27 DIAGNOSIS — S233XXD Sprain of ligaments of thoracic spine, subsequent encounter: Secondary | ICD-10-CM | POA: Diagnosis not present

## 2016-07-31 DIAGNOSIS — L570 Actinic keratosis: Secondary | ICD-10-CM | POA: Diagnosis not present

## 2016-08-03 DIAGNOSIS — S233XXD Sprain of ligaments of thoracic spine, subsequent encounter: Secondary | ICD-10-CM | POA: Diagnosis not present

## 2016-08-03 DIAGNOSIS — M545 Low back pain: Secondary | ICD-10-CM | POA: Diagnosis not present

## 2016-08-03 DIAGNOSIS — M542 Cervicalgia: Secondary | ICD-10-CM | POA: Diagnosis not present

## 2016-08-06 DIAGNOSIS — M791 Myalgia: Secondary | ICD-10-CM | POA: Diagnosis not present

## 2016-08-06 DIAGNOSIS — M533 Sacrococcygeal disorders, not elsewhere classified: Secondary | ICD-10-CM | POA: Diagnosis not present

## 2016-08-13 DIAGNOSIS — M542 Cervicalgia: Secondary | ICD-10-CM | POA: Diagnosis not present

## 2016-08-13 DIAGNOSIS — M545 Low back pain: Secondary | ICD-10-CM | POA: Diagnosis not present

## 2016-08-13 DIAGNOSIS — S233XXD Sprain of ligaments of thoracic spine, subsequent encounter: Secondary | ICD-10-CM | POA: Diagnosis not present

## 2016-08-27 DIAGNOSIS — M545 Low back pain: Secondary | ICD-10-CM | POA: Diagnosis not present

## 2016-08-27 DIAGNOSIS — S233XXD Sprain of ligaments of thoracic spine, subsequent encounter: Secondary | ICD-10-CM | POA: Diagnosis not present

## 2016-08-27 DIAGNOSIS — M542 Cervicalgia: Secondary | ICD-10-CM | POA: Diagnosis not present

## 2016-08-28 DIAGNOSIS — S233XXD Sprain of ligaments of thoracic spine, subsequent encounter: Secondary | ICD-10-CM | POA: Diagnosis not present

## 2016-08-28 DIAGNOSIS — M545 Low back pain: Secondary | ICD-10-CM | POA: Diagnosis not present

## 2016-08-28 DIAGNOSIS — M542 Cervicalgia: Secondary | ICD-10-CM | POA: Diagnosis not present

## 2016-09-04 DIAGNOSIS — S233XXD Sprain of ligaments of thoracic spine, subsequent encounter: Secondary | ICD-10-CM | POA: Diagnosis not present

## 2016-09-04 DIAGNOSIS — M545 Low back pain: Secondary | ICD-10-CM | POA: Diagnosis not present

## 2016-09-04 DIAGNOSIS — M542 Cervicalgia: Secondary | ICD-10-CM | POA: Diagnosis not present

## 2016-09-05 DIAGNOSIS — L570 Actinic keratosis: Secondary | ICD-10-CM | POA: Diagnosis not present

## 2016-09-11 DIAGNOSIS — L309 Dermatitis, unspecified: Secondary | ICD-10-CM | POA: Diagnosis not present

## 2016-09-12 DIAGNOSIS — S233XXD Sprain of ligaments of thoracic spine, subsequent encounter: Secondary | ICD-10-CM | POA: Diagnosis not present

## 2016-09-12 DIAGNOSIS — M545 Low back pain: Secondary | ICD-10-CM | POA: Diagnosis not present

## 2016-09-12 DIAGNOSIS — M542 Cervicalgia: Secondary | ICD-10-CM | POA: Diagnosis not present

## 2016-09-18 DIAGNOSIS — S233XXD Sprain of ligaments of thoracic spine, subsequent encounter: Secondary | ICD-10-CM | POA: Diagnosis not present

## 2016-09-18 DIAGNOSIS — M542 Cervicalgia: Secondary | ICD-10-CM | POA: Diagnosis not present

## 2016-09-18 DIAGNOSIS — M545 Low back pain: Secondary | ICD-10-CM | POA: Diagnosis not present

## 2016-09-25 DIAGNOSIS — S233XXD Sprain of ligaments of thoracic spine, subsequent encounter: Secondary | ICD-10-CM | POA: Diagnosis not present

## 2016-09-25 DIAGNOSIS — M542 Cervicalgia: Secondary | ICD-10-CM | POA: Diagnosis not present

## 2016-09-25 DIAGNOSIS — M545 Low back pain: Secondary | ICD-10-CM | POA: Diagnosis not present

## 2016-11-01 DIAGNOSIS — M545 Low back pain: Secondary | ICD-10-CM | POA: Diagnosis not present

## 2016-11-01 DIAGNOSIS — M542 Cervicalgia: Secondary | ICD-10-CM | POA: Diagnosis not present

## 2016-11-01 DIAGNOSIS — S233XXD Sprain of ligaments of thoracic spine, subsequent encounter: Secondary | ICD-10-CM | POA: Diagnosis not present

## 2016-11-06 DIAGNOSIS — M545 Low back pain: Secondary | ICD-10-CM | POA: Diagnosis not present

## 2016-11-06 DIAGNOSIS — M542 Cervicalgia: Secondary | ICD-10-CM | POA: Diagnosis not present

## 2016-11-06 DIAGNOSIS — S233XXD Sprain of ligaments of thoracic spine, subsequent encounter: Secondary | ICD-10-CM | POA: Diagnosis not present

## 2016-11-12 DIAGNOSIS — R5383 Other fatigue: Secondary | ICD-10-CM | POA: Diagnosis not present

## 2016-11-12 DIAGNOSIS — E039 Hypothyroidism, unspecified: Secondary | ICD-10-CM | POA: Diagnosis not present

## 2016-11-12 DIAGNOSIS — I959 Hypotension, unspecified: Secondary | ICD-10-CM | POA: Diagnosis not present

## 2016-11-12 DIAGNOSIS — I10 Essential (primary) hypertension: Secondary | ICD-10-CM | POA: Diagnosis not present

## 2016-11-15 DIAGNOSIS — I959 Hypotension, unspecified: Secondary | ICD-10-CM | POA: Diagnosis not present

## 2016-11-15 DIAGNOSIS — R946 Abnormal results of thyroid function studies: Secondary | ICD-10-CM | POA: Diagnosis not present

## 2016-11-15 DIAGNOSIS — I10 Essential (primary) hypertension: Secondary | ICD-10-CM | POA: Diagnosis not present

## 2016-11-22 DIAGNOSIS — S233XXD Sprain of ligaments of thoracic spine, subsequent encounter: Secondary | ICD-10-CM | POA: Diagnosis not present

## 2016-11-22 DIAGNOSIS — M545 Low back pain: Secondary | ICD-10-CM | POA: Diagnosis not present

## 2016-11-22 DIAGNOSIS — M542 Cervicalgia: Secondary | ICD-10-CM | POA: Diagnosis not present

## 2016-12-03 DIAGNOSIS — M545 Low back pain: Secondary | ICD-10-CM | POA: Diagnosis not present

## 2016-12-03 DIAGNOSIS — M542 Cervicalgia: Secondary | ICD-10-CM | POA: Diagnosis not present

## 2016-12-03 DIAGNOSIS — S233XXD Sprain of ligaments of thoracic spine, subsequent encounter: Secondary | ICD-10-CM | POA: Diagnosis not present

## 2016-12-18 DIAGNOSIS — M545 Low back pain: Secondary | ICD-10-CM | POA: Diagnosis not present

## 2016-12-18 DIAGNOSIS — S233XXD Sprain of ligaments of thoracic spine, subsequent encounter: Secondary | ICD-10-CM | POA: Diagnosis not present

## 2016-12-18 DIAGNOSIS — M542 Cervicalgia: Secondary | ICD-10-CM | POA: Diagnosis not present

## 2016-12-19 DIAGNOSIS — N401 Enlarged prostate with lower urinary tract symptoms: Secondary | ICD-10-CM | POA: Diagnosis not present

## 2016-12-19 DIAGNOSIS — R351 Nocturia: Secondary | ICD-10-CM | POA: Diagnosis not present

## 2016-12-19 DIAGNOSIS — N200 Calculus of kidney: Secondary | ICD-10-CM | POA: Diagnosis not present

## 2016-12-20 DIAGNOSIS — B354 Tinea corporis: Secondary | ICD-10-CM | POA: Diagnosis not present

## 2016-12-20 DIAGNOSIS — Z85828 Personal history of other malignant neoplasm of skin: Secondary | ICD-10-CM | POA: Diagnosis not present

## 2016-12-20 DIAGNOSIS — D2261 Melanocytic nevi of right upper limb, including shoulder: Secondary | ICD-10-CM | POA: Diagnosis not present

## 2016-12-20 DIAGNOSIS — L821 Other seborrheic keratosis: Secondary | ICD-10-CM | POA: Diagnosis not present

## 2016-12-31 DIAGNOSIS — S233XXD Sprain of ligaments of thoracic spine, subsequent encounter: Secondary | ICD-10-CM | POA: Diagnosis not present

## 2016-12-31 DIAGNOSIS — M545 Low back pain: Secondary | ICD-10-CM | POA: Diagnosis not present

## 2016-12-31 DIAGNOSIS — M542 Cervicalgia: Secondary | ICD-10-CM | POA: Diagnosis not present

## 2017-01-10 DIAGNOSIS — M545 Low back pain: Secondary | ICD-10-CM | POA: Diagnosis not present

## 2017-01-10 DIAGNOSIS — S233XXD Sprain of ligaments of thoracic spine, subsequent encounter: Secondary | ICD-10-CM | POA: Diagnosis not present

## 2017-01-10 DIAGNOSIS — M542 Cervicalgia: Secondary | ICD-10-CM | POA: Diagnosis not present

## 2017-01-14 ENCOUNTER — Other Ambulatory Visit (HOSPITAL_COMMUNITY): Payer: Self-pay | Admitting: Endocrinology

## 2017-01-14 DIAGNOSIS — E0590 Thyrotoxicosis, unspecified without thyrotoxic crisis or storm: Secondary | ICD-10-CM | POA: Diagnosis not present

## 2017-01-14 DIAGNOSIS — M859 Disorder of bone density and structure, unspecified: Secondary | ICD-10-CM | POA: Diagnosis not present

## 2017-01-14 DIAGNOSIS — M8580 Other specified disorders of bone density and structure, unspecified site: Secondary | ICD-10-CM | POA: Diagnosis not present

## 2017-01-22 ENCOUNTER — Encounter (HOSPITAL_COMMUNITY)
Admission: RE | Admit: 2017-01-22 | Discharge: 2017-01-22 | Disposition: A | Discharge status: Home / Self Care | Payer: Medicare Other | Source: Ambulatory Visit | Attending: Endocrinology | Admitting: Endocrinology

## 2017-01-22 DIAGNOSIS — E0590 Thyrotoxicosis, unspecified without thyrotoxic crisis or storm: Secondary | ICD-10-CM | POA: Insufficient documentation

## 2017-01-22 MED ORDER — SODIUM IODIDE I 131 CAPSULE
6.7000 | Freq: Once | INTRAVENOUS | Status: AC | PRN
  Administered 2017-01-22: 6.7 via ORAL

## 2017-01-23 ENCOUNTER — Encounter (HOSPITAL_COMMUNITY)
Admission: RE | Admit: 2017-01-23 | Discharge: 2017-01-23 | Disposition: A | Discharge status: Home / Self Care | Payer: Medicare Other | Source: Ambulatory Visit | Attending: Endocrinology | Admitting: Endocrinology

## 2017-01-23 DIAGNOSIS — E039 Hypothyroidism, unspecified: Secondary | ICD-10-CM | POA: Diagnosis not present

## 2017-01-23 DIAGNOSIS — E0590 Thyrotoxicosis, unspecified without thyrotoxic crisis or storm: Secondary | ICD-10-CM | POA: Diagnosis not present

## 2017-01-23 MED ORDER — SODIUM PERTECHNETATE TC 99M INJECTION
10.0000 | Freq: Once | INTRAVENOUS | Status: AC | PRN
  Administered 2017-01-23: 10 via INTRAVENOUS

## 2017-02-18 DIAGNOSIS — S233XXD Sprain of ligaments of thoracic spine, subsequent encounter: Secondary | ICD-10-CM | POA: Diagnosis not present

## 2017-02-18 DIAGNOSIS — M545 Low back pain: Secondary | ICD-10-CM | POA: Diagnosis not present

## 2017-02-18 DIAGNOSIS — M542 Cervicalgia: Secondary | ICD-10-CM | POA: Diagnosis not present

## 2017-02-25 DIAGNOSIS — E0590 Thyrotoxicosis, unspecified without thyrotoxic crisis or storm: Secondary | ICD-10-CM | POA: Diagnosis not present

## 2017-03-01 DIAGNOSIS — M542 Cervicalgia: Secondary | ICD-10-CM | POA: Diagnosis not present

## 2017-03-01 DIAGNOSIS — S233XXD Sprain of ligaments of thoracic spine, subsequent encounter: Secondary | ICD-10-CM | POA: Diagnosis not present

## 2017-03-01 DIAGNOSIS — M545 Low back pain: Secondary | ICD-10-CM | POA: Diagnosis not present

## 2017-03-11 DIAGNOSIS — S233XXD Sprain of ligaments of thoracic spine, subsequent encounter: Secondary | ICD-10-CM | POA: Diagnosis not present

## 2017-03-11 DIAGNOSIS — M542 Cervicalgia: Secondary | ICD-10-CM | POA: Diagnosis not present

## 2017-03-11 DIAGNOSIS — M545 Low back pain: Secondary | ICD-10-CM | POA: Diagnosis not present

## 2017-03-25 DIAGNOSIS — Z23 Encounter for immunization: Secondary | ICD-10-CM | POA: Diagnosis not present

## 2017-04-02 DIAGNOSIS — M545 Low back pain: Secondary | ICD-10-CM | POA: Diagnosis not present

## 2017-04-02 DIAGNOSIS — M542 Cervicalgia: Secondary | ICD-10-CM | POA: Diagnosis not present

## 2017-04-02 DIAGNOSIS — S233XXD Sprain of ligaments of thoracic spine, subsequent encounter: Secondary | ICD-10-CM | POA: Diagnosis not present

## 2017-04-08 DIAGNOSIS — E0590 Thyrotoxicosis, unspecified without thyrotoxic crisis or storm: Secondary | ICD-10-CM | POA: Diagnosis not present

## 2017-04-22 DIAGNOSIS — M545 Low back pain: Secondary | ICD-10-CM | POA: Diagnosis not present

## 2017-04-22 DIAGNOSIS — M542 Cervicalgia: Secondary | ICD-10-CM | POA: Diagnosis not present

## 2017-04-22 DIAGNOSIS — S233XXD Sprain of ligaments of thoracic spine, subsequent encounter: Secondary | ICD-10-CM | POA: Diagnosis not present

## 2017-05-01 DIAGNOSIS — S233XXD Sprain of ligaments of thoracic spine, subsequent encounter: Secondary | ICD-10-CM | POA: Diagnosis not present

## 2017-05-01 DIAGNOSIS — M542 Cervicalgia: Secondary | ICD-10-CM | POA: Diagnosis not present

## 2017-05-01 DIAGNOSIS — M545 Low back pain: Secondary | ICD-10-CM | POA: Diagnosis not present

## 2017-05-22 DIAGNOSIS — M545 Low back pain: Secondary | ICD-10-CM | POA: Diagnosis not present

## 2017-05-22 DIAGNOSIS — S233XXD Sprain of ligaments of thoracic spine, subsequent encounter: Secondary | ICD-10-CM | POA: Diagnosis not present

## 2017-05-22 DIAGNOSIS — M542 Cervicalgia: Secondary | ICD-10-CM | POA: Diagnosis not present

## 2017-06-12 DIAGNOSIS — S233XXD Sprain of ligaments of thoracic spine, subsequent encounter: Secondary | ICD-10-CM | POA: Diagnosis not present

## 2017-06-12 DIAGNOSIS — M545 Low back pain: Secondary | ICD-10-CM | POA: Diagnosis not present

## 2017-06-12 DIAGNOSIS — M542 Cervicalgia: Secondary | ICD-10-CM | POA: Diagnosis not present

## 2017-06-19 DIAGNOSIS — M542 Cervicalgia: Secondary | ICD-10-CM | POA: Diagnosis not present

## 2017-06-19 DIAGNOSIS — M545 Low back pain: Secondary | ICD-10-CM | POA: Diagnosis not present

## 2017-06-19 DIAGNOSIS — S233XXD Sprain of ligaments of thoracic spine, subsequent encounter: Secondary | ICD-10-CM | POA: Diagnosis not present

## 2017-06-21 DIAGNOSIS — M533 Sacrococcygeal disorders, not elsewhere classified: Secondary | ICD-10-CM | POA: Diagnosis not present

## 2017-06-25 DIAGNOSIS — H532 Diplopia: Secondary | ICD-10-CM | POA: Diagnosis not present

## 2017-06-25 DIAGNOSIS — H2513 Age-related nuclear cataract, bilateral: Secondary | ICD-10-CM | POA: Diagnosis not present

## 2017-06-25 DIAGNOSIS — H4922 Sixth [abducent] nerve palsy, left eye: Secondary | ICD-10-CM | POA: Diagnosis not present

## 2017-06-25 DIAGNOSIS — D3132 Benign neoplasm of left choroid: Secondary | ICD-10-CM | POA: Diagnosis not present

## 2017-06-26 DIAGNOSIS — M8580 Other specified disorders of bone density and structure, unspecified site: Secondary | ICD-10-CM | POA: Diagnosis not present

## 2017-06-26 DIAGNOSIS — E559 Vitamin D deficiency, unspecified: Secondary | ICD-10-CM | POA: Diagnosis not present

## 2017-06-26 DIAGNOSIS — K219 Gastro-esophageal reflux disease without esophagitis: Secondary | ICD-10-CM | POA: Diagnosis not present

## 2017-06-26 DIAGNOSIS — Z0000 Encounter for general adult medical examination without abnormal findings: Secondary | ICD-10-CM | POA: Diagnosis not present

## 2017-06-26 DIAGNOSIS — I10 Essential (primary) hypertension: Secondary | ICD-10-CM | POA: Diagnosis not present

## 2017-06-26 DIAGNOSIS — N390 Urinary tract infection, site not specified: Secondary | ICD-10-CM | POA: Diagnosis not present

## 2017-06-26 DIAGNOSIS — Z7982 Long term (current) use of aspirin: Secondary | ICD-10-CM | POA: Diagnosis not present

## 2017-06-26 DIAGNOSIS — Z125 Encounter for screening for malignant neoplasm of prostate: Secondary | ICD-10-CM | POA: Diagnosis not present

## 2017-06-27 DIAGNOSIS — L821 Other seborrheic keratosis: Secondary | ICD-10-CM | POA: Diagnosis not present

## 2017-06-27 DIAGNOSIS — Z85828 Personal history of other malignant neoplasm of skin: Secondary | ICD-10-CM | POA: Diagnosis not present

## 2017-06-27 DIAGNOSIS — D1801 Hemangioma of skin and subcutaneous tissue: Secondary | ICD-10-CM | POA: Diagnosis not present

## 2017-06-27 DIAGNOSIS — X32XXXA Exposure to sunlight, initial encounter: Secondary | ICD-10-CM | POA: Diagnosis not present

## 2017-06-27 DIAGNOSIS — L570 Actinic keratosis: Secondary | ICD-10-CM | POA: Diagnosis not present

## 2017-06-27 DIAGNOSIS — L308 Other specified dermatitis: Secondary | ICD-10-CM | POA: Diagnosis not present

## 2017-07-01 DIAGNOSIS — N200 Calculus of kidney: Secondary | ICD-10-CM | POA: Diagnosis not present

## 2017-07-01 DIAGNOSIS — Z6829 Body mass index (BMI) 29.0-29.9, adult: Secondary | ICD-10-CM | POA: Diagnosis not present

## 2017-07-01 DIAGNOSIS — M792 Neuralgia and neuritis, unspecified: Secondary | ICD-10-CM | POA: Diagnosis not present

## 2017-07-01 DIAGNOSIS — R079 Chest pain, unspecified: Secondary | ICD-10-CM | POA: Diagnosis not present

## 2017-07-01 DIAGNOSIS — K219 Gastro-esophageal reflux disease without esophagitis: Secondary | ICD-10-CM | POA: Diagnosis not present

## 2017-07-01 DIAGNOSIS — N401 Enlarged prostate with lower urinary tract symptoms: Secondary | ICD-10-CM | POA: Diagnosis not present

## 2017-07-01 DIAGNOSIS — J45909 Unspecified asthma, uncomplicated: Secondary | ICD-10-CM | POA: Diagnosis not present

## 2017-07-01 DIAGNOSIS — Z7982 Long term (current) use of aspirin: Secondary | ICD-10-CM | POA: Diagnosis not present

## 2017-07-01 DIAGNOSIS — I10 Essential (primary) hypertension: Secondary | ICD-10-CM | POA: Diagnosis not present

## 2017-07-01 DIAGNOSIS — R350 Frequency of micturition: Secondary | ICD-10-CM | POA: Diagnosis not present

## 2017-07-01 DIAGNOSIS — M8580 Other specified disorders of bone density and structure, unspecified site: Secondary | ICD-10-CM | POA: Diagnosis not present

## 2017-07-04 DIAGNOSIS — M533 Sacrococcygeal disorders, not elsewhere classified: Secondary | ICD-10-CM | POA: Diagnosis not present

## 2017-07-24 DIAGNOSIS — M545 Low back pain: Secondary | ICD-10-CM | POA: Diagnosis not present

## 2017-07-24 DIAGNOSIS — M542 Cervicalgia: Secondary | ICD-10-CM | POA: Diagnosis not present

## 2017-07-24 DIAGNOSIS — S233XXD Sprain of ligaments of thoracic spine, subsequent encounter: Secondary | ICD-10-CM | POA: Diagnosis not present

## 2017-07-29 DIAGNOSIS — M545 Low back pain: Secondary | ICD-10-CM | POA: Diagnosis not present

## 2017-07-29 DIAGNOSIS — M542 Cervicalgia: Secondary | ICD-10-CM | POA: Diagnosis not present

## 2017-07-29 DIAGNOSIS — S233XXD Sprain of ligaments of thoracic spine, subsequent encounter: Secondary | ICD-10-CM | POA: Diagnosis not present

## 2017-08-02 DIAGNOSIS — M542 Cervicalgia: Secondary | ICD-10-CM | POA: Diagnosis not present

## 2017-08-02 DIAGNOSIS — S233XXD Sprain of ligaments of thoracic spine, subsequent encounter: Secondary | ICD-10-CM | POA: Diagnosis not present

## 2017-08-02 DIAGNOSIS — M545 Low back pain: Secondary | ICD-10-CM | POA: Diagnosis not present

## 2017-08-15 DIAGNOSIS — S233XXD Sprain of ligaments of thoracic spine, subsequent encounter: Secondary | ICD-10-CM | POA: Diagnosis not present

## 2017-08-15 DIAGNOSIS — M542 Cervicalgia: Secondary | ICD-10-CM | POA: Diagnosis not present

## 2017-08-15 DIAGNOSIS — M545 Low back pain: Secondary | ICD-10-CM | POA: Diagnosis not present

## 2017-08-26 DIAGNOSIS — E0590 Thyrotoxicosis, unspecified without thyrotoxic crisis or storm: Secondary | ICD-10-CM | POA: Diagnosis not present

## 2017-09-03 DIAGNOSIS — M545 Low back pain: Secondary | ICD-10-CM | POA: Diagnosis not present

## 2017-09-03 DIAGNOSIS — M542 Cervicalgia: Secondary | ICD-10-CM | POA: Diagnosis not present

## 2017-09-03 DIAGNOSIS — S233XXD Sprain of ligaments of thoracic spine, subsequent encounter: Secondary | ICD-10-CM | POA: Diagnosis not present

## 2017-09-09 DIAGNOSIS — M545 Low back pain: Secondary | ICD-10-CM | POA: Diagnosis not present

## 2017-09-09 DIAGNOSIS — S233XXD Sprain of ligaments of thoracic spine, subsequent encounter: Secondary | ICD-10-CM | POA: Diagnosis not present

## 2017-09-09 DIAGNOSIS — M542 Cervicalgia: Secondary | ICD-10-CM | POA: Diagnosis not present

## 2017-10-01 DIAGNOSIS — M542 Cervicalgia: Secondary | ICD-10-CM | POA: Diagnosis not present

## 2017-10-01 DIAGNOSIS — M545 Low back pain: Secondary | ICD-10-CM | POA: Diagnosis not present

## 2017-10-01 DIAGNOSIS — S233XXD Sprain of ligaments of thoracic spine, subsequent encounter: Secondary | ICD-10-CM | POA: Diagnosis not present

## 2017-10-14 DIAGNOSIS — S233XXD Sprain of ligaments of thoracic spine, subsequent encounter: Secondary | ICD-10-CM | POA: Diagnosis not present

## 2017-10-14 DIAGNOSIS — M542 Cervicalgia: Secondary | ICD-10-CM | POA: Diagnosis not present

## 2017-10-14 DIAGNOSIS — M545 Low back pain: Secondary | ICD-10-CM | POA: Diagnosis not present

## 2017-11-11 DIAGNOSIS — M545 Low back pain: Secondary | ICD-10-CM | POA: Diagnosis not present

## 2017-11-11 DIAGNOSIS — S233XXD Sprain of ligaments of thoracic spine, subsequent encounter: Secondary | ICD-10-CM | POA: Diagnosis not present

## 2017-11-11 DIAGNOSIS — M542 Cervicalgia: Secondary | ICD-10-CM | POA: Diagnosis not present

## 2017-11-18 DIAGNOSIS — S233XXD Sprain of ligaments of thoracic spine, subsequent encounter: Secondary | ICD-10-CM | POA: Diagnosis not present

## 2017-11-18 DIAGNOSIS — M542 Cervicalgia: Secondary | ICD-10-CM | POA: Diagnosis not present

## 2017-11-18 DIAGNOSIS — M545 Low back pain: Secondary | ICD-10-CM | POA: Diagnosis not present

## 2017-11-22 DIAGNOSIS — M542 Cervicalgia: Secondary | ICD-10-CM | POA: Diagnosis not present

## 2017-11-22 DIAGNOSIS — M545 Low back pain: Secondary | ICD-10-CM | POA: Diagnosis not present

## 2017-11-22 DIAGNOSIS — S233XXD Sprain of ligaments of thoracic spine, subsequent encounter: Secondary | ICD-10-CM | POA: Diagnosis not present

## 2017-12-02 DIAGNOSIS — R972 Elevated prostate specific antigen [PSA]: Secondary | ICD-10-CM | POA: Diagnosis not present

## 2017-12-03 DIAGNOSIS — M542 Cervicalgia: Secondary | ICD-10-CM | POA: Diagnosis not present

## 2017-12-03 DIAGNOSIS — M545 Low back pain: Secondary | ICD-10-CM | POA: Diagnosis not present

## 2017-12-03 DIAGNOSIS — S233XXD Sprain of ligaments of thoracic spine, subsequent encounter: Secondary | ICD-10-CM | POA: Diagnosis not present

## 2017-12-11 DIAGNOSIS — N401 Enlarged prostate with lower urinary tract symptoms: Secondary | ICD-10-CM | POA: Diagnosis not present

## 2017-12-11 DIAGNOSIS — R3912 Poor urinary stream: Secondary | ICD-10-CM | POA: Diagnosis not present

## 2017-12-11 DIAGNOSIS — N200 Calculus of kidney: Secondary | ICD-10-CM | POA: Diagnosis not present

## 2017-12-11 DIAGNOSIS — R972 Elevated prostate specific antigen [PSA]: Secondary | ICD-10-CM | POA: Diagnosis not present

## 2017-12-11 DIAGNOSIS — N3941 Urge incontinence: Secondary | ICD-10-CM | POA: Diagnosis not present

## 2017-12-18 DIAGNOSIS — Z08 Encounter for follow-up examination after completed treatment for malignant neoplasm: Secondary | ICD-10-CM | POA: Diagnosis not present

## 2017-12-18 DIAGNOSIS — C44622 Squamous cell carcinoma of skin of right upper limb, including shoulder: Secondary | ICD-10-CM | POA: Diagnosis not present

## 2017-12-18 DIAGNOSIS — Z85828 Personal history of other malignant neoplasm of skin: Secondary | ICD-10-CM | POA: Diagnosis not present

## 2017-12-18 DIAGNOSIS — X32XXXA Exposure to sunlight, initial encounter: Secondary | ICD-10-CM | POA: Diagnosis not present

## 2017-12-18 DIAGNOSIS — C44629 Squamous cell carcinoma of skin of left upper limb, including shoulder: Secondary | ICD-10-CM | POA: Diagnosis not present

## 2017-12-18 DIAGNOSIS — D485 Neoplasm of uncertain behavior of skin: Secondary | ICD-10-CM | POA: Diagnosis not present

## 2017-12-18 DIAGNOSIS — L570 Actinic keratosis: Secondary | ICD-10-CM | POA: Diagnosis not present

## 2018-01-20 DIAGNOSIS — N401 Enlarged prostate with lower urinary tract symptoms: Secondary | ICD-10-CM | POA: Diagnosis not present

## 2018-01-20 DIAGNOSIS — R3912 Poor urinary stream: Secondary | ICD-10-CM | POA: Diagnosis not present

## 2018-01-30 DIAGNOSIS — C44622 Squamous cell carcinoma of skin of right upper limb, including shoulder: Secondary | ICD-10-CM | POA: Diagnosis not present

## 2018-01-30 DIAGNOSIS — C44629 Squamous cell carcinoma of skin of left upper limb, including shoulder: Secondary | ICD-10-CM | POA: Diagnosis not present

## 2018-02-13 DIAGNOSIS — S233XXD Sprain of ligaments of thoracic spine, subsequent encounter: Secondary | ICD-10-CM | POA: Diagnosis not present

## 2018-02-13 DIAGNOSIS — M542 Cervicalgia: Secondary | ICD-10-CM | POA: Diagnosis not present

## 2018-02-13 DIAGNOSIS — M545 Low back pain: Secondary | ICD-10-CM | POA: Diagnosis not present

## 2018-02-17 DIAGNOSIS — M545 Low back pain: Secondary | ICD-10-CM | POA: Diagnosis not present

## 2018-02-17 DIAGNOSIS — S233XXD Sprain of ligaments of thoracic spine, subsequent encounter: Secondary | ICD-10-CM | POA: Diagnosis not present

## 2018-02-17 DIAGNOSIS — M542 Cervicalgia: Secondary | ICD-10-CM | POA: Diagnosis not present

## 2018-02-20 DIAGNOSIS — M542 Cervicalgia: Secondary | ICD-10-CM | POA: Diagnosis not present

## 2018-02-20 DIAGNOSIS — M545 Low back pain: Secondary | ICD-10-CM | POA: Diagnosis not present

## 2018-02-20 DIAGNOSIS — S233XXD Sprain of ligaments of thoracic spine, subsequent encounter: Secondary | ICD-10-CM | POA: Diagnosis not present

## 2018-02-24 DIAGNOSIS — M542 Cervicalgia: Secondary | ICD-10-CM | POA: Diagnosis not present

## 2018-02-24 DIAGNOSIS — M545 Low back pain: Secondary | ICD-10-CM | POA: Diagnosis not present

## 2018-02-24 DIAGNOSIS — S233XXD Sprain of ligaments of thoracic spine, subsequent encounter: Secondary | ICD-10-CM | POA: Diagnosis not present

## 2018-02-26 DIAGNOSIS — I10 Essential (primary) hypertension: Secondary | ICD-10-CM | POA: Diagnosis not present

## 2018-02-26 DIAGNOSIS — E0590 Thyrotoxicosis, unspecified without thyrotoxic crisis or storm: Secondary | ICD-10-CM | POA: Diagnosis not present

## 2018-02-26 DIAGNOSIS — Z7982 Long term (current) use of aspirin: Secondary | ICD-10-CM | POA: Diagnosis not present

## 2018-03-04 DIAGNOSIS — I10 Essential (primary) hypertension: Secondary | ICD-10-CM | POA: Diagnosis not present

## 2018-03-04 DIAGNOSIS — Z23 Encounter for immunization: Secondary | ICD-10-CM | POA: Diagnosis not present

## 2018-03-04 DIAGNOSIS — E0590 Thyrotoxicosis, unspecified without thyrotoxic crisis or storm: Secondary | ICD-10-CM | POA: Diagnosis not present

## 2018-03-05 DIAGNOSIS — M542 Cervicalgia: Secondary | ICD-10-CM | POA: Diagnosis not present

## 2018-03-05 DIAGNOSIS — S233XXD Sprain of ligaments of thoracic spine, subsequent encounter: Secondary | ICD-10-CM | POA: Diagnosis not present

## 2018-03-05 DIAGNOSIS — M545 Low back pain: Secondary | ICD-10-CM | POA: Diagnosis not present

## 2018-03-19 DIAGNOSIS — M542 Cervicalgia: Secondary | ICD-10-CM | POA: Diagnosis not present

## 2018-03-19 DIAGNOSIS — M545 Low back pain: Secondary | ICD-10-CM | POA: Diagnosis not present

## 2018-03-19 DIAGNOSIS — S233XXD Sprain of ligaments of thoracic spine, subsequent encounter: Secondary | ICD-10-CM | POA: Diagnosis not present

## 2018-04-09 DIAGNOSIS — M545 Low back pain: Secondary | ICD-10-CM | POA: Diagnosis not present

## 2018-04-09 DIAGNOSIS — S233XXD Sprain of ligaments of thoracic spine, subsequent encounter: Secondary | ICD-10-CM | POA: Diagnosis not present

## 2018-04-09 DIAGNOSIS — M542 Cervicalgia: Secondary | ICD-10-CM | POA: Diagnosis not present

## 2018-04-21 DIAGNOSIS — N3941 Urge incontinence: Secondary | ICD-10-CM | POA: Diagnosis not present

## 2018-04-21 DIAGNOSIS — N401 Enlarged prostate with lower urinary tract symptoms: Secondary | ICD-10-CM | POA: Diagnosis not present

## 2018-04-21 DIAGNOSIS — R3912 Poor urinary stream: Secondary | ICD-10-CM | POA: Diagnosis not present

## 2018-04-23 DIAGNOSIS — M545 Low back pain: Secondary | ICD-10-CM | POA: Diagnosis not present

## 2018-04-23 DIAGNOSIS — S233XXD Sprain of ligaments of thoracic spine, subsequent encounter: Secondary | ICD-10-CM | POA: Diagnosis not present

## 2018-04-23 DIAGNOSIS — M542 Cervicalgia: Secondary | ICD-10-CM | POA: Diagnosis not present

## 2018-04-28 DIAGNOSIS — M533 Sacrococcygeal disorders, not elsewhere classified: Secondary | ICD-10-CM | POA: Diagnosis not present

## 2018-04-28 DIAGNOSIS — M791 Myalgia, unspecified site: Secondary | ICD-10-CM | POA: Diagnosis not present

## 2018-05-05 DIAGNOSIS — E0590 Thyrotoxicosis, unspecified without thyrotoxic crisis or storm: Secondary | ICD-10-CM | POA: Diagnosis not present

## 2018-05-10 DIAGNOSIS — M542 Cervicalgia: Secondary | ICD-10-CM | POA: Diagnosis not present

## 2018-05-10 DIAGNOSIS — M546 Pain in thoracic spine: Secondary | ICD-10-CM | POA: Diagnosis not present

## 2018-05-16 DIAGNOSIS — M5414 Radiculopathy, thoracic region: Secondary | ICD-10-CM | POA: Diagnosis not present

## 2018-06-09 DIAGNOSIS — R351 Nocturia: Secondary | ICD-10-CM | POA: Diagnosis not present

## 2018-06-09 DIAGNOSIS — N401 Enlarged prostate with lower urinary tract symptoms: Secondary | ICD-10-CM | POA: Diagnosis not present

## 2018-06-19 DIAGNOSIS — Z08 Encounter for follow-up examination after completed treatment for malignant neoplasm: Secondary | ICD-10-CM | POA: Diagnosis not present

## 2018-06-19 DIAGNOSIS — Z85828 Personal history of other malignant neoplasm of skin: Secondary | ICD-10-CM | POA: Diagnosis not present

## 2018-06-19 DIAGNOSIS — L728 Other follicular cysts of the skin and subcutaneous tissue: Secondary | ICD-10-CM | POA: Diagnosis not present

## 2018-06-19 DIAGNOSIS — D485 Neoplasm of uncertain behavior of skin: Secondary | ICD-10-CM | POA: Diagnosis not present

## 2018-06-19 DIAGNOSIS — D044 Carcinoma in situ of skin of scalp and neck: Secondary | ICD-10-CM | POA: Diagnosis not present

## 2018-06-19 DIAGNOSIS — L821 Other seborrheic keratosis: Secondary | ICD-10-CM | POA: Diagnosis not present

## 2018-06-19 DIAGNOSIS — X32XXXA Exposure to sunlight, initial encounter: Secondary | ICD-10-CM | POA: Diagnosis not present

## 2018-06-19 DIAGNOSIS — L570 Actinic keratosis: Secondary | ICD-10-CM | POA: Diagnosis not present

## 2018-06-19 DIAGNOSIS — L853 Xerosis cutis: Secondary | ICD-10-CM | POA: Diagnosis not present

## 2018-06-25 DIAGNOSIS — N401 Enlarged prostate with lower urinary tract symptoms: Secondary | ICD-10-CM | POA: Diagnosis not present

## 2018-06-25 DIAGNOSIS — R3912 Poor urinary stream: Secondary | ICD-10-CM | POA: Diagnosis not present

## 2018-06-26 DIAGNOSIS — S233XXD Sprain of ligaments of thoracic spine, subsequent encounter: Secondary | ICD-10-CM | POA: Diagnosis not present

## 2018-06-26 DIAGNOSIS — M545 Low back pain: Secondary | ICD-10-CM | POA: Diagnosis not present

## 2018-06-26 DIAGNOSIS — M542 Cervicalgia: Secondary | ICD-10-CM | POA: Diagnosis not present

## 2018-06-27 DIAGNOSIS — M545 Low back pain: Secondary | ICD-10-CM | POA: Diagnosis not present

## 2018-06-27 DIAGNOSIS — M542 Cervicalgia: Secondary | ICD-10-CM | POA: Diagnosis not present

## 2018-06-27 DIAGNOSIS — S233XXD Sprain of ligaments of thoracic spine, subsequent encounter: Secondary | ICD-10-CM | POA: Diagnosis not present

## 2018-07-01 DIAGNOSIS — M542 Cervicalgia: Secondary | ICD-10-CM | POA: Diagnosis not present

## 2018-07-01 DIAGNOSIS — M545 Low back pain: Secondary | ICD-10-CM | POA: Diagnosis not present

## 2018-07-01 DIAGNOSIS — S233XXD Sprain of ligaments of thoracic spine, subsequent encounter: Secondary | ICD-10-CM | POA: Diagnosis not present

## 2018-07-03 DIAGNOSIS — I10 Essential (primary) hypertension: Secondary | ICD-10-CM | POA: Diagnosis not present

## 2018-07-04 DIAGNOSIS — S233XXD Sprain of ligaments of thoracic spine, subsequent encounter: Secondary | ICD-10-CM | POA: Diagnosis not present

## 2018-07-04 DIAGNOSIS — M542 Cervicalgia: Secondary | ICD-10-CM | POA: Diagnosis not present

## 2018-07-04 DIAGNOSIS — M545 Low back pain: Secondary | ICD-10-CM | POA: Diagnosis not present

## 2018-07-07 DIAGNOSIS — Z7982 Long term (current) use of aspirin: Secondary | ICD-10-CM | POA: Diagnosis not present

## 2018-07-07 DIAGNOSIS — S233XXD Sprain of ligaments of thoracic spine, subsequent encounter: Secondary | ICD-10-CM | POA: Diagnosis not present

## 2018-07-07 DIAGNOSIS — N401 Enlarged prostate with lower urinary tract symptoms: Secondary | ICD-10-CM | POA: Diagnosis not present

## 2018-07-07 DIAGNOSIS — E0590 Thyrotoxicosis, unspecified without thyrotoxic crisis or storm: Secondary | ICD-10-CM | POA: Diagnosis not present

## 2018-07-07 DIAGNOSIS — I10 Essential (primary) hypertension: Secondary | ICD-10-CM | POA: Diagnosis not present

## 2018-07-07 DIAGNOSIS — J45909 Unspecified asthma, uncomplicated: Secondary | ICD-10-CM | POA: Diagnosis not present

## 2018-07-07 DIAGNOSIS — R079 Chest pain, unspecified: Secondary | ICD-10-CM | POA: Diagnosis not present

## 2018-07-07 DIAGNOSIS — M545 Low back pain: Secondary | ICD-10-CM | POA: Diagnosis not present

## 2018-07-07 DIAGNOSIS — K219 Gastro-esophageal reflux disease without esophagitis: Secondary | ICD-10-CM | POA: Diagnosis not present

## 2018-07-07 DIAGNOSIS — Z0000 Encounter for general adult medical examination without abnormal findings: Secondary | ICD-10-CM | POA: Diagnosis not present

## 2018-07-07 DIAGNOSIS — M542 Cervicalgia: Secondary | ICD-10-CM | POA: Diagnosis not present

## 2018-07-10 DIAGNOSIS — M545 Low back pain: Secondary | ICD-10-CM | POA: Diagnosis not present

## 2018-07-10 DIAGNOSIS — S233XXD Sprain of ligaments of thoracic spine, subsequent encounter: Secondary | ICD-10-CM | POA: Diagnosis not present

## 2018-07-10 DIAGNOSIS — M542 Cervicalgia: Secondary | ICD-10-CM | POA: Diagnosis not present

## 2018-07-17 DIAGNOSIS — M5416 Radiculopathy, lumbar region: Secondary | ICD-10-CM | POA: Diagnosis not present

## 2018-07-24 DIAGNOSIS — M5416 Radiculopathy, lumbar region: Secondary | ICD-10-CM | POA: Diagnosis not present

## 2018-07-31 DIAGNOSIS — D044 Carcinoma in situ of skin of scalp and neck: Secondary | ICD-10-CM | POA: Diagnosis not present

## 2018-08-07 DIAGNOSIS — M5416 Radiculopathy, lumbar region: Secondary | ICD-10-CM | POA: Diagnosis not present

## 2018-08-12 DIAGNOSIS — M5416 Radiculopathy, lumbar region: Secondary | ICD-10-CM | POA: Diagnosis not present

## 2018-08-18 DIAGNOSIS — H5000 Unspecified esotropia: Secondary | ICD-10-CM | POA: Diagnosis not present

## 2018-08-18 DIAGNOSIS — H40033 Anatomical narrow angle, bilateral: Secondary | ICD-10-CM | POA: Diagnosis not present

## 2018-08-18 DIAGNOSIS — D3132 Benign neoplasm of left choroid: Secondary | ICD-10-CM | POA: Diagnosis not present

## 2018-08-18 DIAGNOSIS — H2513 Age-related nuclear cataract, bilateral: Secondary | ICD-10-CM | POA: Diagnosis not present

## 2018-10-06 DIAGNOSIS — R29898 Other symptoms and signs involving the musculoskeletal system: Secondary | ICD-10-CM | POA: Diagnosis not present

## 2018-10-09 DIAGNOSIS — M5416 Radiculopathy, lumbar region: Secondary | ICD-10-CM | POA: Diagnosis not present

## 2018-10-13 DIAGNOSIS — M5416 Radiculopathy, lumbar region: Secondary | ICD-10-CM | POA: Diagnosis not present

## 2018-10-14 DIAGNOSIS — M5416 Radiculopathy, lumbar region: Secondary | ICD-10-CM | POA: Diagnosis not present

## 2018-10-16 DIAGNOSIS — M5416 Radiculopathy, lumbar region: Secondary | ICD-10-CM | POA: Diagnosis not present

## 2018-10-23 DIAGNOSIS — M5416 Radiculopathy, lumbar region: Secondary | ICD-10-CM | POA: Diagnosis not present

## 2018-11-05 DIAGNOSIS — M5416 Radiculopathy, lumbar region: Secondary | ICD-10-CM | POA: Diagnosis not present

## 2018-11-17 DIAGNOSIS — M5416 Radiculopathy, lumbar region: Secondary | ICD-10-CM | POA: Diagnosis not present

## 2018-11-24 DIAGNOSIS — M5416 Radiculopathy, lumbar region: Secondary | ICD-10-CM | POA: Diagnosis not present

## 2018-12-08 DIAGNOSIS — N401 Enlarged prostate with lower urinary tract symptoms: Secondary | ICD-10-CM | POA: Diagnosis not present

## 2018-12-08 DIAGNOSIS — M5416 Radiculopathy, lumbar region: Secondary | ICD-10-CM | POA: Diagnosis not present

## 2018-12-08 DIAGNOSIS — R351 Nocturia: Secondary | ICD-10-CM | POA: Diagnosis not present

## 2018-12-08 DIAGNOSIS — N3941 Urge incontinence: Secondary | ICD-10-CM | POA: Diagnosis not present

## 2018-12-15 DIAGNOSIS — M5416 Radiculopathy, lumbar region: Secondary | ICD-10-CM | POA: Diagnosis not present

## 2018-12-18 DIAGNOSIS — Z85828 Personal history of other malignant neoplasm of skin: Secondary | ICD-10-CM | POA: Diagnosis not present

## 2018-12-18 DIAGNOSIS — L570 Actinic keratosis: Secondary | ICD-10-CM | POA: Diagnosis not present

## 2018-12-18 DIAGNOSIS — L820 Inflamed seborrheic keratosis: Secondary | ICD-10-CM | POA: Diagnosis not present

## 2018-12-18 DIAGNOSIS — D485 Neoplasm of uncertain behavior of skin: Secondary | ICD-10-CM | POA: Diagnosis not present

## 2018-12-18 DIAGNOSIS — C44729 Squamous cell carcinoma of skin of left lower limb, including hip: Secondary | ICD-10-CM | POA: Diagnosis not present

## 2018-12-18 DIAGNOSIS — X32XXXA Exposure to sunlight, initial encounter: Secondary | ICD-10-CM | POA: Diagnosis not present

## 2018-12-18 DIAGNOSIS — Z08 Encounter for follow-up examination after completed treatment for malignant neoplasm: Secondary | ICD-10-CM | POA: Diagnosis not present

## 2018-12-18 DIAGNOSIS — R208 Other disturbances of skin sensation: Secondary | ICD-10-CM | POA: Diagnosis not present

## 2018-12-29 DIAGNOSIS — M5416 Radiculopathy, lumbar region: Secondary | ICD-10-CM | POA: Diagnosis not present

## 2019-01-01 DIAGNOSIS — L538 Other specified erythematous conditions: Secondary | ICD-10-CM | POA: Diagnosis not present

## 2019-01-01 DIAGNOSIS — R208 Other disturbances of skin sensation: Secondary | ICD-10-CM | POA: Diagnosis not present

## 2019-01-01 DIAGNOSIS — L720 Epidermal cyst: Secondary | ICD-10-CM | POA: Diagnosis not present

## 2019-01-01 DIAGNOSIS — L728 Other follicular cysts of the skin and subcutaneous tissue: Secondary | ICD-10-CM | POA: Diagnosis not present

## 2019-01-29 DIAGNOSIS — M5416 Radiculopathy, lumbar region: Secondary | ICD-10-CM | POA: Diagnosis not present

## 2019-03-03 DIAGNOSIS — E0590 Thyrotoxicosis, unspecified without thyrotoxic crisis or storm: Secondary | ICD-10-CM | POA: Diagnosis not present

## 2019-03-03 DIAGNOSIS — M8589 Other specified disorders of bone density and structure, multiple sites: Secondary | ICD-10-CM | POA: Diagnosis not present

## 2019-03-03 DIAGNOSIS — Z7982 Long term (current) use of aspirin: Secondary | ICD-10-CM | POA: Diagnosis not present

## 2019-03-03 DIAGNOSIS — I10 Essential (primary) hypertension: Secondary | ICD-10-CM | POA: Diagnosis not present

## 2019-03-12 DIAGNOSIS — I10 Essential (primary) hypertension: Secondary | ICD-10-CM | POA: Diagnosis not present

## 2019-03-12 DIAGNOSIS — Z7982 Long term (current) use of aspirin: Secondary | ICD-10-CM | POA: Diagnosis not present

## 2019-03-12 DIAGNOSIS — E0590 Thyrotoxicosis, unspecified without thyrotoxic crisis or storm: Secondary | ICD-10-CM | POA: Diagnosis not present

## 2019-03-18 ENCOUNTER — Other Ambulatory Visit: Payer: Self-pay

## 2019-03-18 DIAGNOSIS — Z20828 Contact with and (suspected) exposure to other viral communicable diseases: Secondary | ICD-10-CM | POA: Diagnosis not present

## 2019-03-18 DIAGNOSIS — Z20822 Contact with and (suspected) exposure to covid-19: Secondary | ICD-10-CM

## 2019-03-19 DIAGNOSIS — M810 Age-related osteoporosis without current pathological fracture: Secondary | ICD-10-CM | POA: Diagnosis not present

## 2019-03-20 LAB — NOVEL CORONAVIRUS, NAA: SARS-CoV-2, NAA: NOT DETECTED

## 2019-03-25 DIAGNOSIS — X32XXXA Exposure to sunlight, initial encounter: Secondary | ICD-10-CM | POA: Diagnosis not present

## 2019-03-25 DIAGNOSIS — L570 Actinic keratosis: Secondary | ICD-10-CM | POA: Diagnosis not present

## 2019-04-02 DIAGNOSIS — Z23 Encounter for immunization: Secondary | ICD-10-CM | POA: Diagnosis not present

## 2019-04-29 DIAGNOSIS — M25561 Pain in right knee: Secondary | ICD-10-CM | POA: Diagnosis not present

## 2019-04-29 DIAGNOSIS — M7051 Other bursitis of knee, right knee: Secondary | ICD-10-CM | POA: Diagnosis not present

## 2019-05-05 DIAGNOSIS — R0989 Other specified symptoms and signs involving the circulatory and respiratory systems: Secondary | ICD-10-CM | POA: Diagnosis not present

## 2019-05-14 DIAGNOSIS — Z7982 Long term (current) use of aspirin: Secondary | ICD-10-CM | POA: Diagnosis not present

## 2019-05-14 DIAGNOSIS — E0590 Thyrotoxicosis, unspecified without thyrotoxic crisis or storm: Secondary | ICD-10-CM | POA: Diagnosis not present

## 2019-05-14 DIAGNOSIS — I10 Essential (primary) hypertension: Secondary | ICD-10-CM | POA: Diagnosis not present

## 2019-06-24 DIAGNOSIS — C44622 Squamous cell carcinoma of skin of right upper limb, including shoulder: Secondary | ICD-10-CM | POA: Diagnosis not present

## 2019-07-06 DIAGNOSIS — M5416 Radiculopathy, lumbar region: Secondary | ICD-10-CM | POA: Diagnosis not present

## 2019-07-08 DIAGNOSIS — Z7982 Long term (current) use of aspirin: Secondary | ICD-10-CM | POA: Diagnosis not present

## 2019-07-08 DIAGNOSIS — I10 Essential (primary) hypertension: Secondary | ICD-10-CM | POA: Diagnosis not present

## 2019-07-13 DIAGNOSIS — N401 Enlarged prostate with lower urinary tract symptoms: Secondary | ICD-10-CM | POA: Diagnosis not present

## 2019-07-13 DIAGNOSIS — I10 Essential (primary) hypertension: Secondary | ICD-10-CM | POA: Diagnosis not present

## 2019-07-13 DIAGNOSIS — N200 Calculus of kidney: Secondary | ICD-10-CM | POA: Diagnosis not present

## 2019-07-13 DIAGNOSIS — M8580 Other specified disorders of bone density and structure, unspecified site: Secondary | ICD-10-CM | POA: Diagnosis not present

## 2019-07-13 DIAGNOSIS — Z7982 Long term (current) use of aspirin: Secondary | ICD-10-CM | POA: Diagnosis not present

## 2019-07-13 DIAGNOSIS — E0590 Thyrotoxicosis, unspecified without thyrotoxic crisis or storm: Secondary | ICD-10-CM | POA: Diagnosis not present

## 2019-07-13 DIAGNOSIS — Z87891 Personal history of nicotine dependence: Secondary | ICD-10-CM | POA: Diagnosis not present

## 2019-07-13 DIAGNOSIS — Z0000 Encounter for general adult medical examination without abnormal findings: Secondary | ICD-10-CM | POA: Diagnosis not present

## 2019-07-13 DIAGNOSIS — E559 Vitamin D deficiency, unspecified: Secondary | ICD-10-CM | POA: Diagnosis not present

## 2019-07-13 DIAGNOSIS — K219 Gastro-esophageal reflux disease without esophagitis: Secondary | ICD-10-CM | POA: Diagnosis not present

## 2019-07-13 DIAGNOSIS — J45909 Unspecified asthma, uncomplicated: Secondary | ICD-10-CM | POA: Diagnosis not present

## 2019-07-16 DIAGNOSIS — C44622 Squamous cell carcinoma of skin of right upper limb, including shoulder: Secondary | ICD-10-CM | POA: Diagnosis not present

## 2019-07-16 DIAGNOSIS — D0461 Carcinoma in situ of skin of right upper limb, including shoulder: Secondary | ICD-10-CM | POA: Diagnosis not present

## 2019-08-10 DIAGNOSIS — Z9889 Other specified postprocedural states: Secondary | ICD-10-CM | POA: Diagnosis not present

## 2019-08-10 DIAGNOSIS — H2513 Age-related nuclear cataract, bilateral: Secondary | ICD-10-CM | POA: Diagnosis not present

## 2019-08-10 DIAGNOSIS — D3132 Benign neoplasm of left choroid: Secondary | ICD-10-CM | POA: Diagnosis not present

## 2019-08-10 DIAGNOSIS — H40033 Anatomical narrow angle, bilateral: Secondary | ICD-10-CM | POA: Diagnosis not present

## 2019-08-10 DIAGNOSIS — H5000 Unspecified esotropia: Secondary | ICD-10-CM | POA: Diagnosis not present

## 2019-08-12 DIAGNOSIS — D485 Neoplasm of uncertain behavior of skin: Secondary | ICD-10-CM | POA: Diagnosis not present

## 2019-08-12 DIAGNOSIS — L729 Follicular cyst of the skin and subcutaneous tissue, unspecified: Secondary | ICD-10-CM | POA: Diagnosis not present

## 2019-08-13 DIAGNOSIS — M5416 Radiculopathy, lumbar region: Secondary | ICD-10-CM | POA: Diagnosis not present

## 2019-08-18 DIAGNOSIS — Z7982 Long term (current) use of aspirin: Secondary | ICD-10-CM | POA: Diagnosis not present

## 2019-08-18 DIAGNOSIS — I10 Essential (primary) hypertension: Secondary | ICD-10-CM | POA: Diagnosis not present

## 2019-08-18 DIAGNOSIS — E0590 Thyrotoxicosis, unspecified without thyrotoxic crisis or storm: Secondary | ICD-10-CM | POA: Diagnosis not present

## 2019-08-20 DIAGNOSIS — H2511 Age-related nuclear cataract, right eye: Secondary | ICD-10-CM | POA: Diagnosis not present

## 2019-08-27 DIAGNOSIS — L570 Actinic keratosis: Secondary | ICD-10-CM | POA: Diagnosis not present

## 2019-08-27 DIAGNOSIS — X32XXXA Exposure to sunlight, initial encounter: Secondary | ICD-10-CM | POA: Diagnosis not present

## 2019-08-27 DIAGNOSIS — D485 Neoplasm of uncertain behavior of skin: Secondary | ICD-10-CM | POA: Diagnosis not present

## 2019-08-27 DIAGNOSIS — C44622 Squamous cell carcinoma of skin of right upper limb, including shoulder: Secondary | ICD-10-CM | POA: Diagnosis not present

## 2019-08-30 DIAGNOSIS — H2512 Age-related nuclear cataract, left eye: Secondary | ICD-10-CM | POA: Diagnosis not present

## 2019-09-02 DIAGNOSIS — M5416 Radiculopathy, lumbar region: Secondary | ICD-10-CM | POA: Diagnosis not present

## 2019-09-03 DIAGNOSIS — H2512 Age-related nuclear cataract, left eye: Secondary | ICD-10-CM | POA: Diagnosis not present

## 2019-09-10 DIAGNOSIS — Z7982 Long term (current) use of aspirin: Secondary | ICD-10-CM | POA: Diagnosis not present

## 2019-09-10 DIAGNOSIS — I10 Essential (primary) hypertension: Secondary | ICD-10-CM | POA: Diagnosis not present

## 2019-09-10 DIAGNOSIS — E0590 Thyrotoxicosis, unspecified without thyrotoxic crisis or storm: Secondary | ICD-10-CM | POA: Diagnosis not present

## 2019-09-16 DIAGNOSIS — C44622 Squamous cell carcinoma of skin of right upper limb, including shoulder: Secondary | ICD-10-CM | POA: Diagnosis not present

## 2019-09-17 DIAGNOSIS — M5416 Radiculopathy, lumbar region: Secondary | ICD-10-CM | POA: Diagnosis not present

## 2019-09-25 DIAGNOSIS — M5416 Radiculopathy, lumbar region: Secondary | ICD-10-CM | POA: Diagnosis not present

## 2019-10-08 DIAGNOSIS — C44229 Squamous cell carcinoma of skin of left ear and external auricular canal: Secondary | ICD-10-CM | POA: Diagnosis not present

## 2019-10-08 DIAGNOSIS — D485 Neoplasm of uncertain behavior of skin: Secondary | ICD-10-CM | POA: Diagnosis not present

## 2019-10-09 DIAGNOSIS — M5416 Radiculopathy, lumbar region: Secondary | ICD-10-CM | POA: Diagnosis not present

## 2019-10-30 DIAGNOSIS — C44329 Squamous cell carcinoma of skin of other parts of face: Secondary | ICD-10-CM | POA: Diagnosis not present

## 2019-11-03 DIAGNOSIS — M5416 Radiculopathy, lumbar region: Secondary | ICD-10-CM | POA: Diagnosis not present

## 2019-11-12 DIAGNOSIS — M5416 Radiculopathy, lumbar region: Secondary | ICD-10-CM | POA: Diagnosis not present

## 2019-11-19 DIAGNOSIS — D485 Neoplasm of uncertain behavior of skin: Secondary | ICD-10-CM | POA: Diagnosis not present

## 2019-11-19 DIAGNOSIS — L821 Other seborrheic keratosis: Secondary | ICD-10-CM | POA: Diagnosis not present

## 2019-11-19 DIAGNOSIS — L570 Actinic keratosis: Secondary | ICD-10-CM | POA: Diagnosis not present

## 2019-11-19 DIAGNOSIS — X32XXXA Exposure to sunlight, initial encounter: Secondary | ICD-10-CM | POA: Diagnosis not present

## 2019-11-19 DIAGNOSIS — D1801 Hemangioma of skin and subcutaneous tissue: Secondary | ICD-10-CM | POA: Diagnosis not present

## 2019-11-20 DIAGNOSIS — M5416 Radiculopathy, lumbar region: Secondary | ICD-10-CM | POA: Diagnosis not present

## 2020-01-18 DIAGNOSIS — M5416 Radiculopathy, lumbar region: Secondary | ICD-10-CM | POA: Diagnosis not present

## 2020-01-27 DIAGNOSIS — M5416 Radiculopathy, lumbar region: Secondary | ICD-10-CM | POA: Diagnosis not present

## 2020-03-01 DIAGNOSIS — L728 Other follicular cysts of the skin and subcutaneous tissue: Secondary | ICD-10-CM | POA: Diagnosis not present

## 2020-03-07 DIAGNOSIS — I10 Essential (primary) hypertension: Secondary | ICD-10-CM | POA: Diagnosis not present

## 2020-03-07 DIAGNOSIS — Z7982 Long term (current) use of aspirin: Secondary | ICD-10-CM | POA: Diagnosis not present

## 2020-03-07 DIAGNOSIS — L723 Sebaceous cyst: Secondary | ICD-10-CM | POA: Diagnosis not present

## 2020-03-07 DIAGNOSIS — Z23 Encounter for immunization: Secondary | ICD-10-CM | POA: Diagnosis not present

## 2020-03-07 DIAGNOSIS — L089 Local infection of the skin and subcutaneous tissue, unspecified: Secondary | ICD-10-CM | POA: Diagnosis not present

## 2020-03-07 DIAGNOSIS — E039 Hypothyroidism, unspecified: Secondary | ICD-10-CM | POA: Diagnosis not present

## 2020-03-11 DIAGNOSIS — Z23 Encounter for immunization: Secondary | ICD-10-CM | POA: Diagnosis not present

## 2020-03-11 DIAGNOSIS — I10 Essential (primary) hypertension: Secondary | ICD-10-CM | POA: Diagnosis not present

## 2020-03-11 DIAGNOSIS — Z7982 Long term (current) use of aspirin: Secondary | ICD-10-CM | POA: Diagnosis not present

## 2020-03-11 DIAGNOSIS — E0590 Thyrotoxicosis, unspecified without thyrotoxic crisis or storm: Secondary | ICD-10-CM | POA: Diagnosis not present

## 2020-03-23 DIAGNOSIS — M25551 Pain in right hip: Secondary | ICD-10-CM | POA: Diagnosis not present

## 2020-03-23 DIAGNOSIS — M5416 Radiculopathy, lumbar region: Secondary | ICD-10-CM | POA: Diagnosis not present

## 2020-04-11 ENCOUNTER — Other Ambulatory Visit: Payer: Self-pay

## 2020-04-11 DIAGNOSIS — D3132 Benign neoplasm of left choroid: Secondary | ICD-10-CM | POA: Diagnosis not present

## 2020-04-11 DIAGNOSIS — H40033 Anatomical narrow angle, bilateral: Secondary | ICD-10-CM | POA: Diagnosis not present

## 2020-04-11 DIAGNOSIS — Z961 Presence of intraocular lens: Secondary | ICD-10-CM | POA: Diagnosis not present

## 2020-04-11 DIAGNOSIS — L723 Sebaceous cyst: Secondary | ICD-10-CM | POA: Diagnosis not present

## 2020-04-11 DIAGNOSIS — L720 Epidermal cyst: Secondary | ICD-10-CM | POA: Diagnosis not present

## 2020-04-11 DIAGNOSIS — Z9889 Other specified postprocedural states: Secondary | ICD-10-CM | POA: Diagnosis not present

## 2020-04-26 DIAGNOSIS — M25551 Pain in right hip: Secondary | ICD-10-CM | POA: Diagnosis not present

## 2020-04-26 DIAGNOSIS — M5416 Radiculopathy, lumbar region: Secondary | ICD-10-CM | POA: Diagnosis not present

## 2020-04-28 DIAGNOSIS — L089 Local infection of the skin and subcutaneous tissue, unspecified: Secondary | ICD-10-CM | POA: Diagnosis not present

## 2020-05-12 DIAGNOSIS — C44722 Squamous cell carcinoma of skin of right lower limb, including hip: Secondary | ICD-10-CM | POA: Diagnosis not present

## 2020-05-12 DIAGNOSIS — C44622 Squamous cell carcinoma of skin of right upper limb, including shoulder: Secondary | ICD-10-CM | POA: Diagnosis not present

## 2020-05-12 DIAGNOSIS — L821 Other seborrheic keratosis: Secondary | ICD-10-CM | POA: Diagnosis not present

## 2020-05-12 DIAGNOSIS — D044 Carcinoma in situ of skin of scalp and neck: Secondary | ICD-10-CM | POA: Diagnosis not present

## 2020-05-12 DIAGNOSIS — Z08 Encounter for follow-up examination after completed treatment for malignant neoplasm: Secondary | ICD-10-CM | POA: Diagnosis not present

## 2020-05-12 DIAGNOSIS — L570 Actinic keratosis: Secondary | ICD-10-CM | POA: Diagnosis not present

## 2020-05-12 DIAGNOSIS — D485 Neoplasm of uncertain behavior of skin: Secondary | ICD-10-CM | POA: Diagnosis not present

## 2020-05-12 DIAGNOSIS — X32XXXA Exposure to sunlight, initial encounter: Secondary | ICD-10-CM | POA: Diagnosis not present

## 2020-05-12 DIAGNOSIS — L84 Corns and callosities: Secondary | ICD-10-CM | POA: Diagnosis not present

## 2020-05-12 DIAGNOSIS — Z85828 Personal history of other malignant neoplasm of skin: Secondary | ICD-10-CM | POA: Diagnosis not present

## 2020-05-16 DIAGNOSIS — Z7982 Long term (current) use of aspirin: Secondary | ICD-10-CM | POA: Diagnosis not present

## 2020-05-16 DIAGNOSIS — I10 Essential (primary) hypertension: Secondary | ICD-10-CM | POA: Diagnosis not present

## 2020-05-18 DIAGNOSIS — M5416 Radiculopathy, lumbar region: Secondary | ICD-10-CM | POA: Diagnosis not present

## 2020-05-18 DIAGNOSIS — M25551 Pain in right hip: Secondary | ICD-10-CM | POA: Diagnosis not present

## 2020-05-31 DIAGNOSIS — M5416 Radiculopathy, lumbar region: Secondary | ICD-10-CM | POA: Diagnosis not present

## 2020-05-31 DIAGNOSIS — M25551 Pain in right hip: Secondary | ICD-10-CM | POA: Diagnosis not present

## 2020-06-23 DIAGNOSIS — C4442 Squamous cell carcinoma of skin of scalp and neck: Secondary | ICD-10-CM | POA: Diagnosis not present

## 2020-06-23 DIAGNOSIS — L820 Inflamed seborrheic keratosis: Secondary | ICD-10-CM | POA: Diagnosis not present

## 2020-07-01 DIAGNOSIS — M5416 Radiculopathy, lumbar region: Secondary | ICD-10-CM | POA: Diagnosis not present

## 2020-07-01 DIAGNOSIS — M25551 Pain in right hip: Secondary | ICD-10-CM | POA: Diagnosis not present

## 2020-07-07 DIAGNOSIS — D2371 Other benign neoplasm of skin of right lower limb, including hip: Secondary | ICD-10-CM | POA: Diagnosis not present

## 2020-07-07 DIAGNOSIS — D044 Carcinoma in situ of skin of scalp and neck: Secondary | ICD-10-CM | POA: Diagnosis not present

## 2020-07-07 DIAGNOSIS — C44722 Squamous cell carcinoma of skin of right lower limb, including hip: Secondary | ICD-10-CM | POA: Diagnosis not present

## 2020-07-14 DIAGNOSIS — I10 Essential (primary) hypertension: Secondary | ICD-10-CM | POA: Diagnosis not present

## 2020-07-14 DIAGNOSIS — Z7982 Long term (current) use of aspirin: Secondary | ICD-10-CM | POA: Diagnosis not present

## 2020-07-21 DIAGNOSIS — Z0000 Encounter for general adult medical examination without abnormal findings: Secondary | ICD-10-CM | POA: Diagnosis not present

## 2020-07-21 DIAGNOSIS — N401 Enlarged prostate with lower urinary tract symptoms: Secondary | ICD-10-CM | POA: Diagnosis not present

## 2020-07-21 DIAGNOSIS — K219 Gastro-esophageal reflux disease without esophagitis: Secondary | ICD-10-CM | POA: Diagnosis not present

## 2020-07-21 DIAGNOSIS — M542 Cervicalgia: Secondary | ICD-10-CM | POA: Diagnosis not present

## 2020-07-21 DIAGNOSIS — S90412A Abrasion, left great toe, initial encounter: Secondary | ICD-10-CM | POA: Diagnosis not present

## 2020-07-21 DIAGNOSIS — I10 Essential (primary) hypertension: Secondary | ICD-10-CM | POA: Diagnosis not present

## 2020-07-21 DIAGNOSIS — E559 Vitamin D deficiency, unspecified: Secondary | ICD-10-CM | POA: Diagnosis not present

## 2020-07-21 DIAGNOSIS — J309 Allergic rhinitis, unspecified: Secondary | ICD-10-CM | POA: Diagnosis not present

## 2020-07-21 DIAGNOSIS — I499 Cardiac arrhythmia, unspecified: Secondary | ICD-10-CM | POA: Diagnosis not present

## 2020-07-21 DIAGNOSIS — M8580 Other specified disorders of bone density and structure, unspecified site: Secondary | ICD-10-CM | POA: Diagnosis not present

## 2020-07-21 DIAGNOSIS — B351 Tinea unguium: Secondary | ICD-10-CM | POA: Diagnosis not present

## 2020-07-25 ENCOUNTER — Other Ambulatory Visit: Payer: Self-pay | Admitting: Internal Medicine

## 2020-07-25 DIAGNOSIS — I10 Essential (primary) hypertension: Secondary | ICD-10-CM

## 2020-07-27 DIAGNOSIS — L570 Actinic keratosis: Secondary | ICD-10-CM | POA: Diagnosis not present

## 2020-07-27 DIAGNOSIS — X32XXXA Exposure to sunlight, initial encounter: Secondary | ICD-10-CM | POA: Diagnosis not present

## 2020-08-03 DIAGNOSIS — I499 Cardiac arrhythmia, unspecified: Secondary | ICD-10-CM | POA: Diagnosis not present

## 2020-08-04 DIAGNOSIS — M5416 Radiculopathy, lumbar region: Secondary | ICD-10-CM | POA: Diagnosis not present

## 2020-08-04 DIAGNOSIS — M25551 Pain in right hip: Secondary | ICD-10-CM | POA: Diagnosis not present

## 2020-08-04 DIAGNOSIS — I499 Cardiac arrhythmia, unspecified: Secondary | ICD-10-CM | POA: Diagnosis not present

## 2020-08-10 DIAGNOSIS — N3941 Urge incontinence: Secondary | ICD-10-CM | POA: Diagnosis not present

## 2020-08-10 DIAGNOSIS — R3912 Poor urinary stream: Secondary | ICD-10-CM | POA: Diagnosis not present

## 2020-08-10 DIAGNOSIS — N401 Enlarged prostate with lower urinary tract symptoms: Secondary | ICD-10-CM | POA: Diagnosis not present

## 2020-08-11 ENCOUNTER — Ambulatory Visit
Admission: RE | Admit: 2020-08-11 | Discharge: 2020-08-11 | Disposition: A | Discharge status: Home / Self Care | Payer: No Typology Code available for payment source | Source: Ambulatory Visit | Attending: Internal Medicine | Admitting: Internal Medicine

## 2020-08-11 DIAGNOSIS — R911 Solitary pulmonary nodule: Secondary | ICD-10-CM | POA: Diagnosis not present

## 2020-08-11 DIAGNOSIS — M5416 Radiculopathy, lumbar region: Secondary | ICD-10-CM | POA: Diagnosis not present

## 2020-08-11 DIAGNOSIS — I2510 Atherosclerotic heart disease of native coronary artery without angina pectoris: Secondary | ICD-10-CM | POA: Diagnosis not present

## 2020-08-11 DIAGNOSIS — M25551 Pain in right hip: Secondary | ICD-10-CM | POA: Diagnosis not present

## 2020-08-11 DIAGNOSIS — I10 Essential (primary) hypertension: Secondary | ICD-10-CM

## 2020-08-11 DIAGNOSIS — I700 Atherosclerosis of aorta: Secondary | ICD-10-CM | POA: Diagnosis not present

## 2020-08-18 DIAGNOSIS — M5416 Radiculopathy, lumbar region: Secondary | ICD-10-CM | POA: Diagnosis not present

## 2020-08-18 DIAGNOSIS — M25551 Pain in right hip: Secondary | ICD-10-CM | POA: Diagnosis not present

## 2020-08-24 DIAGNOSIS — M5416 Radiculopathy, lumbar region: Secondary | ICD-10-CM | POA: Diagnosis not present

## 2020-08-24 DIAGNOSIS — M25551 Pain in right hip: Secondary | ICD-10-CM | POA: Diagnosis not present

## 2020-08-29 DIAGNOSIS — D485 Neoplasm of uncertain behavior of skin: Secondary | ICD-10-CM | POA: Diagnosis not present

## 2020-08-29 DIAGNOSIS — D044 Carcinoma in situ of skin of scalp and neck: Secondary | ICD-10-CM | POA: Diagnosis not present

## 2020-08-29 DIAGNOSIS — L570 Actinic keratosis: Secondary | ICD-10-CM | POA: Diagnosis not present

## 2020-08-29 DIAGNOSIS — X32XXXA Exposure to sunlight, initial encounter: Secondary | ICD-10-CM | POA: Diagnosis not present

## 2020-09-01 DIAGNOSIS — R42 Dizziness and giddiness: Secondary | ICD-10-CM | POA: Diagnosis not present

## 2020-09-01 DIAGNOSIS — N3941 Urge incontinence: Secondary | ICD-10-CM | POA: Diagnosis not present

## 2020-09-01 DIAGNOSIS — N401 Enlarged prostate with lower urinary tract symptoms: Secondary | ICD-10-CM | POA: Diagnosis not present

## 2020-09-01 DIAGNOSIS — I10 Essential (primary) hypertension: Secondary | ICD-10-CM | POA: Diagnosis not present

## 2020-09-01 DIAGNOSIS — R3912 Poor urinary stream: Secondary | ICD-10-CM | POA: Diagnosis not present

## 2020-09-01 DIAGNOSIS — Z7982 Long term (current) use of aspirin: Secondary | ICD-10-CM | POA: Diagnosis not present

## 2020-09-08 DIAGNOSIS — M25551 Pain in right hip: Secondary | ICD-10-CM | POA: Diagnosis not present

## 2020-09-08 DIAGNOSIS — M5416 Radiculopathy, lumbar region: Secondary | ICD-10-CM | POA: Diagnosis not present

## 2020-09-12 ENCOUNTER — Telehealth: Payer: Self-pay

## 2020-09-12 NOTE — Telephone Encounter (Signed)
Faxed records to [address]

## 2020-09-13 DIAGNOSIS — I10 Essential (primary) hypertension: Secondary | ICD-10-CM | POA: Diagnosis not present

## 2020-09-13 DIAGNOSIS — E0590 Thyrotoxicosis, unspecified without thyrotoxic crisis or storm: Secondary | ICD-10-CM | POA: Diagnosis not present

## 2020-09-13 DIAGNOSIS — Z7982 Long term (current) use of aspirin: Secondary | ICD-10-CM | POA: Diagnosis not present

## 2020-09-22 DIAGNOSIS — R260 Ataxic gait: Secondary | ICD-10-CM | POA: Diagnosis not present

## 2020-09-26 DIAGNOSIS — R260 Ataxic gait: Secondary | ICD-10-CM | POA: Diagnosis not present

## 2020-09-27 DIAGNOSIS — M25551 Pain in right hip: Secondary | ICD-10-CM | POA: Diagnosis not present

## 2020-09-27 DIAGNOSIS — M5416 Radiculopathy, lumbar region: Secondary | ICD-10-CM | POA: Diagnosis not present

## 2020-10-03 DIAGNOSIS — R260 Ataxic gait: Secondary | ICD-10-CM | POA: Diagnosis not present

## 2020-10-06 DIAGNOSIS — R3912 Poor urinary stream: Secondary | ICD-10-CM | POA: Diagnosis not present

## 2020-10-06 DIAGNOSIS — N3941 Urge incontinence: Secondary | ICD-10-CM | POA: Diagnosis not present

## 2020-10-06 DIAGNOSIS — R351 Nocturia: Secondary | ICD-10-CM | POA: Diagnosis not present

## 2020-10-06 DIAGNOSIS — N401 Enlarged prostate with lower urinary tract symptoms: Secondary | ICD-10-CM | POA: Diagnosis not present

## 2020-10-06 DIAGNOSIS — R260 Ataxic gait: Secondary | ICD-10-CM | POA: Diagnosis not present

## 2020-10-11 DIAGNOSIS — R260 Ataxic gait: Secondary | ICD-10-CM | POA: Diagnosis not present

## 2020-10-13 DIAGNOSIS — R260 Ataxic gait: Secondary | ICD-10-CM | POA: Diagnosis not present

## 2020-10-13 DIAGNOSIS — D044 Carcinoma in situ of skin of scalp and neck: Secondary | ICD-10-CM | POA: Diagnosis not present

## 2020-10-13 DIAGNOSIS — C4442 Squamous cell carcinoma of skin of scalp and neck: Secondary | ICD-10-CM | POA: Diagnosis not present

## 2020-10-18 DIAGNOSIS — R260 Ataxic gait: Secondary | ICD-10-CM | POA: Diagnosis not present

## 2020-10-23 NOTE — Progress Notes (Signed)
Cardiology Office Note:    Date:  10/24/2020   ID:  Sherryl Barters, DOB 1935/06/26, MRN [id number]  PCP:  Deland Pretty, MD  Cardiologist:  None   Referring MD: Jacelyn Pi, MD   Chief Complaint  Patient presents with  . Irregular Heart Beat  . Coronary Artery Disease    History of Present Illness:    [name] is a 85 y.o. male with a hx of asymptomatic CAD (08/2020, Coronary Calcium Score 4) being seen for abnormal ZIO Monitor results of which I outlined below..   [name] has no particular cardiac complaints.  Says he is here because Dr. Shelia Media sent him.  Says adoptive father was concerned about something that had to do with the P waves.  He denies palpitations, syncope, chest pain, edema, transient neurological symptoms, PND, and dyspnea on exertion.  He stays physically active.  He is somewhat slower now than before because he has begun experiencing occasional imbalance.  Past Medical History:  Diagnosis Date  . Allergic rhinitis   . Asthma, mild last used prn inhaler 2 yrs ago   runs 10 miles weekly---   FOLLOWED BY DR Tarri Fuller YOUNG  . Frequency of urination   . H/O bronchitis SEASONAL   . History of gastroesophageal reflux (GERD) WATCHES DIET , NO PROBLEMS FOR A WHILE  . History of kidney stones   . Hx of tension headache   . Hypertension   . Mild sleep apnea USES BREATH-RITE STRIPS   NO CPAP  . White coat hypertension     Past Surgical History:  Procedure Laterality Date  . ANTERIOR CERVICAL DECOMP/DISCECTOMY FUSION  JAN 2007  . BLEPHAROPLASTY  2005   LEFT UPPER EYELID  . HYDROCELE EXCISION  10/08/2011   Procedure: HYDROCELECTOMY ADULT;  Surgeon: Bernestine Amass, MD;  Location: [facility];  Service: Urology;  Laterality: Left;  . LUMBAR FUSION  OCT 2005  . SPINE SURGERY     cervial and lumbar    Current Medications: Current Meds  Medication Sig  . aspirin 81 MG EC tablet Take 81 mg by mouth daily.  . Cholecalciferol (VITAMIN  D3) 25 MCG (1000 UT) CAPS Take 1 capsule by mouth daily.  . hydrochlorothiazide (HYDRODIURIL) 25 MG tablet Take 25 mg by mouth daily.  . methimazole (TAPAZOLE) 5 MG tablet Take 5 mg by mouth daily.  Marland Kitchen omeprazole (PRILOSEC) 20 MG capsule Take 20 mg by mouth every morning.  . Vibegron (GEMTESA) 75 MG TABS Take 75 mg by mouth daily.  . vitamin C (ASCORBIC ACID) 500 MG tablet Take 500 mg by mouth daily.     Allergies:   Codeine and Penicillins   Social History   Socioeconomic History  . Marital status: Married    Spouse name: Not on file  . Number of children: 1  . Years of education: Not on file  . Highest education level: Not on file  Occupational History  . Occupation: former Counsellor  Tobacco Use  . Smoking status: Former Smoker    Years: 18.00    Types: Cigarettes    Quit date: 06/11/1974    Years since quitting: 46.4  . Smokeless tobacco: Never Used  Substance and Sexual Activity  . Alcohol use: Yes    Alcohol/week: 1.0 standard drink    Types: 1 Cans of beer per week    Comment: 3-4 beers a week  . Drug use: Not on file  . Sexual activity: Not on file  Other Topics Concern  . Not on file  Social History Narrative   caffeine - 1 cup daily   Exercise - 2-3 times weekly   Positive history passive tobacco exposure   Social Determinants of Health   Financial Resource Strain: Not on file  Food Insecurity: Not on file  Transportation Needs: Not on file  Physical Activity: Not on file  Stress: Not on file  Social Connections: Not on file     Family History: The patient's family history includes Asthma in his daughter; Cancer in his father and mother.  ROS:   Please see the history of present illness.    He is troubled by lumbar radiculopathy.  While walk across a bridge when he is playing golf because he feels he may fall in.  He has a numb left foot.  Has some difficulty with balance.  He has pain off and on in the left leg but not in a pattern of  claudication.  All other systems reviewed and are negative.  EKGs/Labs/Other Studies Reviewed:    The following studies were reviewed today:  ZIO XT ( 6 days 20 hours):  Heart rate range 50 to 160 bpm with an average heart rate of 75.  Predominant underlying rhythm is normal sinus rhythm.  One 8 beat salvo of ventricular tachycardia noted (average rate 108 bpm)  3 episodes of nonsustained supraventricular tachycardia with the longest lasting 23 seconds at an average rate of 110 bpm appearing to represent an accelerated junctional rhythm with retrograde P waves noted in the ST segment.  No atrial fibrillation.  PVC burden 3.6%.  Supraventricular ectopic burden less than 1%.  CORONARY CALCIUM SCORE 08/11/2020:  IMPRESSION: 1. Coronary calcium score is 3.57 and this is at percentile 1 for patients of the same age, gender and ethnicity. 2.  Aortic Atherosclerosis (ICD10-I70.0). 3. Tiny pulmonary nodules, largest noncalcified nodule measuring 3 mm. These pulmonary nodules are indeterminate. No follow-up needed if patient is low-risk (and has no known or suspected primary neoplasm). Non-contrast chest CT can be considered in 12 months if patient is high-risk. This recommendation follows the consensus statement: Guidelines for Management of Incidental Pulmonary Nodules Detected on CT Images: From the Fleischner Society 2017; Radiology 2017; 284:228-243.  EKG:  EKG normal sinus rhythm with normal EKG appearance.  A single PVC is noted.  Recent Labs: No results found for requested labs within last 8760 hours.  Recent Lipid Panel    Component Value Date/Time   CHOL  07/02/2010 0607    150        ATP III CLASSIFICATION:  <200     mg/dL   Desirable  200-239  mg/dL   Borderline High  >=240    mg/dL   High          TRIG 49 07/02/2010 0607   HDL 45 07/02/2010 0607   CHOLHDL 3.3 07/02/2010 0607   VLDL 10 07/02/2010 0607   LDLCALC  07/02/2010 0607    95        Total  Cholesterol/HDL:CHD Risk Coronary Heart Disease Risk Table                     Men   Women  1/2 Average Risk   3.4   3.3  Average Risk       5.0   4.4  2 X Average Risk   9.6   7.1  3 X Average Risk  23.4   11.0  Use the calculated Patient Ratio above and the CHD Risk Table to determine the patient's CHD Risk.        ATP III CLASSIFICATION (LDL):  <100     mg/dL   Optimal  100-129  mg/dL   Near or Above                    Optimal  130-159  mg/dL   Borderline  160-189  mg/dL   High  >190     mg/dL   Very High    Physical Exam:    VS:  BP 122/68   Pulse 70   Ht 5\' 8"  (1.727 m)   Wt 195 lb 9.6 oz (88.7 kg)   SpO2 96%   BMI 29.74 kg/m     Wt Readings from Last 3 Encounters:  10/24/20 195 lb 9.6 oz (88.7 kg)  10/04/11 176 lb (79.8 kg)  12/05/10 192 lb 12.8 oz (87.5 kg)     GEN: 85 years of age appearing fit and younger than stated age.. No acute distress HEENT: Normal NECK: No JVD. LYMPHATICS: No lymphadenopathy CARDIAC: No murmur. RRR no gallop, or edema.  An occasional premature beat is heard. VASCULAR:  Normal Pulses. No bruits. RESPIRATORY:  Clear to auscultation without rales, wheezing or rhonchi  ABDOMEN: Soft, non-tender, non-distended, No pulsatile mass, MUSCULOSKELETAL: No deformity  SKIN: Warm and dry NEUROLOGIC:  Alert and oriented x 3 PSYCHIATRIC:  Normal affect   ASSESSMENT:    1. Agatston coronary artery calcium score less than 100   2. Irregular heart rhythm    PLAN:    In order of problems listed above:  1. His coronary calcium score is 4.  This represents an excellent predictor of low risk for vascular events going forward, especially at his age.  Encouraged continued physical activity.  His lipids are naturally low.  His blood pressure is under good control.  No specific recommendations or concerns. 2. The monitor has been reviewed in depth.  No atrial fibrillation is noted or any evidence of malignant arrhythmias.  No further evaluation  is necessary.  As needed follow-up.  All in all I think he is doing quite well from cardiovascular standpoint.  No further testing based upon his current clinical condition.  Medication Adjustments/Labs and Tests Ordered: Current medicines are reviewed at length with the patient today.  Concerns regarding medicines are outlined above.  Orders Placed This Encounter  Procedures  . EKG 12-Lead   No orders of the defined types were placed in this encounter.   Patient Instructions  Medication Instructions:  Your physician recommends that you continue on your current medications as directed. Please refer to the Current Medication list given to you today.  *If you need a refill on your cardiac medications before your next appointment, please call your pharmacy*   Lab Work: None If you have labs (blood work) drawn today and your tests are completely normal, you will receive your results only by: Marland Kitchen MyChart Message (if you have MyChart) OR . A paper copy in the mail If you have any lab test that is abnormal or we need to change your treatment, we will call you to review the results.   Testing/Procedures: None   Follow-Up: At [facility], you and your health needs are our priority.  As part of our continuing mission to provide you with exceptional heart care, we have created designated Provider Care Teams.  These Care Teams include your primary Cardiologist (physician) and  Advanced Practice Providers (APPs -  Physician Assistants and Nurse Practitioners) who all work together to provide you with the care you need, when you need it.  We recommend signing up for the patient portal called "MyChart".  Sign up information is provided on this After Visit Summary.  MyChart is used to connect with patients for Virtual Visits (Telemedicine).  Patients are able to view lab/test results, encounter notes, upcoming appointments, etc.  Non-urgent messages can be sent to your provider as well.   To  learn more about what you can do with MyChart, go to [email or web address].    Your next appointment:   As needed  The format for your next appointment:   In Person  Provider:   You may see Dr. Daneen Schick or one of the following Advanced Practice Providers on your designated Care Team:    Kathyrn Drown, NP    Other Instructions      Signed, Sinclair Grooms, MD  10/24/2020 12:46 PM    [place]

## 2020-10-24 ENCOUNTER — Other Ambulatory Visit: Payer: Self-pay

## 2020-10-24 ENCOUNTER — Encounter: Payer: Self-pay | Admitting: Interventional Cardiology

## 2020-10-24 ENCOUNTER — Ambulatory Visit (INDEPENDENT_AMBULATORY_CARE_PROVIDER_SITE_OTHER): Payer: Medicare Other | Admitting: Interventional Cardiology

## 2020-10-24 VITALS — BP 122/68 | HR 70 | Ht 68.0 in | Wt 195.6 lb

## 2020-10-24 DIAGNOSIS — I499 Cardiac arrhythmia, unspecified: Secondary | ICD-10-CM

## 2020-10-24 DIAGNOSIS — R931 Abnormal findings on diagnostic imaging of heart and coronary circulation: Secondary | ICD-10-CM

## 2020-10-24 NOTE — Patient Instructions (Signed)
Medication Instructions:  Your physician recommends that you continue on your current medications as directed. Please refer to the Current Medication list given to you today.  *If you need a refill on your cardiac medications before your next appointment, please call your pharmacy*   Lab Work: None If you have labs (blood work) drawn today and your tests are completely normal, you will receive your results only by: Marland Kitchen MyChart Message (if you have MyChart) OR . A paper copy in the mail If you have any lab test that is abnormal or we need to change your treatment, we will call you to review the results.   Testing/Procedures: None   Follow-Up: At [facility], you and your health needs are our priority.  As part of our continuing mission to provide you with exceptional heart care, we have created designated Provider Care Teams.  These Care Teams include your primary Cardiologist (physician) and Advanced Practice Providers (APPs -  Physician Assistants and Nurse Practitioners) who all work together to provide you with the care you need, when you need it.  We recommend signing up for the patient portal called "MyChart".  Sign up information is provided on this After Visit Summary.  MyChart is used to connect with patients for Virtual Visits (Telemedicine).  Patients are able to view lab/test results, encounter notes, upcoming appointments, etc.  Non-urgent messages can be sent to your provider as well.   To learn more about what you can do with MyChart, go to [email or web address].    Your next appointment:   As needed  The format for your next appointment:   In Person  Provider:   You may see Dr. Daneen Schick or one of the following Advanced Practice Providers on your designated Care Team:    Kathyrn Drown, NP    Other Instructions

## 2020-10-25 DIAGNOSIS — R260 Ataxic gait: Secondary | ICD-10-CM | POA: Diagnosis not present

## 2020-10-26 ENCOUNTER — Ambulatory Visit (INDEPENDENT_AMBULATORY_CARE_PROVIDER_SITE_OTHER): Payer: Medicare Other | Admitting: Pulmonary Disease

## 2020-10-26 ENCOUNTER — Other Ambulatory Visit: Payer: Self-pay

## 2020-10-26 ENCOUNTER — Ambulatory Visit (INDEPENDENT_AMBULATORY_CARE_PROVIDER_SITE_OTHER): Payer: Medicare Other

## 2020-10-26 ENCOUNTER — Encounter: Payer: Self-pay | Admitting: Pulmonary Disease

## 2020-10-26 VITALS — BP 118/70 | HR 74 | Ht 68.0 in | Wt 195.0 lb

## 2020-10-26 DIAGNOSIS — H532 Diplopia: Secondary | ICD-10-CM | POA: Insufficient documentation

## 2020-10-26 DIAGNOSIS — M810 Age-related osteoporosis without current pathological fracture: Secondary | ICD-10-CM | POA: Insufficient documentation

## 2020-10-26 DIAGNOSIS — Z860100 Personal history of colon polyps, unspecified: Secondary | ICD-10-CM | POA: Insufficient documentation

## 2020-10-26 DIAGNOSIS — N200 Calculus of kidney: Secondary | ICD-10-CM | POA: Insufficient documentation

## 2020-10-26 DIAGNOSIS — M542 Cervicalgia: Secondary | ICD-10-CM | POA: Insufficient documentation

## 2020-10-26 DIAGNOSIS — M6200 Separation of muscle (nontraumatic), unspecified site: Secondary | ICD-10-CM | POA: Insufficient documentation

## 2020-10-26 DIAGNOSIS — R350 Frequency of micturition: Secondary | ICD-10-CM | POA: Insufficient documentation

## 2020-10-26 DIAGNOSIS — M8580 Other specified disorders of bone density and structure, unspecified site: Secondary | ICD-10-CM | POA: Insufficient documentation

## 2020-10-26 DIAGNOSIS — R079 Chest pain, unspecified: Secondary | ICD-10-CM | POA: Insufficient documentation

## 2020-10-26 DIAGNOSIS — I10 Essential (primary) hypertension: Secondary | ICD-10-CM | POA: Insufficient documentation

## 2020-10-26 DIAGNOSIS — Z8669 Personal history of other diseases of the nervous system and sense organs: Secondary | ICD-10-CM | POA: Insufficient documentation

## 2020-10-26 DIAGNOSIS — Z7982 Long term (current) use of aspirin: Secondary | ICD-10-CM | POA: Insufficient documentation

## 2020-10-26 DIAGNOSIS — M792 Neuralgia and neuritis, unspecified: Secondary | ICD-10-CM | POA: Insufficient documentation

## 2020-10-26 DIAGNOSIS — R292 Abnormal reflex: Secondary | ICD-10-CM | POA: Insufficient documentation

## 2020-10-26 DIAGNOSIS — E0590 Thyrotoxicosis, unspecified without thyrotoxic crisis or storm: Secondary | ICD-10-CM | POA: Insufficient documentation

## 2020-10-26 DIAGNOSIS — Z8719 Personal history of other diseases of the digestive system: Secondary | ICD-10-CM | POA: Insufficient documentation

## 2020-10-26 DIAGNOSIS — R059 Cough, unspecified: Secondary | ICD-10-CM | POA: Diagnosis not present

## 2020-10-26 DIAGNOSIS — M2010 Hallux valgus (acquired), unspecified foot: Secondary | ICD-10-CM | POA: Insufficient documentation

## 2020-10-26 DIAGNOSIS — I499 Cardiac arrhythmia, unspecified: Secondary | ICD-10-CM | POA: Insufficient documentation

## 2020-10-26 DIAGNOSIS — B351 Tinea unguium: Secondary | ICD-10-CM | POA: Insufficient documentation

## 2020-10-26 DIAGNOSIS — K219 Gastro-esophageal reflux disease without esophagitis: Secondary | ICD-10-CM | POA: Insufficient documentation

## 2020-10-26 DIAGNOSIS — Z6829 Body mass index (BMI) 29.0-29.9, adult: Secondary | ICD-10-CM | POA: Insufficient documentation

## 2020-10-26 DIAGNOSIS — J45909 Unspecified asthma, uncomplicated: Secondary | ICD-10-CM | POA: Insufficient documentation

## 2020-10-26 DIAGNOSIS — M539 Dorsopathy, unspecified: Secondary | ICD-10-CM | POA: Insufficient documentation

## 2020-10-26 DIAGNOSIS — N401 Enlarged prostate with lower urinary tract symptoms: Secondary | ICD-10-CM | POA: Insufficient documentation

## 2020-10-26 MED ORDER — DOXYCYCLINE HYCLATE 100 MG PO TABS: 100.0000 mg | ORAL_TABLET | Freq: Two times a day (BID) | ORAL | 0 refills | Status: DC

## 2020-10-26 MED ORDER — PREDNISONE 10 MG PO TABS: 20.0000 mg | ORAL_TABLET | Freq: Every day | ORAL | 0 refills | Status: DC

## 2020-10-26 NOTE — Progress Notes (Signed)
Synopsis: Referred in May 2022 for cough  Subjective:   PATIENT ID: [name] GENDER: male DOB: 23-Mar-1936, MRN: [id number]  HPI  Chief Complaint  Patient presents with  . Consult    Cough for about 2 weeks, was non productive but now productive, tessalon and delsym, negative home covid test   [name] is an 85 year old male, former smoker who is referred to pulmonary clinic for cough.   He reports having the cough for about 10 days which has been productive of sputum that was initially clear in color. His sputum then changed to green in color and then brownish over recent days. He denies any fevers or chills. He does note having a mild episode of diaphoresis but nothing that persisted. He denies coughing up bright red blood, weight loss or lack of appetite. He has tried delsym cough syrup without relief. He denies chest pain or shortness of breath.   Past Medical History:  Diagnosis Date  . Allergic rhinitis   . Asthma, mild last used prn inhaler 2 yrs ago   runs 10 miles weekly---   FOLLOWED BY DR Tarri Fuller YOUNG  . Frequency of urination   . H/O bronchitis SEASONAL   . History of gastroesophageal reflux (GERD) WATCHES DIET , NO PROBLEMS FOR A WHILE  . History of kidney stones   . Hx of tension headache   . Hypertension   . Mild sleep apnea USES BREATH-RITE STRIPS   NO CPAP  . White coat hypertension      Family History  Problem Relation Age of Onset  . Cancer Mother   . Cancer Father   . Asthma Daughter      Social History   Socioeconomic History  . Marital status: Married    Spouse name: Not on file  . Number of children: 1  . Years of education: Not on file  . Highest education level: Not on file  Occupational History  . Occupation: former Counsellor  Tobacco Use  . Smoking status: Former Smoker    Years: 18.00    Types: Cigarettes    Quit date: 06/11/1974    Years since quitting: 46.4  . Smokeless tobacco: Never Used  Vaping Use  .  Vaping Use: Never used  Substance and Sexual Activity  . Alcohol use: Yes    Alcohol/week: 1.0 standard drink    Types: 1 Cans of beer per week    Comment: 3-4 beers a week  . Drug use: Never  . Sexual activity: Yes  Other Topics Concern  . Not on file  Social History Narrative   caffeine - 1 cup daily   Exercise - 2-3 times weekly   Positive history passive tobacco exposure   Social Determinants of Health   Financial Resource Strain: Not on file  Food Insecurity: Not on file  Transportation Needs: Not on file  Physical Activity: Not on file  Stress: Not on file  Social Connections: Not on file  Intimate Partner Violence: Not on file     Allergies  Allergen Reactions  . Codeine Shortness Of Breath  . Penicillins Rash     Outpatient Medications Prior to Visit  Medication Sig Dispense Refill  . aspirin 81 MG EC tablet Take 81 mg by mouth daily.    . benzonatate (TESSALON) 200 MG capsule Take 200 mg by mouth 3 (three) times daily.    . Cholecalciferol (VITAMIN D3) 25 MCG (1000 UT) CAPS Take 1 capsule by mouth  daily.    . hydrochlorothiazide (HYDRODIURIL) 25 MG tablet Take 25 mg by mouth daily.    . methimazole (TAPAZOLE) 5 MG tablet Take 5 mg by mouth daily.    Marland Kitchen omeprazole (PRILOSEC) 40 MG capsule Take 40 mg by mouth daily.    . Vibegron (GEMTESA) 75 MG TABS Take 75 mg by mouth daily.    Marland Kitchen omeprazole (PRILOSEC) 20 MG capsule Take 20 mg by mouth every morning.    . vitamin C (ASCORBIC ACID) 500 MG tablet Take 500 mg by mouth daily.     No facility-administered medications prior to visit.    Review of Systems  Constitutional: Negative for chills, fever, malaise/fatigue and weight loss.  HENT: Negative for congestion, sinus pain and sore throat.   Eyes: Negative.   Respiratory: Positive for cough and sputum production. Negative for hemoptysis, shortness of breath and wheezing.   Cardiovascular: Negative for chest pain, palpitations, orthopnea, claudication and leg  swelling.  Gastrointestinal: Negative for abdominal pain, heartburn, nausea and vomiting.  Genitourinary: Negative.   Musculoskeletal: Negative for joint pain and myalgias.  Skin: Negative for rash.  Neurological: Negative for weakness.  Endo/Heme/Allergies: Negative.   Psychiatric/Behavioral: Negative.     Objective:   Vitals:   10/26/20 1455  BP: 118/70  Pulse: 74  SpO2: 97%  Weight: 195 lb (88.5 kg)  Height: 5\' 8"  (1.727 m)     Physical Exam Constitutional:      General: He is not in acute distress. HENT:     Head: Normocephalic and atraumatic.  Eyes:     Extraocular Movements: Extraocular movements intact.     Conjunctiva/sclera: Conjunctivae normal.     Pupils: Pupils are equal, round, and reactive to light.  Cardiovascular:     Rate and Rhythm: Normal rate and regular rhythm.     Pulses: Normal pulses.     Heart sounds: Normal heart sounds. No murmur heard.   Pulmonary:     Effort: Pulmonary effort is normal.     Breath sounds: Examination of the left-upper field reveals decreased breath sounds. Examination of the left-middle field reveals decreased breath sounds. Decreased breath sounds present. No wheezing, rhonchi or rales.  Abdominal:     General: Bowel sounds are normal.     Palpations: Abdomen is soft.  Musculoskeletal:     Right lower leg: No edema.     Left lower leg: No edema.  Lymphadenopathy:     Cervical: No cervical adenopathy.  Skin:    General: Skin is warm and dry.  Neurological:     General: No focal deficit present.     Mental Status: He is alert.  Psychiatric:        Mood and Affect: Mood normal.        Behavior: Behavior normal.        Thought Content: Thought content normal.        Judgment: Judgment normal.     CBC    Component Value Date/Time   WBC 6.0 07/01/2010 1433   RBC 4.62 07/01/2010 1433   HGB 13.9 10/08/2011 0654   HCT 41.0 10/08/2011 0654   PLT 183 07/01/2010 1433   MCV 92.2 07/01/2010 1433   MCH 32.3  07/01/2010 1433   MCHC 35.0 07/01/2010 1433   RDW 12.7 07/01/2010 1433   LYMPHSABS 2.2 07/01/2010 1433   MONOABS 0.5 07/01/2010 1433   EOSABS 0.2 07/01/2010 1433   BASOSABS 0.0 07/01/2010 1433     Chest imaging: CXR 10/27/20 Bronchial cuffing  present. No opacities or infiltrates noted. No pleural effusion or pneumothorax.   PFT: No flowsheet data found.    Assessment & Plan:   Cough - Plan: DG Chest 2 View, doxycycline (VIBRA-TABS) 100 MG tablet, predniSONE (DELTASONE) 10 MG tablet  Discussion: [name] is an 85 year old male, former smoker who is referred to pulmonary clinic for cough.  He appears to have bronchitis given his productive cough for 10 days and bronchial cuffing noted on chest radiograph today. We will treat him with a 7 day course of doxycycline and 10 days of 20mg  of prednisone.   He can follow up as needed and is to call our clinic if his symptoms are not improved with the above treatment.Freda Jackson, MD [facility] Office: [phone]    Current Outpatient Medications:  .  aspirin 81 MG EC tablet, Take 81 mg by mouth daily., Disp: , Rfl:  .  benzonatate (TESSALON) 200 MG capsule, Take 200 mg by mouth 3 (three) times daily., Disp: , Rfl:  .  Cholecalciferol (VITAMIN D3) 25 MCG (1000 UT) CAPS, Take 1 capsule by mouth daily., Disp: , Rfl:  .  doxycycline (VIBRA-TABS) 100 MG tablet, Take 1 tablet (100 mg total) by mouth 2 (two) times daily., Disp: 14 tablet, Rfl: 0 .  hydrochlorothiazide (HYDRODIURIL) 25 MG tablet, Take 25 mg by mouth daily., Disp: , Rfl:  .  methimazole (TAPAZOLE) 5 MG tablet, Take 5 mg by mouth daily., Disp: , Rfl:  .  omeprazole (PRILOSEC) 40 MG capsule, Take 40 mg by mouth daily., Disp: , Rfl:  .  predniSONE (DELTASONE) 10 MG tablet, Take 2 tablets (20 mg total) by mouth daily with breakfast., Disp: 20 tablet, Rfl: 0 .  Vibegron (GEMTESA) 75 MG TABS, Take 75 mg by mouth daily., Disp: , Rfl:

## 2020-10-26 NOTE — Patient Instructions (Signed)
Take doxycycline 100mg  twice daily for 7 days  Take prednisone 20mg  daily for 10 days  We are treating you for bronchitis

## 2020-10-27 ENCOUNTER — Encounter: Payer: Self-pay | Admitting: Pulmonary Disease

## 2020-11-09 DIAGNOSIS — M25551 Pain in right hip: Secondary | ICD-10-CM | POA: Diagnosis not present

## 2020-11-09 DIAGNOSIS — M25552 Pain in left hip: Secondary | ICD-10-CM | POA: Diagnosis not present

## 2020-11-09 DIAGNOSIS — M5416 Radiculopathy, lumbar region: Secondary | ICD-10-CM | POA: Diagnosis not present

## 2020-11-14 DIAGNOSIS — K429 Umbilical hernia without obstruction or gangrene: Secondary | ICD-10-CM | POA: Diagnosis not present

## 2020-11-14 DIAGNOSIS — N281 Cyst of kidney, acquired: Secondary | ICD-10-CM | POA: Diagnosis not present

## 2020-11-14 DIAGNOSIS — R3121 Asymptomatic microscopic hematuria: Secondary | ICD-10-CM | POA: Diagnosis not present

## 2020-11-14 DIAGNOSIS — N200 Calculus of kidney: Secondary | ICD-10-CM | POA: Diagnosis not present

## 2020-12-08 DIAGNOSIS — X32XXXA Exposure to sunlight, initial encounter: Secondary | ICD-10-CM | POA: Diagnosis not present

## 2020-12-08 DIAGNOSIS — D0471 Carcinoma in situ of skin of right lower limb, including hip: Secondary | ICD-10-CM | POA: Diagnosis not present

## 2020-12-08 DIAGNOSIS — L570 Actinic keratosis: Secondary | ICD-10-CM | POA: Diagnosis not present

## 2020-12-08 DIAGNOSIS — D485 Neoplasm of uncertain behavior of skin: Secondary | ICD-10-CM | POA: Diagnosis not present

## 2020-12-08 DIAGNOSIS — C44729 Squamous cell carcinoma of skin of left lower limb, including hip: Secondary | ICD-10-CM | POA: Diagnosis not present

## 2020-12-09 DIAGNOSIS — M25552 Pain in left hip: Secondary | ICD-10-CM | POA: Diagnosis not present

## 2020-12-09 DIAGNOSIS — M25551 Pain in right hip: Secondary | ICD-10-CM | POA: Diagnosis not present

## 2020-12-09 DIAGNOSIS — M5416 Radiculopathy, lumbar region: Secondary | ICD-10-CM | POA: Diagnosis not present

## 2020-12-20 DIAGNOSIS — M25552 Pain in left hip: Secondary | ICD-10-CM | POA: Diagnosis not present

## 2020-12-20 DIAGNOSIS — M25551 Pain in right hip: Secondary | ICD-10-CM | POA: Diagnosis not present

## 2020-12-20 DIAGNOSIS — M5416 Radiculopathy, lumbar region: Secondary | ICD-10-CM | POA: Diagnosis not present

## 2020-12-27 DIAGNOSIS — M25551 Pain in right hip: Secondary | ICD-10-CM | POA: Diagnosis not present

## 2020-12-27 DIAGNOSIS — M5416 Radiculopathy, lumbar region: Secondary | ICD-10-CM | POA: Diagnosis not present

## 2020-12-27 DIAGNOSIS — M25552 Pain in left hip: Secondary | ICD-10-CM | POA: Diagnosis not present

## 2021-01-10 DIAGNOSIS — M5416 Radiculopathy, lumbar region: Secondary | ICD-10-CM | POA: Diagnosis not present

## 2021-01-10 DIAGNOSIS — M25552 Pain in left hip: Secondary | ICD-10-CM | POA: Diagnosis not present

## 2021-01-10 DIAGNOSIS — M25551 Pain in right hip: Secondary | ICD-10-CM | POA: Diagnosis not present

## 2021-01-26 DIAGNOSIS — C44729 Squamous cell carcinoma of skin of left lower limb, including hip: Secondary | ICD-10-CM | POA: Diagnosis not present

## 2021-02-07 DIAGNOSIS — M5416 Radiculopathy, lumbar region: Secondary | ICD-10-CM | POA: Diagnosis not present

## 2021-02-07 DIAGNOSIS — M25552 Pain in left hip: Secondary | ICD-10-CM | POA: Diagnosis not present

## 2021-02-07 DIAGNOSIS — M25551 Pain in right hip: Secondary | ICD-10-CM | POA: Diagnosis not present

## 2021-02-09 DIAGNOSIS — D0471 Carcinoma in situ of skin of right lower limb, including hip: Secondary | ICD-10-CM | POA: Diagnosis not present

## 2021-02-16 DIAGNOSIS — M25551 Pain in right hip: Secondary | ICD-10-CM | POA: Diagnosis not present

## 2021-02-16 DIAGNOSIS — M5416 Radiculopathy, lumbar region: Secondary | ICD-10-CM | POA: Diagnosis not present

## 2021-02-16 DIAGNOSIS — M25552 Pain in left hip: Secondary | ICD-10-CM | POA: Diagnosis not present

## 2021-02-20 DIAGNOSIS — M25551 Pain in right hip: Secondary | ICD-10-CM | POA: Diagnosis not present

## 2021-02-20 DIAGNOSIS — M25552 Pain in left hip: Secondary | ICD-10-CM | POA: Diagnosis not present

## 2021-02-20 DIAGNOSIS — M5416 Radiculopathy, lumbar region: Secondary | ICD-10-CM | POA: Diagnosis not present

## 2021-02-28 DIAGNOSIS — M5416 Radiculopathy, lumbar region: Secondary | ICD-10-CM | POA: Diagnosis not present

## 2021-02-28 DIAGNOSIS — M25551 Pain in right hip: Secondary | ICD-10-CM | POA: Diagnosis not present

## 2021-02-28 DIAGNOSIS — M25552 Pain in left hip: Secondary | ICD-10-CM | POA: Diagnosis not present

## 2021-03-09 DIAGNOSIS — E0590 Thyrotoxicosis, unspecified without thyrotoxic crisis or storm: Secondary | ICD-10-CM | POA: Diagnosis not present

## 2021-03-13 DIAGNOSIS — E0590 Thyrotoxicosis, unspecified without thyrotoxic crisis or storm: Secondary | ICD-10-CM | POA: Diagnosis not present

## 2021-03-13 DIAGNOSIS — Z23 Encounter for immunization: Secondary | ICD-10-CM | POA: Diagnosis not present

## 2021-03-13 DIAGNOSIS — M8589 Other specified disorders of bone density and structure, multiple sites: Secondary | ICD-10-CM | POA: Diagnosis not present

## 2021-03-13 DIAGNOSIS — I10 Essential (primary) hypertension: Secondary | ICD-10-CM | POA: Diagnosis not present

## 2021-03-13 DIAGNOSIS — E559 Vitamin D deficiency, unspecified: Secondary | ICD-10-CM | POA: Diagnosis not present

## 2021-03-13 DIAGNOSIS — M8580 Other specified disorders of bone density and structure, unspecified site: Secondary | ICD-10-CM | POA: Diagnosis not present

## 2021-03-16 DIAGNOSIS — R1310 Dysphagia, unspecified: Secondary | ICD-10-CM | POA: Diagnosis not present

## 2021-03-16 DIAGNOSIS — K2270 Barrett's esophagus without dysplasia: Secondary | ICD-10-CM | POA: Diagnosis not present

## 2021-03-16 DIAGNOSIS — R1013 Epigastric pain: Secondary | ICD-10-CM | POA: Diagnosis not present

## 2021-04-10 DIAGNOSIS — N3941 Urge incontinence: Secondary | ICD-10-CM | POA: Diagnosis not present

## 2021-04-10 DIAGNOSIS — N401 Enlarged prostate with lower urinary tract symptoms: Secondary | ICD-10-CM | POA: Diagnosis not present

## 2021-04-10 DIAGNOSIS — M5416 Radiculopathy, lumbar region: Secondary | ICD-10-CM | POA: Diagnosis not present

## 2021-04-10 DIAGNOSIS — M25551 Pain in right hip: Secondary | ICD-10-CM | POA: Diagnosis not present

## 2021-04-10 DIAGNOSIS — M25552 Pain in left hip: Secondary | ICD-10-CM | POA: Diagnosis not present

## 2021-04-10 DIAGNOSIS — R351 Nocturia: Secondary | ICD-10-CM | POA: Diagnosis not present

## 2021-04-10 DIAGNOSIS — R8271 Bacteriuria: Secondary | ICD-10-CM | POA: Diagnosis not present

## 2021-04-18 DIAGNOSIS — H5000 Unspecified esotropia: Secondary | ICD-10-CM | POA: Diagnosis not present

## 2021-04-18 DIAGNOSIS — H40033 Anatomical narrow angle, bilateral: Secondary | ICD-10-CM | POA: Diagnosis not present

## 2021-04-18 DIAGNOSIS — Z9889 Other specified postprocedural states: Secondary | ICD-10-CM | POA: Diagnosis not present

## 2021-04-18 DIAGNOSIS — Z961 Presence of intraocular lens: Secondary | ICD-10-CM | POA: Diagnosis not present

## 2021-04-18 DIAGNOSIS — D3132 Benign neoplasm of left choroid: Secondary | ICD-10-CM | POA: Diagnosis not present

## 2021-05-01 DIAGNOSIS — M5416 Radiculopathy, lumbar region: Secondary | ICD-10-CM | POA: Diagnosis not present

## 2021-05-01 DIAGNOSIS — M25551 Pain in right hip: Secondary | ICD-10-CM | POA: Diagnosis not present

## 2021-05-01 DIAGNOSIS — M25552 Pain in left hip: Secondary | ICD-10-CM | POA: Diagnosis not present

## 2021-05-08 ENCOUNTER — Telehealth: Payer: Self-pay | Admitting: Pulmonary Disease

## 2021-05-08 DIAGNOSIS — M25552 Pain in left hip: Secondary | ICD-10-CM | POA: Diagnosis not present

## 2021-05-08 DIAGNOSIS — M5416 Radiculopathy, lumbar region: Secondary | ICD-10-CM | POA: Diagnosis not present

## 2021-05-08 DIAGNOSIS — M25551 Pain in right hip: Secondary | ICD-10-CM | POA: Diagnosis not present

## 2021-05-08 NOTE — Telephone Encounter (Signed)
Called and spoke with patient. He stated that his cough had disappeared until September. The cough has mostly been unproductive. He does not have any other symptoms. His PCP prescribed Tessalon perles but they did not help.   I advised him that since he hadn't been seen since May 2022, it might be a good idea to get an appt. He agreed. I was able to get him scheduled with JD at [facility] on 05/10/21.   Nothing further needed at time of call.

## 2021-05-10 ENCOUNTER — Other Ambulatory Visit: Payer: Self-pay

## 2021-05-10 ENCOUNTER — Encounter: Payer: Self-pay | Admitting: Pulmonary Disease

## 2021-05-10 ENCOUNTER — Ambulatory Visit (INDEPENDENT_AMBULATORY_CARE_PROVIDER_SITE_OTHER): Payer: Medicare Other | Admitting: Pulmonary Disease

## 2021-05-10 ENCOUNTER — Ambulatory Visit (INDEPENDENT_AMBULATORY_CARE_PROVIDER_SITE_OTHER): Payer: Medicare Other

## 2021-05-10 VITALS — BP 120/62 | HR 78 | Temp 97.9°F | Ht 68.0 in | Wt 188.0 lb

## 2021-05-10 DIAGNOSIS — R059 Cough, unspecified: Secondary | ICD-10-CM | POA: Diagnosis not present

## 2021-05-10 DIAGNOSIS — R058 Other specified cough: Secondary | ICD-10-CM

## 2021-05-10 DIAGNOSIS — R053 Chronic cough: Secondary | ICD-10-CM

## 2021-05-10 MED ORDER — BUDESONIDE-FORMOTEROL FUMARATE 160-4.5 MCG/ACT IN AERO: 2.0000 | INHALATION_SPRAY | Freq: Two times a day (BID) | RESPIRATORY_TRACT | 12 refills | Status: DC

## 2021-05-10 NOTE — Progress Notes (Signed)
Synopsis: Referred in May 2022 for cough  Subjective:   PATIENT ID: [name] GENDER: male DOB: [birth year], MRN: [id number]  HPI  Chief Complaint  Patient presents with   Follow-up    Pt states that he is still having congestion. Had chest x ray before this visit. But pt states cough is doing better.     [name] is an 85 year old male, former smoker who returns to pulmonary clinic for cough.   He was treated for bronchitis in 10/2020 with course of prednisone and doxycycline with improvement of his cough for a few weeks.   The cough returned in September and continued to progress in October. He is bringing up some mucous but not like previously in May. The sputum is clear.  He denies shortness of breath.  He does have some intermittent wheezing.  He reports the cough is worse in the evening/nighttime.  He has not been exercising much over the past 2 weeks as he has felt more tired and he did not want to go to the gym with the cough.  He does report increasing seasonal allergy symptoms as he is getting older.  He does report some for allergies.  He does report some weight loss since last visit as he weighs 188 pounds today and previously weighed 195 pounds.  His appetite has remained stable.  He has plans to see a GI doctor in the near future he has some GERD symptoms.  OV 10/26/20 He reports having the cough for about 10 days which has been productive of sputum that was initially clear in color. His sputum then changed to green in color and then brownish over recent days. He denies any fevers or chills. He does note having a mild episode of diaphoresis but nothing that persisted. He denies coughing up bright red blood, weight loss or lack of appetite. He has tried delsym cough syrup without relief. He denies chest pain or shortness of breath.   Past Medical History:  Diagnosis Date   Allergic rhinitis    Asthma, mild last used prn inhaler 2 yrs ago   runs 10 miles  weekly---   FOLLOWED BY DR Tarri Fuller YOU[facility]   Frequency of urination    H/O bronchitis SEASONAL    History of gastroesophageal reflux (GERD) WATCHES DIET , NO PROBLEMS FOR A WHILE   History of kidney stones    Hx of tension headache    Hypertension    Mild sleep apnea USES BREATH-RITE STRIPS   NO CPAP   White coat hypertension      Family History  Problem Relation Age of Onset   Cancer Mother    Cancer Father    Asthma Daughter      Social History   Socioeconomic History   Marital status: Married    Spouse name: Not on file   Number of children: 1   Years of education: Not on file   Highest education level: Not on file  Occupational History   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[employer]  Tobacco Use   Smoking status: Former    Years: 18.00    Types: Cigarettes    Quit date: 06/11/1974    Years since quitting: 46.9   Smokeless tobacco: Never  Vaping Use   Vaping Use: Never used  Substance and Sexual Activity   Alcohol use: Yes    Alcohol/week: 1.0 standard drink    Types: 1 Cans of beer per week    Comment: 3-4 beers  a week   Drug use: Never   Sexual activity: Yes  Other Topics Concern   Not on file  Social History Narrative   caffeine - 1 cup daily   Exercise - 2-3 times weekly   Positive history passive tobacco exposure   Social Determinants of Health   Financial Resource Strain: Not on file  Food Insecurity: Not on file  Transportation Needs: Not on file  Physical Activity: Not on file  Stress: Not on file  Social Connections: Not on file  Intimate Partner Violence: Not on file     Allergies  Allergen Reactions   Codeine Shortness Of Breath   Penicillins Rash     Outpatient Medications Prior to Visit  Medication Sig Dispense Refill   aspirin 81 MG EC tablet Take 81 mg by mouth daily.     benzonatate (TESSALON) 200 MG capsule Take 200 mg by mouth 3 (three) times daily.     Cholecalciferol (VITAMIN D3) 25 MCG (1000 UT) CAPS Take 1 capsule by mouth daily.      hydrochlorothiazide (HYDRODIURIL) 25 MG tablet Take 25 mg by mouth daily.     methimazole (TAPAZOLE) 5 MG tablet Take 5 mg by mouth daily.     omeprazole (PRILOSEC) 40 MG capsule Take 40 mg by mouth daily.     Vibegron (GEMTESA) 75 MG TABS Take 75 mg by mouth daily.     doxycycline (VIBRA-TABS) 100 MG tablet Take 1 tablet (100 mg total) by mouth 2 (two) times daily. 14 tablet 0   predniSONE (DELTASONE) 10 MG tablet Take 2 tablets (20 mg total) by mouth daily with breakfast. 20 tablet 0   No facility-administered medications prior to visit.    Review of Systems  Constitutional:  Negative for chills, fever, malaise/fatigue and weight loss.  HENT:  Negative for congestion, sinus pain and sore throat.   Eyes: Negative.   Respiratory:  Positive for cough, sputum production and wheezing. Negative for hemoptysis and shortness of breath.   Cardiovascular:  Negative for chest pain, palpitations, orthopnea, claudication and leg swelling.  Gastrointestinal:  Negative for abdominal pain, heartburn, nausea and vomiting.  Genitourinary: Negative.   Musculoskeletal:  Negative for joint pain and myalgias.  Skin:  Negative for rash.  Neurological:  Negative for weakness.  Endo/Heme/Allergies: Negative.   Psychiatric/Behavioral: Negative.     Objective:   Vitals:   05/10/21 0905  BP: 120/62  Pulse: 78  Temp: 97.9 F (36.6 C)  TempSrc: Oral  SpO2: 98%  Weight: 188 lb (85.3 kg)  Height: 5\' 8"  (1.727 m)   Physical Exam Constitutional:      General: He is not in acute distress. HENT:     Head: Normocephalic and atraumatic.  Eyes:     Extraocular Movements: Extraocular movements intact.     Conjunctiva/sclera: Conjunctivae normal.     Pupils: Pupils are equal, round, and reactive to light.  Cardiovascular:     Rate and Rhythm: Normal rate and regular rhythm.     Pulses: Normal pulses.     Heart sounds: Normal heart sounds. No murmur heard. Pulmonary:     Effort: Pulmonary effort is  normal.     Breath sounds: Decreased breath sounds present. No wheezing, rhonchi or rales.  Abdominal:     General: Bowel sounds are normal.     Palpations: Abdomen is soft.  Musculoskeletal:     Right lower leg: No edema.     Left lower leg: No edema.  Lymphadenopathy:  Cervical: No cervical adenopathy.  Skin:    General: Skin is warm and dry.  Neurological:     General: No focal deficit present.     Mental Status: He is alert.    CBC    Component Value Date/Time   WBC 6.0 07/01/2010 1433   RBC 4.62 07/01/2010 1433   HGB 13.9 10/08/2011 0654   HCT 41.0 10/08/2011 0654   PLT 183 07/01/2010 1433   MCV 92.2 07/01/2010 1433   MCH 32.3 07/01/2010 1433   MCHC 35.0 07/01/2010 1433   RDW 12.7 07/01/2010 1433   LYMPHSABS 2.2 07/01/2010 1433   MONOABS 0.5 07/01/2010 1433   EOSABS 0.2 07/01/2010 1433   BASOSABS 0.0 07/01/2010 1433   BMP Latest Ref Rng & Units 10/08/2011 07/01/2010  Glucose 70 - 99 mg/dL 106(H) 104(H)  BUN 6 - 23 mg/dL 19 12  Creatinine 0.50 - 1.35 mg/dL 1.00 0.96  Sodium 135 - 145 mEq/L 143 140  Potassium 3.5 - 5.1 mEq/L 4.0 3.7  Chloride 96 - 112 mEq/L 107 106  CO2 19 - 32 mEq/L - 27  Calcium 8.4 - 10.5 mg/dL - 8.8   Chest imaging: 05/10/21 The cardiac silhouette, mediastinal and hilar contours are within normal limits and stable. Mild chronic appearing bronchitic type lung changes but no infiltrates, edema or effusions. No pulmonary lesions. The bony thorax is intact.  CXR 10/27/20 Bronchial cuffing present. No opacities or infiltrates noted. No pleural effusion or pneumothorax.   PFT: No flowsheet data found.    Assessment & Plan:   Chronic cough - Plan: CT CHEST HIGH RESOLUTION, budesonide-formoterol (SYMBICORT) 160-4.5 MCG/ACT inhaler  Discussion: [name] is an 85 year old male, former smoker who returns to pulmonary clinic for cough.   Repeat chest radiograph today continues to show peribronchial cuffing concerning for mild  bronchitic changes.  We will start him on ICS/LABA inhaler therapy with Symbicort 160-4.52 puffs twice daily.  We will also obtain a high-resolution chest CT scan for further evaluation of his airways and lung parenchyma given his chronic cough.  He is to take fexofenadine 180 mg daily for seasonal fall allergies.  Follow-up in 4 weeks.  Freda Jackson, MD [facility] Office: [phone]    Current Outpatient Medications:    aspirin 81 MG EC tablet, Take 81 mg by mouth daily., Disp: , Rfl:    benzonatate (TESSALON) 200 MG capsule, Take 200 mg by mouth 3 (three) times daily., Disp: , Rfl:    budesonide-formoterol (SYMBICORT) 160-4.5 MCG/ACT inhaler, Inhale 2 puffs into the lungs 2 (two) times daily., Disp: 1 each, Rfl: 12   Cholecalciferol (VITAMIN D3) 25 MCG (1000 UT) CAPS, Take 1 capsule by mouth daily., Disp: , Rfl:    hydrochlorothiazide (HYDRODIURIL) 25 MG tablet, Take 25 mg by mouth daily., Disp: , Rfl:    methimazole (TAPAZOLE) 5 MG tablet, Take 5 mg by mouth daily., Disp: , Rfl:    omeprazole (PRILOSEC) 40 MG capsule, Take 40 mg by mouth daily., Disp: , Rfl:    Vibegron (GEMTESA) 75 MG TABS, Take 75 mg by mouth daily., Disp: , Rfl:

## 2021-05-10 NOTE — Patient Instructions (Addendum)
The cough appears to be from allergies and possible reactive airways disease.  Take Allegra (fexofenadine 180mg  24Hr tablet) 1 tablet daily.  Use symbicort inhaler 2 puffs twice daily - rinse mouth out after each use  We will check a CT chest scan to further evaluate your lung tissue and airways to make sure there isn't an inflammatory component of the lungs leading to your cough.

## 2021-05-12 ENCOUNTER — Telehealth: Payer: Self-pay | Admitting: Pulmonary Disease

## 2021-05-12 NOTE — Telephone Encounter (Signed)
I called the patient and left a detailed message per the patient request that I did not see that anyone called him from the office. Nothing further needed.

## 2021-05-16 DIAGNOSIS — M5416 Radiculopathy, lumbar region: Secondary | ICD-10-CM | POA: Diagnosis not present

## 2021-05-16 DIAGNOSIS — M25552 Pain in left hip: Secondary | ICD-10-CM | POA: Diagnosis not present

## 2021-05-16 DIAGNOSIS — M25551 Pain in right hip: Secondary | ICD-10-CM | POA: Diagnosis not present

## 2021-05-23 DIAGNOSIS — R351 Nocturia: Secondary | ICD-10-CM | POA: Diagnosis not present

## 2021-05-23 DIAGNOSIS — N3941 Urge incontinence: Secondary | ICD-10-CM | POA: Diagnosis not present

## 2021-05-26 DIAGNOSIS — M791 Myalgia, unspecified site: Secondary | ICD-10-CM | POA: Diagnosis not present

## 2021-05-30 DIAGNOSIS — N3941 Urge incontinence: Secondary | ICD-10-CM | POA: Diagnosis not present

## 2021-05-30 DIAGNOSIS — M47816 Spondylosis without myelopathy or radiculopathy, lumbar region: Secondary | ICD-10-CM | POA: Diagnosis not present

## 2021-06-01 ENCOUNTER — Other Ambulatory Visit: Payer: Self-pay

## 2021-06-01 ENCOUNTER — Ambulatory Visit
Admission: RE | Admit: 2021-06-01 | Discharge: 2021-06-01 | Disposition: A | Discharge status: Home / Self Care | Payer: Medicare Other | Source: Ambulatory Visit | Attending: Pulmonary Disease | Admitting: Pulmonary Disease

## 2021-06-01 DIAGNOSIS — J8410 Pulmonary fibrosis, unspecified: Secondary | ICD-10-CM | POA: Diagnosis not present

## 2021-06-01 DIAGNOSIS — R918 Other nonspecific abnormal finding of lung field: Secondary | ICD-10-CM | POA: Diagnosis not present

## 2021-06-01 DIAGNOSIS — R053 Chronic cough: Secondary | ICD-10-CM

## 2021-06-01 DIAGNOSIS — J432 Centrilobular emphysema: Secondary | ICD-10-CM | POA: Diagnosis not present

## 2021-06-06 DIAGNOSIS — N3941 Urge incontinence: Secondary | ICD-10-CM | POA: Diagnosis not present

## 2021-06-07 ENCOUNTER — Encounter: Payer: Self-pay | Admitting: Pulmonary Disease

## 2021-06-07 ENCOUNTER — Ambulatory Visit (INDEPENDENT_AMBULATORY_CARE_PROVIDER_SITE_OTHER): Payer: Medicare Other | Admitting: Pulmonary Disease

## 2021-06-07 ENCOUNTER — Other Ambulatory Visit: Payer: Self-pay

## 2021-06-07 VITALS — BP 124/70 | HR 71 | Ht 68.0 in | Wt 185.0 lb

## 2021-06-07 DIAGNOSIS — R053 Chronic cough: Secondary | ICD-10-CM | POA: Diagnosis not present

## 2021-06-07 DIAGNOSIS — J432 Centrilobular emphysema: Secondary | ICD-10-CM | POA: Diagnosis not present

## 2021-06-07 NOTE — Patient Instructions (Addendum)
Continue symbicort 2 puffs twice daily - rinse mouth out after each use  We will schedule you for follow up in 4 months with pulmonary function tests.

## 2021-06-07 NOTE — Progress Notes (Signed)
Synopsis: Referred in May 2022 for cough  Subjective:   PATIENT ID: [name] GENDER: male DOB: 05-14-1936, MRN: [id number]  HPI  Chief Complaint  Patient presents with   Follow-up    F/U after CT scan on 12/23. States he has been doing well since last visit.     [name] is an 85 year old male, former smoker who returns to pulmonary clinic for cough.   Patient was started on Symbicort 160-4.5 MCG 2 puffs twice daily after last visit for concern of bronchitis.  He reports his cough is near completely resolved at this time.  A high-resolution CT chest scan was obtained due to the chronic cough which revealed mild diffuse centrilobular emphysematous changes and millimetric pulmonary nodules which are unchanged since 08/11/2020.  OV 05/10/21 He was treated for bronchitis in 10/2020 with course of prednisone and doxycycline with improvement of his cough for a few weeks.   The cough returned in September and continued to progress in October. He is bringing up some mucous but not like previously in May. The sputum is clear.  He denies shortness of breath.  He does have some intermittent wheezing.  He reports the cough is worse in the evening/nighttime.  He has not been exercising much over the past 2 weeks as he has felt more tired and he did not want to go to the gym with the cough.  He does report increasing seasonal allergy symptoms as he is getting older.  He does report some for allergies.  He does report some weight loss since last visit as he weighs 188 pounds today and previously weighed 195 pounds.  His appetite has remained stable.  He has plans to see a GI doctor in the near future he has some GERD symptoms.  OV 10/26/20 He reports having the cough for about 10 days which has been productive of sputum that was initially clear in color. His sputum then changed to green in color and then brownish over recent days. He denies any fevers or chills. He does note having a  mild episode of diaphoresis but nothing that persisted. He denies coughing up bright red blood, weight loss or lack of appetite. He has tried delsym cough syrup without relief. He denies chest pain or shortness of breath.   Past Medical History:  Diagnosis Date   Allergic rhinitis    Asthma, mild last used prn inhaler 2 yrs ago   runs 10 miles weekly---   FOLLOWED BY DR Tarri Fuller YOUNG   Frequency of urination    H/O bronchitis SEASONAL    History of gastroesophageal reflux (GERD) WATCHES DIET , NO PROBLEMS FOR A WHILE   History of kidney stones    Hx of tension headache    Hypertension    Mild sleep apnea USES BREATH-RITE STRIPS   NO CPAP   White coat hypertension      Family History  Problem Relation Age of Onset   Cancer Mother    Cancer Father    Asthma Daughter      Social History   Socioeconomic History   Marital status: Married    Spouse name: Not on file   Number of children: 1   Years of education: Not on file   Highest education level: Not on file  Occupational History   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[employer]  Tobacco Use   Smoking status: Former    Years: 18.00    Types: Cigarettes    Quit date:  06/11/1974    Years since quitting: 47.0   Smokeless tobacco: Never  Vaping Use   Vaping Use: Never used  Substance and Sexual Activity   Alcohol use: Yes    Alcohol/week: 1.0 standard drink    Types: 1 Cans of beer per week    Comment: 3-4 beers a week   Drug use: Never   Sexual activity: Yes  Other Topics Concern   Not on file  Social History Narrative   caffeine - 1 cup daily   Exercise - 2-3 times weekly   Positive history passive tobacco exposure   Social Determinants of Health   Financial Resource Strain: Not on file  Food Insecurity: Not on file  Transportation Needs: Not on file  Physical Activity: Not on file  Stress: Not on file  Social Connections: Not on file  Intimate Partner Violence: Not on file     Allergies  Allergen Reactions    Codeine Shortness Of Breath   Penicillins Rash     Outpatient Medications Prior to Visit  Medication Sig Dispense Refill   ascorbic acid (VITAMIN C) 1000 MG tablet Take 1,000 mg by mouth daily.     aspirin 81 MG EC tablet Take 81 mg by mouth daily.     budesonide-formoterol (SYMBICORT) 160-4.5 MCG/ACT inhaler Inhale 2 puffs into the lungs 2 (two) times daily. 1 each 12   Chlorphen-PE-Acetaminophen (NOREL AD) 4-10-325 MG TABS 1 tablet     Cholecalciferol (VITAMIN D3) 25 MCG (1000 UT) CAPS Take 1 capsule by mouth daily.     hydrochlorothiazide (HYDRODIURIL) 25 MG tablet Take 25 mg by mouth daily.     methimazole (TAPAZOLE) 5 MG tablet Take 5 mg by mouth daily.     Multiple Vitamin (MULTI-VITAMIN) tablet Take 1 tablet by mouth daily.     niacin 500 MG tablet Take 500 mg by mouth at bedtime.     omeprazole (PRILOSEC) 40 MG capsule Take 40 mg by mouth daily.     Vibegron (GEMTESA) 75 MG TABS Take 75 mg by mouth daily.     benzonatate (TESSALON) 200 MG capsule Take 200 mg by mouth 3 (three) times daily.     cholecalciferol (VITAMIN D3) 25 MCG (1000 UNIT) tablet 1 tablet     niacin 250 MG tablet Take by mouth.     No facility-administered medications prior to visit.    Review of Systems  Constitutional:  Negative for chills, fever, malaise/fatigue and weight loss.  HENT:  Negative for congestion, sinus pain and sore throat.   Eyes: Negative.   Respiratory:  Negative for cough, hemoptysis, sputum production, shortness of breath and wheezing.   Cardiovascular:  Negative for chest pain, palpitations, orthopnea, claudication and leg swelling.  Gastrointestinal:  Negative for abdominal pain, heartburn, nausea and vomiting.  Genitourinary: Negative.   Musculoskeletal:  Negative for joint pain and myalgias.  Skin:  Negative for rash.  Neurological:  Negative for weakness.  Endo/Heme/Allergies: Negative.   Psychiatric/Behavioral: Negative.     Objective:   Vitals:   06/07/21 1634  BP:  124/70  Pulse: 71  SpO2: 99%  Weight: 185 lb (83.9 kg)  Height: 5\' 8"  (1.727 m)    Physical Exam Constitutional:      General: He is not in acute distress. HENT:     Head: Normocephalic and atraumatic.  Eyes:     Conjunctiva/sclera: Conjunctivae normal.  Cardiovascular:     Rate and Rhythm: Normal rate and regular rhythm.     Pulses: Normal  pulses.     Heart sounds: Normal heart sounds. No murmur heard. Pulmonary:     Effort: Pulmonary effort is normal.     Breath sounds: No wheezing, rhonchi or rales.  Musculoskeletal:     Right lower leg: No edema.     Left lower leg: No edema.  Skin:    General: Skin is warm and dry.  Neurological:     General: No focal deficit present.     Mental Status: He is alert.    CBC    Component Value Date/Time   WBC 6.0 07/01/2010 1433   RBC 4.62 07/01/2010 1433   HGB 13.9 10/08/2011 0654   HCT 41.0 10/08/2011 0654   PLT 183 07/01/2010 1433   MCV 92.2 07/01/2010 1433   MCH 32.3 07/01/2010 1433   MCHC 35.0 07/01/2010 1433   RDW 12.7 07/01/2010 1433   LYMPHSABS 2.2 07/01/2010 1433   MONOABS 0.5 07/01/2010 1433   EOSABS 0.2 07/01/2010 1433   BASOSABS 0.0 07/01/2010 1433   BMP Latest Ref Rng & Units 10/08/2011 07/01/2010  Glucose 70 - 99 mg/dL 106(H) 104(H)  BUN 6 - 23 mg/dL 19 12  Creatinine 0.50 - 1.35 mg/dL 1.00 0.96  Sodium 135 - 145 mEq/L 143 140  Potassium 3.5 - 5.1 mEq/L 4.0 3.7  Chloride 96 - 112 mEq/L 107 106  CO2 19 - 32 mEq/L - 27  Calcium 8.4 - 10.5 mg/dL - 8.8   Chest imaging: HRCT Chest 06/02/21 1. Millimetric pulmonary nodules are unchanged from 08/11/2020 and are likely benign. 2. Right renal stone. 3.  Emphysema (ICD10-J43.9).  05/10/21 The cardiac silhouette, mediastinal and hilar contours are within normal limits and stable. Mild chronic appearing bronchitic type lung changes but no infiltrates, edema or effusions. No pulmonary lesions. The bony thorax is intact.  CXR 10/27/20 Bronchial cuffing present.  No opacities or infiltrates noted. No pleural effusion or pneumothorax.   PFT: No flowsheet data found.    Assessment & Plan:   Centrilobular emphysema ([place]) - Plan: Pulmonary Function Test  Chronic cough  Discussion: Edgard Debord is an 85 year old male, former smoker who returns to pulmonary clinic for cough.   Patient's chronic cough which has intermittently worsened at times over the recent past is secondary to emphysema/COPD.  He has had significant symptom resolution with ICS/LABA inhaler therapy with Symbicort 160-4.5 MCG 2 puffs twice daily.  Follow-up in 4 months with pulmonary function test.  Freda Jackson, MD [facility] Office: [phone]    Current Outpatient Medications:    ascorbic acid (VITAMIN C) 1000 MG tablet, Take 1,000 mg by mouth daily., Disp: , Rfl:    aspirin 81 MG EC tablet, Take 81 mg by mouth daily., Disp: , Rfl:    budesonide-formoterol (SYMBICORT) 160-4.5 MCG/ACT inhaler, Inhale 2 puffs into the lungs 2 (two) times daily., Disp: 1 each, Rfl: 12   Chlorphen-PE-Acetaminophen (NOREL AD) 4-10-325 MG TABS, 1 tablet, Disp: , Rfl:    Cholecalciferol (VITAMIN D3) 25 MCG (1000 UT) CAPS, Take 1 capsule by mouth daily., Disp: , Rfl:    hydrochlorothiazide (HYDRODIURIL) 25 MG tablet, Take 25 mg by mouth daily., Disp: , Rfl:    methimazole (TAPAZOLE) 5 MG tablet, Take 5 mg by mouth daily., Disp: , Rfl:    Multiple Vitamin (MULTI-VITAMIN) tablet, Take 1 tablet by mouth daily., Disp: , Rfl:    niacin 500 MG tablet, Take 500 mg by mouth at bedtime., Disp: , Rfl:    omeprazole (PRILOSEC)  40 MG capsule, Take 40 mg by mouth daily., Disp: , Rfl:    Vibegron (GEMTESA) 75 MG TABS, Take 75 mg by mouth daily., Disp: , Rfl:

## 2021-06-09 ENCOUNTER — Encounter: Payer: Self-pay | Admitting: Pulmonary Disease

## 2021-06-13 DIAGNOSIS — N3941 Urge incontinence: Secondary | ICD-10-CM | POA: Diagnosis not present

## 2021-06-15 DIAGNOSIS — D225 Melanocytic nevi of trunk: Secondary | ICD-10-CM | POA: Diagnosis not present

## 2021-06-15 DIAGNOSIS — D485 Neoplasm of uncertain behavior of skin: Secondary | ICD-10-CM | POA: Diagnosis not present

## 2021-06-15 DIAGNOSIS — D2272 Melanocytic nevi of left lower limb, including hip: Secondary | ICD-10-CM | POA: Diagnosis not present

## 2021-06-15 DIAGNOSIS — Z85828 Personal history of other malignant neoplasm of skin: Secondary | ICD-10-CM | POA: Diagnosis not present

## 2021-06-15 DIAGNOSIS — L570 Actinic keratosis: Secondary | ICD-10-CM | POA: Diagnosis not present

## 2021-06-15 DIAGNOSIS — D2262 Melanocytic nevi of left upper limb, including shoulder: Secondary | ICD-10-CM | POA: Diagnosis not present

## 2021-06-15 DIAGNOSIS — D044 Carcinoma in situ of skin of scalp and neck: Secondary | ICD-10-CM | POA: Diagnosis not present

## 2021-06-15 DIAGNOSIS — X32XXXA Exposure to sunlight, initial encounter: Secondary | ICD-10-CM | POA: Diagnosis not present

## 2021-06-20 DIAGNOSIS — N3941 Urge incontinence: Secondary | ICD-10-CM | POA: Diagnosis not present

## 2021-06-21 DIAGNOSIS — M5416 Radiculopathy, lumbar region: Secondary | ICD-10-CM | POA: Diagnosis not present

## 2021-06-21 DIAGNOSIS — M25551 Pain in right hip: Secondary | ICD-10-CM | POA: Diagnosis not present

## 2021-06-21 DIAGNOSIS — M25552 Pain in left hip: Secondary | ICD-10-CM | POA: Diagnosis not present

## 2021-06-27 DIAGNOSIS — N3941 Urge incontinence: Secondary | ICD-10-CM | POA: Diagnosis not present

## 2021-06-27 DIAGNOSIS — M47816 Spondylosis without myelopathy or radiculopathy, lumbar region: Secondary | ICD-10-CM | POA: Diagnosis not present

## 2021-06-29 DIAGNOSIS — D044 Carcinoma in situ of skin of scalp and neck: Secondary | ICD-10-CM | POA: Diagnosis not present

## 2021-06-30 DIAGNOSIS — M5416 Radiculopathy, lumbar region: Secondary | ICD-10-CM | POA: Diagnosis not present

## 2021-06-30 DIAGNOSIS — M25552 Pain in left hip: Secondary | ICD-10-CM | POA: Diagnosis not present

## 2021-06-30 DIAGNOSIS — M25551 Pain in right hip: Secondary | ICD-10-CM | POA: Diagnosis not present

## 2021-07-04 DIAGNOSIS — N200 Calculus of kidney: Secondary | ICD-10-CM | POA: Diagnosis not present

## 2021-07-04 DIAGNOSIS — N401 Enlarged prostate with lower urinary tract symptoms: Secondary | ICD-10-CM | POA: Diagnosis not present

## 2021-07-04 DIAGNOSIS — N3941 Urge incontinence: Secondary | ICD-10-CM | POA: Diagnosis not present

## 2021-07-04 DIAGNOSIS — R3121 Asymptomatic microscopic hematuria: Secondary | ICD-10-CM | POA: Diagnosis not present

## 2021-07-06 DIAGNOSIS — M25552 Pain in left hip: Secondary | ICD-10-CM | POA: Diagnosis not present

## 2021-07-06 DIAGNOSIS — M25551 Pain in right hip: Secondary | ICD-10-CM | POA: Diagnosis not present

## 2021-07-06 DIAGNOSIS — M5416 Radiculopathy, lumbar region: Secondary | ICD-10-CM | POA: Diagnosis not present

## 2021-07-11 DIAGNOSIS — N3941 Urge incontinence: Secondary | ICD-10-CM | POA: Diagnosis not present

## 2021-07-13 DIAGNOSIS — K2270 Barrett's esophagus without dysplasia: Secondary | ICD-10-CM | POA: Diagnosis not present

## 2021-07-13 DIAGNOSIS — Z79899 Other long term (current) drug therapy: Secondary | ICD-10-CM | POA: Diagnosis not present

## 2021-07-17 DIAGNOSIS — R200 Anesthesia of skin: Secondary | ICD-10-CM | POA: Diagnosis not present

## 2021-07-18 DIAGNOSIS — N3941 Urge incontinence: Secondary | ICD-10-CM | POA: Diagnosis not present

## 2021-07-25 DIAGNOSIS — N3941 Urge incontinence: Secondary | ICD-10-CM | POA: Diagnosis not present

## 2021-08-01 DIAGNOSIS — N3941 Urge incontinence: Secondary | ICD-10-CM | POA: Diagnosis not present

## 2021-08-08 DIAGNOSIS — N3941 Urge incontinence: Secondary | ICD-10-CM | POA: Diagnosis not present

## 2021-08-10 DIAGNOSIS — M5416 Radiculopathy, lumbar region: Secondary | ICD-10-CM | POA: Diagnosis not present

## 2021-08-10 DIAGNOSIS — M25551 Pain in right hip: Secondary | ICD-10-CM | POA: Diagnosis not present

## 2021-08-10 DIAGNOSIS — M25552 Pain in left hip: Secondary | ICD-10-CM | POA: Diagnosis not present

## 2021-08-22 DIAGNOSIS — H93293 Other abnormal auditory perceptions, bilateral: Secondary | ICD-10-CM | POA: Diagnosis not present

## 2021-08-22 DIAGNOSIS — H6122 Impacted cerumen, left ear: Secondary | ICD-10-CM | POA: Diagnosis not present

## 2021-08-31 ENCOUNTER — Ambulatory Visit: Payer: Medicare Other | Admitting: Neurology

## 2021-09-05 DIAGNOSIS — N3941 Urge incontinence: Secondary | ICD-10-CM | POA: Diagnosis not present

## 2021-09-11 DIAGNOSIS — L578 Other skin changes due to chronic exposure to nonionizing radiation: Secondary | ICD-10-CM | POA: Diagnosis not present

## 2021-09-11 DIAGNOSIS — Z7982 Long term (current) use of aspirin: Secondary | ICD-10-CM | POA: Diagnosis not present

## 2021-09-11 DIAGNOSIS — E0590 Thyrotoxicosis, unspecified without thyrotoxic crisis or storm: Secondary | ICD-10-CM | POA: Diagnosis not present

## 2021-09-11 DIAGNOSIS — L720 Epidermal cyst: Secondary | ICD-10-CM | POA: Diagnosis not present

## 2021-09-11 DIAGNOSIS — X32XXXA Exposure to sunlight, initial encounter: Secondary | ICD-10-CM | POA: Diagnosis not present

## 2021-09-11 DIAGNOSIS — I10 Essential (primary) hypertension: Secondary | ICD-10-CM | POA: Diagnosis not present

## 2021-09-11 DIAGNOSIS — L570 Actinic keratosis: Secondary | ICD-10-CM | POA: Diagnosis not present

## 2021-09-12 DIAGNOSIS — M5416 Radiculopathy, lumbar region: Secondary | ICD-10-CM | POA: Diagnosis not present

## 2021-09-12 DIAGNOSIS — G629 Polyneuropathy, unspecified: Secondary | ICD-10-CM | POA: Diagnosis not present

## 2021-09-18 DIAGNOSIS — R200 Anesthesia of skin: Secondary | ICD-10-CM | POA: Diagnosis not present

## 2021-09-19 DIAGNOSIS — M8580 Other specified disorders of bone density and structure, unspecified site: Secondary | ICD-10-CM | POA: Diagnosis not present

## 2021-09-19 DIAGNOSIS — D72819 Decreased white blood cell count, unspecified: Secondary | ICD-10-CM | POA: Diagnosis not present

## 2021-09-19 DIAGNOSIS — Z0000 Encounter for general adult medical examination without abnormal findings: Secondary | ICD-10-CM | POA: Diagnosis not present

## 2021-09-19 DIAGNOSIS — K219 Gastro-esophageal reflux disease without esophagitis: Secondary | ICD-10-CM | POA: Diagnosis not present

## 2021-09-19 DIAGNOSIS — I10 Essential (primary) hypertension: Secondary | ICD-10-CM | POA: Diagnosis not present

## 2021-09-19 DIAGNOSIS — J432 Centrilobular emphysema: Secondary | ICD-10-CM | POA: Diagnosis not present

## 2021-09-19 DIAGNOSIS — E0590 Thyrotoxicosis, unspecified without thyrotoxic crisis or storm: Secondary | ICD-10-CM | POA: Diagnosis not present

## 2021-09-19 DIAGNOSIS — I700 Atherosclerosis of aorta: Secondary | ICD-10-CM | POA: Diagnosis not present

## 2021-09-19 DIAGNOSIS — N401 Enlarged prostate with lower urinary tract symptoms: Secondary | ICD-10-CM | POA: Diagnosis not present

## 2021-10-02 DIAGNOSIS — L570 Actinic keratosis: Secondary | ICD-10-CM | POA: Diagnosis not present

## 2021-10-02 DIAGNOSIS — S0001XA Abrasion of scalp, initial encounter: Secondary | ICD-10-CM | POA: Diagnosis not present

## 2021-10-03 DIAGNOSIS — M8580 Other specified disorders of bone density and structure, unspecified site: Secondary | ICD-10-CM | POA: Diagnosis not present

## 2021-10-03 DIAGNOSIS — E0590 Thyrotoxicosis, unspecified without thyrotoxic crisis or storm: Secondary | ICD-10-CM | POA: Diagnosis not present

## 2021-10-03 DIAGNOSIS — N3941 Urge incontinence: Secondary | ICD-10-CM | POA: Diagnosis not present

## 2021-10-12 DIAGNOSIS — H903 Sensorineural hearing loss, bilateral: Secondary | ICD-10-CM | POA: Diagnosis not present

## 2021-10-19 DIAGNOSIS — M5416 Radiculopathy, lumbar region: Secondary | ICD-10-CM | POA: Diagnosis not present

## 2021-10-19 DIAGNOSIS — M25551 Pain in right hip: Secondary | ICD-10-CM | POA: Diagnosis not present

## 2021-10-19 DIAGNOSIS — M25552 Pain in left hip: Secondary | ICD-10-CM | POA: Diagnosis not present

## 2021-10-24 ENCOUNTER — Telehealth: Payer: Self-pay | Admitting: Pulmonary Disease

## 2021-10-24 NOTE — Telephone Encounter (Signed)
Called and left voicemail for patient to call office back in regards to the Symbicort inhaler.  ?

## 2021-10-24 NOTE — Telephone Encounter (Signed)
Patient is returning phone call. Patient phone number is [phone]. May leave detailed message on voicemail. ?

## 2021-10-25 NOTE — Telephone Encounter (Signed)
Attempted to call pt but unable to reach. Left detailed message for pt to have him call the office to have Symbicort inhaler further discussed. Nothing further needed. ?

## 2021-11-01 DIAGNOSIS — M25552 Pain in left hip: Secondary | ICD-10-CM | POA: Diagnosis not present

## 2021-11-01 DIAGNOSIS — M5416 Radiculopathy, lumbar region: Secondary | ICD-10-CM | POA: Diagnosis not present

## 2021-11-01 DIAGNOSIS — M25551 Pain in right hip: Secondary | ICD-10-CM | POA: Diagnosis not present

## 2021-11-14 DIAGNOSIS — N3941 Urge incontinence: Secondary | ICD-10-CM | POA: Diagnosis not present

## 2021-11-28 DIAGNOSIS — M25552 Pain in left hip: Secondary | ICD-10-CM | POA: Diagnosis not present

## 2021-11-28 DIAGNOSIS — M5416 Radiculopathy, lumbar region: Secondary | ICD-10-CM | POA: Diagnosis not present

## 2021-11-28 DIAGNOSIS — M25551 Pain in right hip: Secondary | ICD-10-CM | POA: Diagnosis not present

## 2021-11-30 DIAGNOSIS — M5416 Radiculopathy, lumbar region: Secondary | ICD-10-CM | POA: Diagnosis not present

## 2021-11-30 DIAGNOSIS — M25552 Pain in left hip: Secondary | ICD-10-CM | POA: Diagnosis not present

## 2021-11-30 DIAGNOSIS — M25551 Pain in right hip: Secondary | ICD-10-CM | POA: Diagnosis not present

## 2021-12-13 DIAGNOSIS — M5416 Radiculopathy, lumbar region: Secondary | ICD-10-CM | POA: Diagnosis not present

## 2021-12-13 DIAGNOSIS — M25551 Pain in right hip: Secondary | ICD-10-CM | POA: Diagnosis not present

## 2021-12-13 DIAGNOSIS — M25552 Pain in left hip: Secondary | ICD-10-CM | POA: Diagnosis not present

## 2021-12-14 DIAGNOSIS — D2261 Melanocytic nevi of right upper limb, including shoulder: Secondary | ICD-10-CM | POA: Diagnosis not present

## 2021-12-14 DIAGNOSIS — L821 Other seborrheic keratosis: Secondary | ICD-10-CM | POA: Diagnosis not present

## 2021-12-14 DIAGNOSIS — D2262 Melanocytic nevi of left upper limb, including shoulder: Secondary | ICD-10-CM | POA: Diagnosis not present

## 2021-12-14 DIAGNOSIS — D485 Neoplasm of uncertain behavior of skin: Secondary | ICD-10-CM | POA: Diagnosis not present

## 2021-12-14 DIAGNOSIS — C44329 Squamous cell carcinoma of skin of other parts of face: Secondary | ICD-10-CM | POA: Diagnosis not present

## 2021-12-14 DIAGNOSIS — D225 Melanocytic nevi of trunk: Secondary | ICD-10-CM | POA: Diagnosis not present

## 2021-12-14 DIAGNOSIS — D2272 Melanocytic nevi of left lower limb, including hip: Secondary | ICD-10-CM | POA: Diagnosis not present

## 2021-12-14 DIAGNOSIS — L570 Actinic keratosis: Secondary | ICD-10-CM | POA: Diagnosis not present

## 2021-12-14 DIAGNOSIS — C44619 Basal cell carcinoma of skin of left upper limb, including shoulder: Secondary | ICD-10-CM | POA: Diagnosis not present

## 2021-12-19 DIAGNOSIS — N3941 Urge incontinence: Secondary | ICD-10-CM | POA: Diagnosis not present

## 2021-12-26 DIAGNOSIS — M25552 Pain in left hip: Secondary | ICD-10-CM | POA: Diagnosis not present

## 2021-12-26 DIAGNOSIS — M25551 Pain in right hip: Secondary | ICD-10-CM | POA: Diagnosis not present

## 2021-12-26 DIAGNOSIS — M5416 Radiculopathy, lumbar region: Secondary | ICD-10-CM | POA: Diagnosis not present

## 2022-01-25 DIAGNOSIS — M25551 Pain in right hip: Secondary | ICD-10-CM | POA: Diagnosis not present

## 2022-01-25 DIAGNOSIS — M25552 Pain in left hip: Secondary | ICD-10-CM | POA: Diagnosis not present

## 2022-01-25 DIAGNOSIS — M5416 Radiculopathy, lumbar region: Secondary | ICD-10-CM | POA: Diagnosis not present

## 2022-01-31 DIAGNOSIS — M25551 Pain in right hip: Secondary | ICD-10-CM | POA: Diagnosis not present

## 2022-01-31 DIAGNOSIS — M25552 Pain in left hip: Secondary | ICD-10-CM | POA: Diagnosis not present

## 2022-01-31 DIAGNOSIS — M5416 Radiculopathy, lumbar region: Secondary | ICD-10-CM | POA: Diagnosis not present

## 2022-02-01 DIAGNOSIS — D0439 Carcinoma in situ of skin of other parts of face: Secondary | ICD-10-CM | POA: Diagnosis not present

## 2022-02-01 DIAGNOSIS — C44329 Squamous cell carcinoma of skin of other parts of face: Secondary | ICD-10-CM | POA: Diagnosis not present

## 2022-02-08 DIAGNOSIS — C4441 Basal cell carcinoma of skin of scalp and neck: Secondary | ICD-10-CM | POA: Diagnosis not present

## 2022-02-08 DIAGNOSIS — C44619 Basal cell carcinoma of skin of left upper limb, including shoulder: Secondary | ICD-10-CM | POA: Diagnosis not present

## 2022-02-13 DIAGNOSIS — M25552 Pain in left hip: Secondary | ICD-10-CM | POA: Diagnosis not present

## 2022-02-13 DIAGNOSIS — M5416 Radiculopathy, lumbar region: Secondary | ICD-10-CM | POA: Diagnosis not present

## 2022-02-13 DIAGNOSIS — M25551 Pain in right hip: Secondary | ICD-10-CM | POA: Diagnosis not present

## 2022-02-20 DIAGNOSIS — N3941 Urge incontinence: Secondary | ICD-10-CM | POA: Diagnosis not present

## 2022-03-10 DIAGNOSIS — I10 Essential (primary) hypertension: Secondary | ICD-10-CM | POA: Diagnosis not present

## 2022-03-10 DIAGNOSIS — M8580 Other specified disorders of bone density and structure, unspecified site: Secondary | ICD-10-CM | POA: Diagnosis not present

## 2022-03-10 DIAGNOSIS — N401 Enlarged prostate with lower urinary tract symptoms: Secondary | ICD-10-CM | POA: Diagnosis not present

## 2022-03-15 DIAGNOSIS — Z7982 Long term (current) use of aspirin: Secondary | ICD-10-CM | POA: Diagnosis not present

## 2022-03-15 DIAGNOSIS — E0590 Thyrotoxicosis, unspecified without thyrotoxic crisis or storm: Secondary | ICD-10-CM | POA: Diagnosis not present

## 2022-03-15 DIAGNOSIS — I10 Essential (primary) hypertension: Secondary | ICD-10-CM | POA: Diagnosis not present

## 2022-03-15 DIAGNOSIS — Z23 Encounter for immunization: Secondary | ICD-10-CM | POA: Diagnosis not present

## 2022-03-20 DIAGNOSIS — N3941 Urge incontinence: Secondary | ICD-10-CM | POA: Diagnosis not present

## 2022-04-12 DIAGNOSIS — M25551 Pain in right hip: Secondary | ICD-10-CM | POA: Diagnosis not present

## 2022-04-12 DIAGNOSIS — M5416 Radiculopathy, lumbar region: Secondary | ICD-10-CM | POA: Diagnosis not present

## 2022-04-12 DIAGNOSIS — M25552 Pain in left hip: Secondary | ICD-10-CM | POA: Diagnosis not present

## 2022-04-17 DIAGNOSIS — N3941 Urge incontinence: Secondary | ICD-10-CM | POA: Diagnosis not present

## 2022-04-24 DIAGNOSIS — M8589 Other specified disorders of bone density and structure, multiple sites: Secondary | ICD-10-CM | POA: Diagnosis not present

## 2022-04-24 DIAGNOSIS — M85852 Other specified disorders of bone density and structure, left thigh: Secondary | ICD-10-CM | POA: Diagnosis not present

## 2022-04-24 DIAGNOSIS — Z23 Encounter for immunization: Secondary | ICD-10-CM | POA: Diagnosis not present

## 2022-04-24 DIAGNOSIS — T148XXA Other injury of unspecified body region, initial encounter: Secondary | ICD-10-CM | POA: Diagnosis not present

## 2022-05-01 DIAGNOSIS — H40033 Anatomical narrow angle, bilateral: Secondary | ICD-10-CM | POA: Diagnosis not present

## 2022-05-01 DIAGNOSIS — H5000 Unspecified esotropia: Secondary | ICD-10-CM | POA: Diagnosis not present

## 2022-05-01 DIAGNOSIS — D3132 Benign neoplasm of left choroid: Secondary | ICD-10-CM | POA: Diagnosis not present

## 2022-05-01 DIAGNOSIS — Z961 Presence of intraocular lens: Secondary | ICD-10-CM | POA: Diagnosis not present

## 2022-05-07 DIAGNOSIS — N401 Enlarged prostate with lower urinary tract symptoms: Secondary | ICD-10-CM | POA: Diagnosis not present

## 2022-05-07 DIAGNOSIS — N3941 Urge incontinence: Secondary | ICD-10-CM | POA: Diagnosis not present

## 2022-05-07 DIAGNOSIS — R3914 Feeling of incomplete bladder emptying: Secondary | ICD-10-CM | POA: Diagnosis not present

## 2022-05-08 DIAGNOSIS — M25552 Pain in left hip: Secondary | ICD-10-CM | POA: Diagnosis not present

## 2022-05-08 DIAGNOSIS — M25551 Pain in right hip: Secondary | ICD-10-CM | POA: Diagnosis not present

## 2022-05-08 DIAGNOSIS — M5416 Radiculopathy, lumbar region: Secondary | ICD-10-CM | POA: Diagnosis not present

## 2022-05-09 DIAGNOSIS — M25512 Pain in left shoulder: Secondary | ICD-10-CM | POA: Diagnosis not present

## 2022-05-21 DIAGNOSIS — M5416 Radiculopathy, lumbar region: Secondary | ICD-10-CM | POA: Diagnosis not present

## 2022-05-21 DIAGNOSIS — M25551 Pain in right hip: Secondary | ICD-10-CM | POA: Diagnosis not present

## 2022-05-21 DIAGNOSIS — M25552 Pain in left hip: Secondary | ICD-10-CM | POA: Diagnosis not present

## 2022-06-07 DIAGNOSIS — M5416 Radiculopathy, lumbar region: Secondary | ICD-10-CM | POA: Diagnosis not present

## 2022-06-07 DIAGNOSIS — M25551 Pain in right hip: Secondary | ICD-10-CM | POA: Diagnosis not present

## 2022-06-07 DIAGNOSIS — M25552 Pain in left hip: Secondary | ICD-10-CM | POA: Diagnosis not present

## 2022-06-21 DIAGNOSIS — L821 Other seborrheic keratosis: Secondary | ICD-10-CM | POA: Diagnosis not present

## 2022-06-21 DIAGNOSIS — D225 Melanocytic nevi of trunk: Secondary | ICD-10-CM | POA: Diagnosis not present

## 2022-06-21 DIAGNOSIS — L570 Actinic keratosis: Secondary | ICD-10-CM | POA: Diagnosis not present

## 2022-06-21 DIAGNOSIS — M25551 Pain in right hip: Secondary | ICD-10-CM | POA: Diagnosis not present

## 2022-06-21 DIAGNOSIS — X32XXXA Exposure to sunlight, initial encounter: Secondary | ICD-10-CM | POA: Diagnosis not present

## 2022-06-21 DIAGNOSIS — D2271 Melanocytic nevi of right lower limb, including hip: Secondary | ICD-10-CM | POA: Diagnosis not present

## 2022-06-21 DIAGNOSIS — D2262 Melanocytic nevi of left upper limb, including shoulder: Secondary | ICD-10-CM | POA: Diagnosis not present

## 2022-06-21 DIAGNOSIS — M25552 Pain in left hip: Secondary | ICD-10-CM | POA: Diagnosis not present

## 2022-06-21 DIAGNOSIS — D485 Neoplasm of uncertain behavior of skin: Secondary | ICD-10-CM | POA: Diagnosis not present

## 2022-06-21 DIAGNOSIS — D2261 Melanocytic nevi of right upper limb, including shoulder: Secondary | ICD-10-CM | POA: Diagnosis not present

## 2022-06-21 DIAGNOSIS — D0439 Carcinoma in situ of skin of other parts of face: Secondary | ICD-10-CM | POA: Diagnosis not present

## 2022-06-21 DIAGNOSIS — M5416 Radiculopathy, lumbar region: Secondary | ICD-10-CM | POA: Diagnosis not present

## 2022-06-21 DIAGNOSIS — D2272 Melanocytic nevi of left lower limb, including hip: Secondary | ICD-10-CM | POA: Diagnosis not present

## 2022-07-05 DIAGNOSIS — N401 Enlarged prostate with lower urinary tract symptoms: Secondary | ICD-10-CM | POA: Diagnosis not present

## 2022-07-05 DIAGNOSIS — M5416 Radiculopathy, lumbar region: Secondary | ICD-10-CM | POA: Diagnosis not present

## 2022-07-05 DIAGNOSIS — R351 Nocturia: Secondary | ICD-10-CM | POA: Diagnosis not present

## 2022-07-05 DIAGNOSIS — N3941 Urge incontinence: Secondary | ICD-10-CM | POA: Diagnosis not present

## 2022-07-05 DIAGNOSIS — M25552 Pain in left hip: Secondary | ICD-10-CM | POA: Diagnosis not present

## 2022-07-05 DIAGNOSIS — M25551 Pain in right hip: Secondary | ICD-10-CM | POA: Diagnosis not present

## 2022-07-10 DIAGNOSIS — M47816 Spondylosis without myelopathy or radiculopathy, lumbar region: Secondary | ICD-10-CM | POA: Diagnosis not present

## 2022-07-16 DIAGNOSIS — D0439 Carcinoma in situ of skin of other parts of face: Secondary | ICD-10-CM | POA: Diagnosis not present

## 2022-07-18 DIAGNOSIS — M47816 Spondylosis without myelopathy or radiculopathy, lumbar region: Secondary | ICD-10-CM | POA: Diagnosis not present

## 2022-07-19 DIAGNOSIS — M5416 Radiculopathy, lumbar region: Secondary | ICD-10-CM | POA: Diagnosis not present

## 2022-07-19 DIAGNOSIS — M25552 Pain in left hip: Secondary | ICD-10-CM | POA: Diagnosis not present

## 2022-07-19 DIAGNOSIS — M25551 Pain in right hip: Secondary | ICD-10-CM | POA: Diagnosis not present

## 2022-07-20 ENCOUNTER — Telehealth: Payer: Self-pay | Admitting: Cardiology

## 2022-07-20 NOTE — Telephone Encounter (Signed)
Patient called stating he would like to pick Dr. Radford Pax as his new cardiologist. At this time he does not need an appt, he just wanted to have one on record.

## 2022-08-02 DIAGNOSIS — M25551 Pain in right hip: Secondary | ICD-10-CM | POA: Diagnosis not present

## 2022-08-02 DIAGNOSIS — M5416 Radiculopathy, lumbar region: Secondary | ICD-10-CM | POA: Diagnosis not present

## 2022-08-02 DIAGNOSIS — M25552 Pain in left hip: Secondary | ICD-10-CM | POA: Diagnosis not present

## 2022-08-13 DIAGNOSIS — M47816 Spondylosis without myelopathy or radiculopathy, lumbar region: Secondary | ICD-10-CM | POA: Diagnosis not present

## 2022-08-20 DIAGNOSIS — M25552 Pain in left hip: Secondary | ICD-10-CM | POA: Diagnosis not present

## 2022-08-20 DIAGNOSIS — M25551 Pain in right hip: Secondary | ICD-10-CM | POA: Diagnosis not present

## 2022-08-20 DIAGNOSIS — M5416 Radiculopathy, lumbar region: Secondary | ICD-10-CM | POA: Diagnosis not present

## 2022-08-28 DIAGNOSIS — M47816 Spondylosis without myelopathy or radiculopathy, lumbar region: Secondary | ICD-10-CM | POA: Diagnosis not present

## 2022-09-04 DIAGNOSIS — M5416 Radiculopathy, lumbar region: Secondary | ICD-10-CM | POA: Diagnosis not present

## 2022-09-04 DIAGNOSIS — M25551 Pain in right hip: Secondary | ICD-10-CM | POA: Diagnosis not present

## 2022-09-04 DIAGNOSIS — M25552 Pain in left hip: Secondary | ICD-10-CM | POA: Diagnosis not present

## 2022-09-06 DIAGNOSIS — M25552 Pain in left hip: Secondary | ICD-10-CM | POA: Diagnosis not present

## 2022-09-06 DIAGNOSIS — M5416 Radiculopathy, lumbar region: Secondary | ICD-10-CM | POA: Diagnosis not present

## 2022-09-06 DIAGNOSIS — M25551 Pain in right hip: Secondary | ICD-10-CM | POA: Diagnosis not present

## 2022-09-20 DIAGNOSIS — M25552 Pain in left hip: Secondary | ICD-10-CM | POA: Diagnosis not present

## 2022-09-20 DIAGNOSIS — Z0000 Encounter for general adult medical examination without abnormal findings: Secondary | ICD-10-CM | POA: Diagnosis not present

## 2022-09-20 DIAGNOSIS — E0590 Thyrotoxicosis, unspecified without thyrotoxic crisis or storm: Secondary | ICD-10-CM | POA: Diagnosis not present

## 2022-09-20 DIAGNOSIS — M5416 Radiculopathy, lumbar region: Secondary | ICD-10-CM | POA: Diagnosis not present

## 2022-09-20 DIAGNOSIS — M25551 Pain in right hip: Secondary | ICD-10-CM | POA: Diagnosis not present

## 2022-09-20 DIAGNOSIS — E559 Vitamin D deficiency, unspecified: Secondary | ICD-10-CM | POA: Diagnosis not present

## 2022-09-24 DIAGNOSIS — R351 Nocturia: Secondary | ICD-10-CM | POA: Diagnosis not present

## 2022-09-24 DIAGNOSIS — M47816 Spondylosis without myelopathy or radiculopathy, lumbar region: Secondary | ICD-10-CM | POA: Diagnosis not present

## 2022-09-24 DIAGNOSIS — N401 Enlarged prostate with lower urinary tract symptoms: Secondary | ICD-10-CM | POA: Diagnosis not present

## 2022-09-24 DIAGNOSIS — M533 Sacrococcygeal disorders, not elsewhere classified: Secondary | ICD-10-CM | POA: Diagnosis not present

## 2022-09-24 DIAGNOSIS — N3941 Urge incontinence: Secondary | ICD-10-CM | POA: Diagnosis not present

## 2022-09-25 DIAGNOSIS — E0590 Thyrotoxicosis, unspecified without thyrotoxic crisis or storm: Secondary | ICD-10-CM | POA: Diagnosis not present

## 2022-09-25 DIAGNOSIS — I700 Atherosclerosis of aorta: Secondary | ICD-10-CM | POA: Diagnosis not present

## 2022-09-25 DIAGNOSIS — N401 Enlarged prostate with lower urinary tract symptoms: Secondary | ICD-10-CM | POA: Diagnosis not present

## 2022-09-25 DIAGNOSIS — N200 Calculus of kidney: Secondary | ICD-10-CM | POA: Diagnosis not present

## 2022-09-25 DIAGNOSIS — J432 Centrilobular emphysema: Secondary | ICD-10-CM | POA: Diagnosis not present

## 2022-09-25 DIAGNOSIS — J45909 Unspecified asthma, uncomplicated: Secondary | ICD-10-CM | POA: Diagnosis not present

## 2022-09-25 DIAGNOSIS — M8580 Other specified disorders of bone density and structure, unspecified site: Secondary | ICD-10-CM | POA: Diagnosis not present

## 2022-09-25 DIAGNOSIS — Z23 Encounter for immunization: Secondary | ICD-10-CM | POA: Diagnosis not present

## 2022-09-25 DIAGNOSIS — Z0000 Encounter for general adult medical examination without abnormal findings: Secondary | ICD-10-CM | POA: Diagnosis not present

## 2022-09-25 DIAGNOSIS — K219 Gastro-esophageal reflux disease without esophagitis: Secondary | ICD-10-CM | POA: Diagnosis not present

## 2022-09-25 DIAGNOSIS — I10 Essential (primary) hypertension: Secondary | ICD-10-CM | POA: Diagnosis not present

## 2022-10-03 DIAGNOSIS — Z961 Presence of intraocular lens: Secondary | ICD-10-CM | POA: Diagnosis not present

## 2022-10-09 DIAGNOSIS — M533 Sacrococcygeal disorders, not elsewhere classified: Secondary | ICD-10-CM | POA: Diagnosis not present

## 2022-10-15 DIAGNOSIS — R3914 Feeling of incomplete bladder emptying: Secondary | ICD-10-CM | POA: Diagnosis not present

## 2022-10-15 DIAGNOSIS — M25551 Pain in right hip: Secondary | ICD-10-CM | POA: Diagnosis not present

## 2022-10-15 DIAGNOSIS — M25552 Pain in left hip: Secondary | ICD-10-CM | POA: Diagnosis not present

## 2022-10-15 DIAGNOSIS — M5416 Radiculopathy, lumbar region: Secondary | ICD-10-CM | POA: Diagnosis not present

## 2022-10-17 DIAGNOSIS — R351 Nocturia: Secondary | ICD-10-CM | POA: Diagnosis not present

## 2022-10-17 DIAGNOSIS — N401 Enlarged prostate with lower urinary tract symptoms: Secondary | ICD-10-CM | POA: Diagnosis not present

## 2022-10-17 DIAGNOSIS — N3941 Urge incontinence: Secondary | ICD-10-CM | POA: Diagnosis not present

## 2022-10-30 DIAGNOSIS — M5416 Radiculopathy, lumbar region: Secondary | ICD-10-CM | POA: Diagnosis not present

## 2022-10-30 DIAGNOSIS — M25552 Pain in left hip: Secondary | ICD-10-CM | POA: Diagnosis not present

## 2022-10-30 DIAGNOSIS — M25551 Pain in right hip: Secondary | ICD-10-CM | POA: Diagnosis not present

## 2022-11-12 DIAGNOSIS — M19072 Primary osteoarthritis, left ankle and foot: Secondary | ICD-10-CM | POA: Diagnosis not present

## 2022-11-12 DIAGNOSIS — M79675 Pain in left toe(s): Secondary | ICD-10-CM | POA: Diagnosis not present

## 2022-11-15 DIAGNOSIS — M47816 Spondylosis without myelopathy or radiculopathy, lumbar region: Secondary | ICD-10-CM | POA: Diagnosis not present

## 2022-11-20 DIAGNOSIS — M25551 Pain in right hip: Secondary | ICD-10-CM | POA: Diagnosis not present

## 2022-11-20 DIAGNOSIS — M25552 Pain in left hip: Secondary | ICD-10-CM | POA: Diagnosis not present

## 2022-11-20 DIAGNOSIS — M5416 Radiculopathy, lumbar region: Secondary | ICD-10-CM | POA: Diagnosis not present

## 2022-11-29 DIAGNOSIS — M47816 Spondylosis without myelopathy or radiculopathy, lumbar region: Secondary | ICD-10-CM | POA: Diagnosis not present

## 2022-12-11 DIAGNOSIS — M25551 Pain in right hip: Secondary | ICD-10-CM | POA: Diagnosis not present

## 2022-12-11 DIAGNOSIS — M47816 Spondylosis without myelopathy or radiculopathy, lumbar region: Secondary | ICD-10-CM | POA: Diagnosis not present

## 2022-12-11 DIAGNOSIS — M5416 Radiculopathy, lumbar region: Secondary | ICD-10-CM | POA: Diagnosis not present

## 2022-12-11 DIAGNOSIS — M25552 Pain in left hip: Secondary | ICD-10-CM | POA: Diagnosis not present

## 2022-12-21 DIAGNOSIS — M19072 Primary osteoarthritis, left ankle and foot: Secondary | ICD-10-CM | POA: Diagnosis not present

## 2022-12-27 DIAGNOSIS — D044 Carcinoma in situ of skin of scalp and neck: Secondary | ICD-10-CM | POA: Diagnosis not present

## 2022-12-27 DIAGNOSIS — L905 Scar conditions and fibrosis of skin: Secondary | ICD-10-CM | POA: Diagnosis not present

## 2022-12-27 DIAGNOSIS — D2262 Melanocytic nevi of left upper limb, including shoulder: Secondary | ICD-10-CM | POA: Diagnosis not present

## 2022-12-27 DIAGNOSIS — L570 Actinic keratosis: Secondary | ICD-10-CM | POA: Diagnosis not present

## 2022-12-27 DIAGNOSIS — L821 Other seborrheic keratosis: Secondary | ICD-10-CM | POA: Diagnosis not present

## 2022-12-27 DIAGNOSIS — D225 Melanocytic nevi of trunk: Secondary | ICD-10-CM | POA: Diagnosis not present

## 2022-12-27 DIAGNOSIS — D0439 Carcinoma in situ of skin of other parts of face: Secondary | ICD-10-CM | POA: Diagnosis not present

## 2022-12-27 DIAGNOSIS — D2271 Melanocytic nevi of right lower limb, including hip: Secondary | ICD-10-CM | POA: Diagnosis not present

## 2022-12-27 DIAGNOSIS — D2272 Melanocytic nevi of left lower limb, including hip: Secondary | ICD-10-CM | POA: Diagnosis not present

## 2022-12-27 DIAGNOSIS — D485 Neoplasm of uncertain behavior of skin: Secondary | ICD-10-CM | POA: Diagnosis not present

## 2022-12-27 DIAGNOSIS — D2261 Melanocytic nevi of right upper limb, including shoulder: Secondary | ICD-10-CM | POA: Diagnosis not present

## 2022-12-29 ENCOUNTER — Other Ambulatory Visit: Payer: Self-pay | Admitting: Pulmonary Disease

## 2022-12-29 DIAGNOSIS — R053 Chronic cough: Secondary | ICD-10-CM

## 2023-01-01 DIAGNOSIS — M25551 Pain in right hip: Secondary | ICD-10-CM | POA: Diagnosis not present

## 2023-01-01 DIAGNOSIS — M5416 Radiculopathy, lumbar region: Secondary | ICD-10-CM | POA: Diagnosis not present

## 2023-01-01 DIAGNOSIS — M25552 Pain in left hip: Secondary | ICD-10-CM | POA: Diagnosis not present

## 2023-01-01 DIAGNOSIS — M47816 Spondylosis without myelopathy or radiculopathy, lumbar region: Secondary | ICD-10-CM | POA: Diagnosis not present

## 2023-01-03 DIAGNOSIS — D0439 Carcinoma in situ of skin of other parts of face: Secondary | ICD-10-CM | POA: Diagnosis not present

## 2023-01-15 DIAGNOSIS — M25551 Pain in right hip: Secondary | ICD-10-CM | POA: Diagnosis not present

## 2023-01-15 DIAGNOSIS — M25552 Pain in left hip: Secondary | ICD-10-CM | POA: Diagnosis not present

## 2023-01-15 DIAGNOSIS — M5416 Radiculopathy, lumbar region: Secondary | ICD-10-CM | POA: Diagnosis not present

## 2023-01-21 ENCOUNTER — Other Ambulatory Visit: Payer: Self-pay

## 2023-01-21 ENCOUNTER — Emergency Department (HOSPITAL_COMMUNITY)
Admission: EM | Admit: 2023-01-21 | Discharge: 2023-01-21 | Disposition: A | Discharge status: Home / Self Care | Payer: Medicare Other | Attending: Emergency Medicine | Admitting: Emergency Medicine

## 2023-01-21 DIAGNOSIS — Z7982 Long term (current) use of aspirin: Secondary | ICD-10-CM | POA: Diagnosis not present

## 2023-01-21 DIAGNOSIS — R231 Pallor: Secondary | ICD-10-CM | POA: Diagnosis not present

## 2023-01-21 DIAGNOSIS — Y9353 Activity, golf: Secondary | ICD-10-CM | POA: Insufficient documentation

## 2023-01-21 DIAGNOSIS — G4489 Other headache syndrome: Secondary | ICD-10-CM | POA: Diagnosis not present

## 2023-01-21 DIAGNOSIS — R55 Syncope and collapse: Secondary | ICD-10-CM | POA: Insufficient documentation

## 2023-01-21 DIAGNOSIS — T675XXA Heat exhaustion, unspecified, initial encounter: Secondary | ICD-10-CM | POA: Insufficient documentation

## 2023-01-21 DIAGNOSIS — I499 Cardiac arrhythmia, unspecified: Secondary | ICD-10-CM | POA: Diagnosis not present

## 2023-01-21 DIAGNOSIS — Y9239 Other specified sports and athletic area as the place of occurrence of the external cause: Secondary | ICD-10-CM | POA: Insufficient documentation

## 2023-01-21 DIAGNOSIS — R410 Disorientation, unspecified: Secondary | ICD-10-CM | POA: Diagnosis not present

## 2023-01-21 DIAGNOSIS — X30XXXA Exposure to excessive natural heat, initial encounter: Secondary | ICD-10-CM | POA: Diagnosis not present

## 2023-01-21 LAB — BASIC METABOLIC PANEL
Anion gap: 11 (ref 5–15)
BUN: 23 mg/dL (ref 8–23)
CO2: 27 mmol/L (ref 22–32)
Calcium: 9.8 mg/dL (ref 8.9–10.3)
Chloride: 101 mmol/L (ref 98–111)
Creatinine, Ser: 1.88 mg/dL — ABNORMAL HIGH (ref 0.61–1.24)
GFR, Estimated: 35 mL/min — ABNORMAL LOW (ref >60)
Glucose, Bld: 85 mg/dL (ref 70–99)
Potassium: 4.2 mmol/L (ref 3.5–5.1)
Sodium: 139 mmol/L (ref 135–145)

## 2023-01-21 LAB — TROPONIN I (HIGH SENSITIVITY)
Troponin I (High Sensitivity): 11 ng/L (ref <18)
Troponin I (High Sensitivity): 10 ng/L (ref <18)

## 2023-01-21 LAB — CBC
HCT: 45.2 % (ref 39.0–52.0)
Hemoglobin: 15.4 g/dL (ref 13.0–17.0)
MCH: 32.8 pg (ref 26.0–34.0)
MCHC: 34.1 g/dL (ref 30.0–36.0)
MCV: 96.2 fL (ref 80.0–100.0)
Platelets: 225 10*3/uL (ref 150–400)
RBC: 4.7 MIL/uL (ref 4.22–5.81)
RDW: 13.1 % (ref 11.5–15.5)
WBC: 10.8 10*3/uL — ABNORMAL HIGH (ref 4.0–10.5)
nRBC: 0 % (ref 0.0–0.2)

## 2023-01-21 NOTE — ED Provider Triage Note (Signed)
Emergency Medicine Provider Triage Evaluation Note  [name] , a 87 y.o. male  was evaluated in triage.  Pt complains of near syncope. States that he walked 18 holes of golf today and on his way back to the clubhouse he started to get hot and sweaty. States that he felt like he might pass out, made it to a chair in the clubhouse and sat down. Apparently he was pale and diaphoretic and barely responsive and so EMS was called. He was responsive when EMS arrived, hypotension originally, improved with 100 cc fluids. Currently patient is at his baseline and is asymptomatic. Denies any chest pain or shortness of breath.  Review of Systems  Positive:  Negative:   Physical Exam  BP (!) 146/95   Pulse (!) 102   Temp 97.7 F (36.5 C)   Resp 17   SpO2 98%  Gen:   Awake, no distress, well appearing Resp:  Normal effort  MSK:   Moves extremities without difficulty  Other:    Medical Decision Making  Medically screening exam initiated at 3:21 PM.  Appropriate orders placed.  [name] was informed that the remainder of the evaluation will be completed by another provider, this initial triage assessment does not replace that evaluation, and the importance of remaining in the ED until their evaluation is complete.     Thomas Bandy, PA-C 01/21/23 1527

## 2023-01-21 NOTE — ED Provider Notes (Signed)
[place] EMERGENCY DEPARTMENT AT [facility] Provider Note   CSN: [id number] Arrival date & time: 01/21/23  1450     History  Chief Complaint  Patient presents with   Near Syncope    [name] is a 87 y.o. male presenting to ED with complaint of near syncope.  The patient reports he is extremely active, he is a lifelong runner, and he was walking in 18-hole golf course today, playing golf.  He says it became increasingly hot and muggy outside.  He was trying to get hydrated by the time he finished and went to the clubhouse he was feeling very "rundown".  He reports he had some mild lightheadedness.  He was laid down on the floor, had a leg raises performed and was given Gatorade.  He said he felt hot and sweaty at the time.  He denies any chest pain or diaphoresis.  EMS was called onto the scene and noted that the patient was "hypotensive" although low initial blood pressure was not reported.  Patient was given 500 cc of fluid.  Since coming into the ED the patient feels back to normal.  He has been in the emergency department approximately 7 hours since triage.  Patient has been seen by cardiology in the past and had a coronary CT performed in March 2022 with a calcium score of 4.  His cardiologist reported this was excellent and had no concern for significant coronary disease.    The patient denies to me history or issues with ongoing angina, lightheadedness, DVT, PE, or other blood clots.  He reports he had 1 episode of syncope in the past many years ago and that was due to severe back spasms.   HPI     Home Medications Prior to Admission medications   Medication Sig Start Date End Date Taking? Authorizing Provider  ascorbic acid (VITAMIN C) 1000 MG tablet Take 1,000 mg by mouth daily.    [provider]  aspirin 81 MG EC tablet Take 81 mg by mouth daily.    [provider]  budesonide-formoterol (SYMBICORT) 160-4.5 MCG/ACT inhaler Inhale 2 puffs  into the lungs 2 (two) times daily. 05/10/21   Martina Sinner, MD  Chlorphen-PE-Acetaminophen (NOREL AD) 4-10-325 MG TABS 1 tablet 04/26/21   [provider]  Cholecalciferol (VITAMIN D3) 25 MCG (1000 UT) CAPS Take 1 capsule by mouth daily.    [provider]  hydrochlorothiazide (HYDRODIURIL) 25 MG tablet Take 25 mg by mouth daily.    [provider]  methimazole (TAPAZOLE) 5 MG tablet Take 5 mg by mouth daily. 10/17/20   [provider]  Multiple Vitamin (MULTI-VITAMIN) tablet Take 1 tablet by mouth daily.    [provider]  niacin 500 MG tablet Take 500 mg by mouth at bedtime.    [provider]  omeprazole (PRILOSEC) 40 MG capsule Take 40 mg by mouth daily.    [provider]  Vibegron (GEMTESA) 75 MG TABS Take 75 mg by mouth daily.    [provider]      Allergies    Codeine and Penicillins    Review of Systems   Review of Systems  Physical Exam Updated Vital Signs BP (!) 149/75   Pulse 93   Temp 98 F (36.7 C) (Oral)   Resp 17   SpO2 97%  Physical Exam Constitutional:      General: He is not in acute distress. HENT:     Head: Normocephalic and  atraumatic.  Eyes:     Conjunctiva/sclera: Conjunctivae normal.     Pupils: Pupils are equal, round, and reactive to light.  Cardiovascular:     Rate and Rhythm: Normal rate and regular rhythm.  Pulmonary:     Effort: Pulmonary effort is normal. No respiratory distress.  Abdominal:     General: There is no distension.     Tenderness: There is no abdominal tenderness.  Skin:    General: Skin is warm and dry.  Neurological:     General: No focal deficit present.     Mental Status: He is alert. Mental status is at baseline.  Psychiatric:        Mood and Affect: Mood normal.        Behavior: Behavior normal.     ED Results / Procedures / Treatments   Labs (all labs ordered are listed, but only abnormal results are displayed) Labs Reviewed  CBC -  Abnormal; Notable for the following components:      Result Value   WBC 10.8 (*)    All other components within normal limits  BASIC METABOLIC PANEL - Abnormal; Notable for the following components:   Creatinine, Ser 1.88 (*)    GFR, Estimated 35 (*)    All other components within normal limits  TROPONIN I (HIGH SENSITIVITY)  TROPONIN I (HIGH SENSITIVITY)    EKG EKG Interpretation Date/Time:  Monday January 21 2023 15:06:38 EDT Ventricular Rate:  99 PR Interval:  188 QRS Duration:  86 QT Interval:  362 QTC Calculation: 464 R Axis:   27  Text Interpretation: Sinus rhythm with frequent Premature ventricular complexes and Premature atrial complexes Otherwise normal ECG No previous ECGs available Confirmed by Alvester Chou [phone]) on 01/21/2023 9:16:42 PM  Radiology No results found.  Procedures Procedures    Medications Ordered in ED Medications - No data to display  ED Course/ Medical Decision Making/ A&P                                 Medical Decision Making  This patient presents to the ED with concern for near syncope. This involves an extensive number of treatment options, and is a complaint that carries with it a high risk of complications and morbidity.  The differential diagnosis includes heat exhaustion versus dehydration versus arrhythmia versus atypical ACS versus other  Co-morbidities that complicate the patient evaluation: Age is a risk factor for cardiovascular disease  External records from outside source obtained and reviewed including coronary CT  I ordered and personally interpreted labs.  The pertinent results include: That the troponin is negative.  BMP and CBC largely unremarkable.  Creatinine is mildly elevated at 1.88 today.  Per my interpretation the patient's ECG shows sinus rhythm with occasional PVC  I have reviewed the patients home medicines and have made adjustments as needed  Test Considered: Patient's lungs are clear to auscultation.   He has no signs or symptoms of pneumonia, pneumothorax, and very low suspicion for PE.  No emergent indication for cardiothoracic imaging.  Doubt aortic dissection.   After the interventions noted above, I reevaluated the patient and found that they have: improved  Social Determinants of Health: patient strongly encouraged to stay hydrated, avoid excessive heat, particularly with exertion outdoors.  With negative delta troponins and normal and unremarkable coronary calcium score only 2 years ago on cardiac imaging, I have a low suspicion that this is ACS.  Dispostion:  After consideration of the diagnostic results and the patients response to treatment, I feel that the patent would benefit from close outpatient follow-up.  At this time I do think he is stable for discharge.  Hospitalization was considered but the patient and his wife have a preference to go home and this is reasonable.  I will refer him back to cardiology as his former cardiologist had retired.  I wonder whether he may benefit from an echocardiogram or a ZIO monitor.  In the past his ZIO monitoring has shown a short run of V Tach, which we discussed here in the ED, but at this time I have low suspicion for ongoing high-grade arrhythmia.  Referral placed to cardiology.  His wife is taking him home         Final Clinical Impression(s) / ED Diagnoses Final diagnoses:  Near syncope  Heat exhaustion, initial encounter    Rx / DC Orders ED Discharge Orders     None         Finnis Colee, Kermit Balo, MD 01/21/23 2147

## 2023-01-21 NOTE — ED Triage Notes (Signed)
Patient BIB [facility]  after getting overheated and upon entering the clubhouse patient became pale, diaphoretic and almost unresponsive. Bystanders sat the patient down and he recovered. Initial BP was 74 palpated and EMS reports multifocal, non-perfusing PVCs. EMS administered of fluid, last BP 126/80. HR 86, no orthostatic changes, A&Ox4.

## 2023-01-24 DIAGNOSIS — M25512 Pain in left shoulder: Secondary | ICD-10-CM | POA: Diagnosis not present

## 2023-01-28 ENCOUNTER — Telehealth: Payer: Self-pay

## 2023-01-28 DIAGNOSIS — C44329 Squamous cell carcinoma of skin of other parts of face: Secondary | ICD-10-CM | POA: Diagnosis not present

## 2023-01-28 NOTE — Telephone Encounter (Signed)
Transition Care Management Unsuccessful Follow-up Telephone Call  Date of discharge and from where:  01/21/2023 [facility]  Attempts:  2nd Attempt  Reason for unsuccessful TCM follow-up call:  Left voice message  Lougenia Morrissey [facility]  [facility] Population Health Community Resource Care Guide   [email or web address]  ?? [phone]   Website: [email or web address]

## 2023-01-28 NOTE — Telephone Encounter (Signed)
Transition Care Management Unsuccessful Follow-up Telephone Call  Date of discharge and from where:  01/21/2023 [facility]  Attempts:  1st Attempt  Reason for unsuccessful TCM follow-up call:  Left voice message  [name]  [facility] Population Health Community Resource Care Guide   [email or web address]  ?? [phone]   Website: [email or web address]  [email or web address]

## 2023-01-31 DIAGNOSIS — M25552 Pain in left hip: Secondary | ICD-10-CM | POA: Diagnosis not present

## 2023-01-31 DIAGNOSIS — M5416 Radiculopathy, lumbar region: Secondary | ICD-10-CM | POA: Diagnosis not present

## 2023-01-31 DIAGNOSIS — M25551 Pain in right hip: Secondary | ICD-10-CM | POA: Diagnosis not present

## 2023-02-06 DIAGNOSIS — M25552 Pain in left hip: Secondary | ICD-10-CM | POA: Diagnosis not present

## 2023-02-06 DIAGNOSIS — M25551 Pain in right hip: Secondary | ICD-10-CM | POA: Diagnosis not present

## 2023-02-06 DIAGNOSIS — M5416 Radiculopathy, lumbar region: Secondary | ICD-10-CM | POA: Diagnosis not present

## 2023-02-12 DIAGNOSIS — M25552 Pain in left hip: Secondary | ICD-10-CM | POA: Diagnosis not present

## 2023-02-12 DIAGNOSIS — M5416 Radiculopathy, lumbar region: Secondary | ICD-10-CM | POA: Diagnosis not present

## 2023-02-12 DIAGNOSIS — M25551 Pain in right hip: Secondary | ICD-10-CM | POA: Diagnosis not present

## 2023-02-12 IMAGING — DX DG CHEST 2V
2 series · 2 of 2 positions shown · non-contrast
Comparison: Chest radiograph, 07/01/2010. CT cardiac calcium
scoring, 08/11/2020.

CLINICAL DATA: Cough.

EXAM:
CHEST - 2 VIEW

[chest pa]
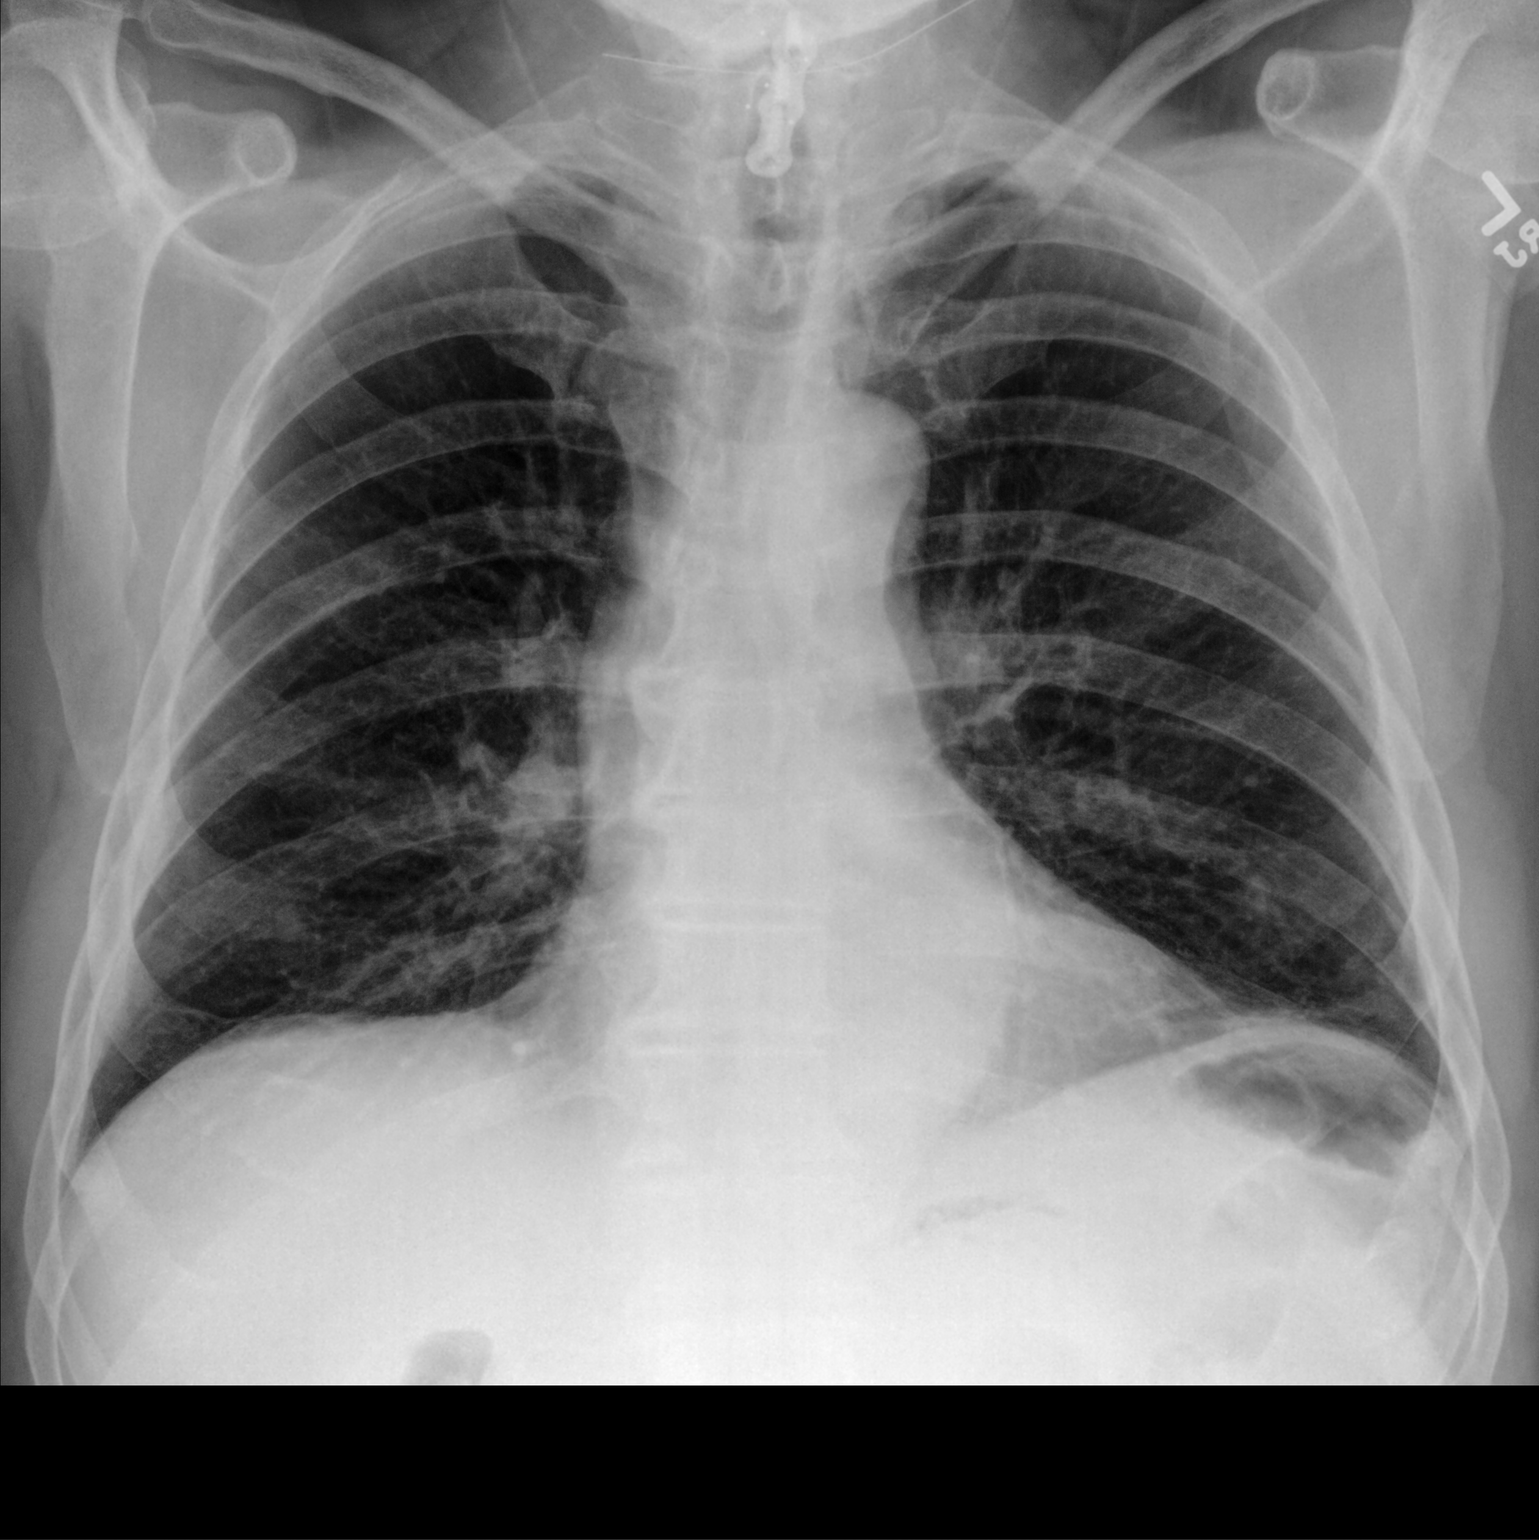

[chest lat]
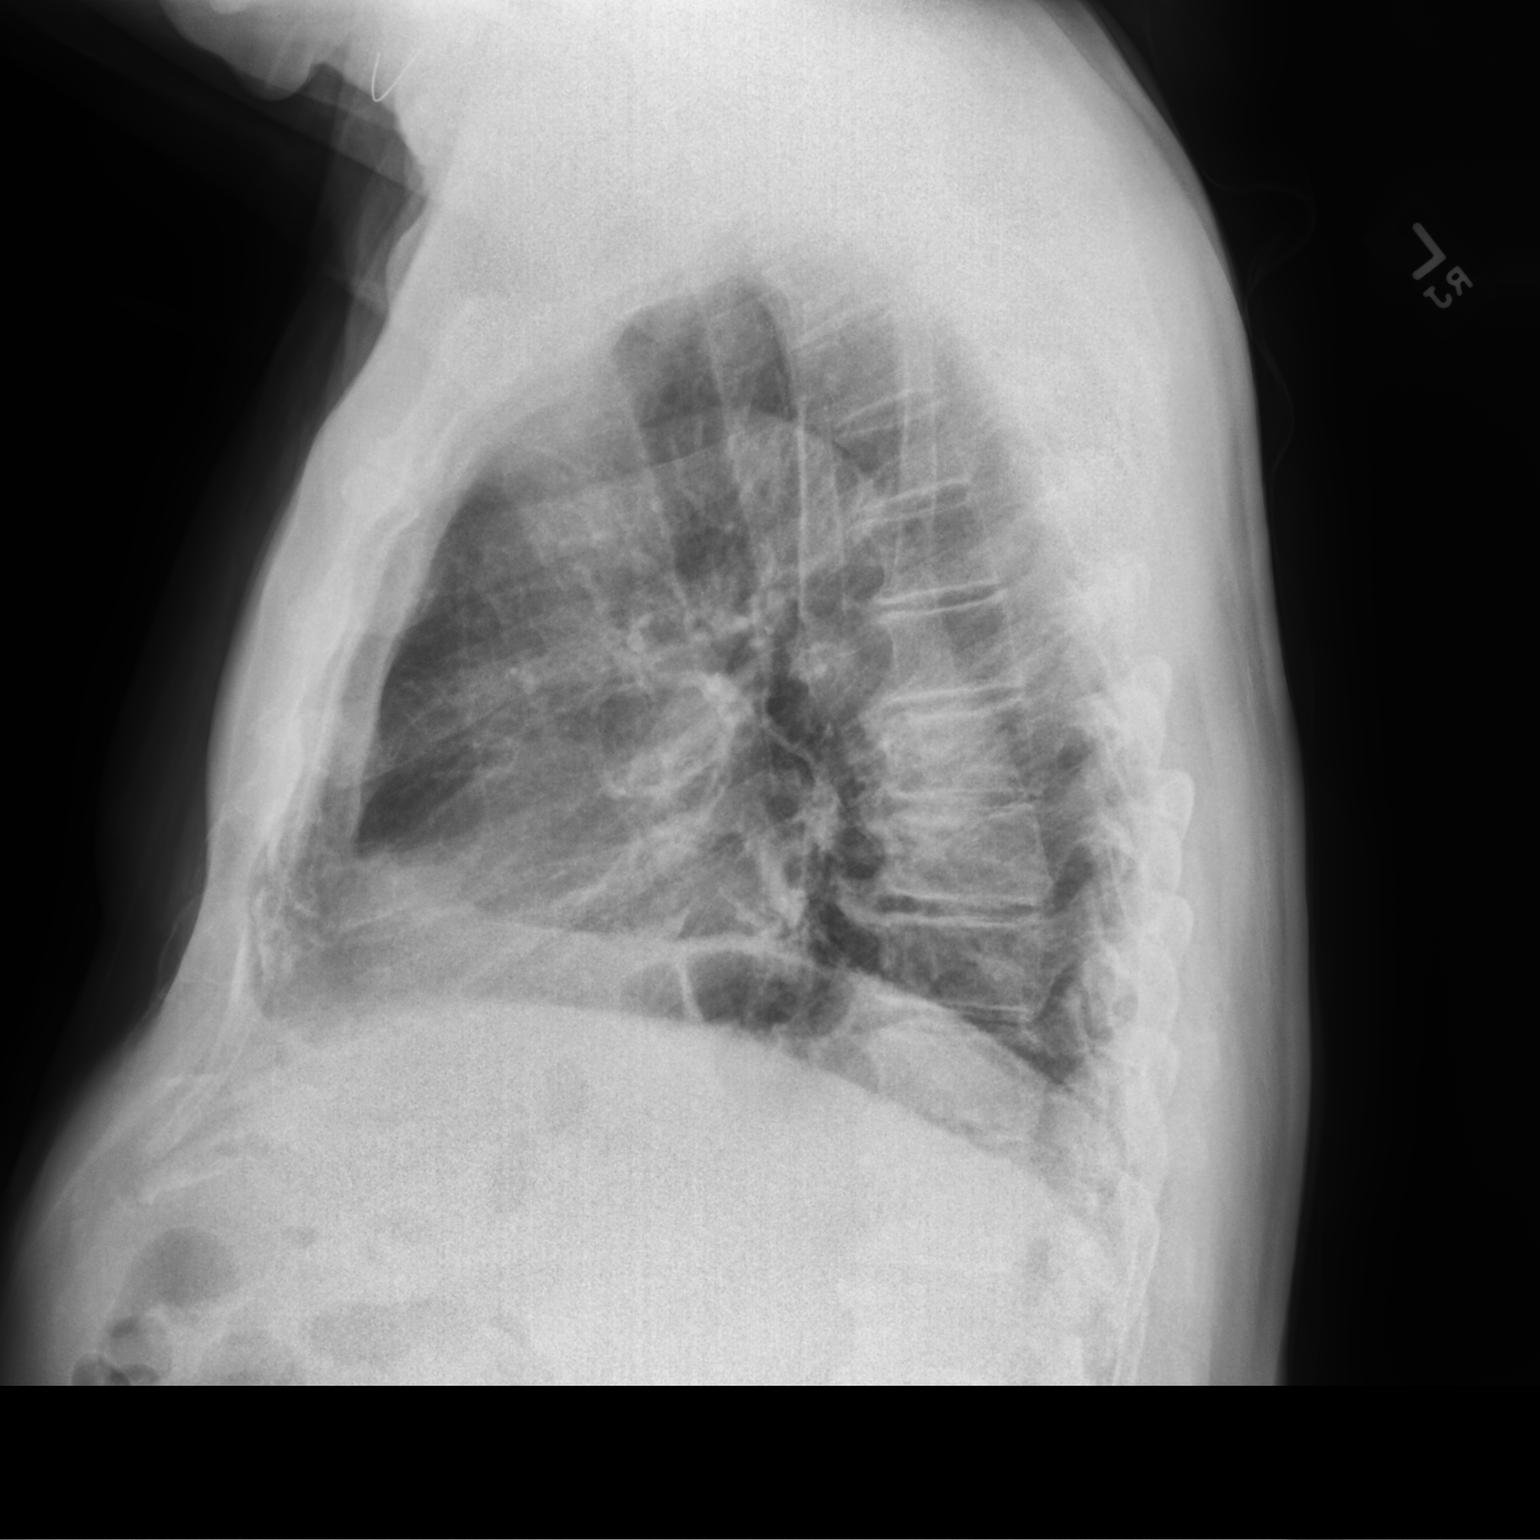

[2 of 2 positions shown; findings below may reference images not displayed]

FINDINGS: Cardiac silhouette is normal in size. No mediastinal or hilar masses
or evidence of adenopathy.

Minor linear opacity at the left lung base is consistent with
atelectasis or scarring, stable from the prior exams. Mild apical
pleuroparenchymal scarring, also unchanged. Lungs otherwise clear.

No pleural effusion or pneumothorax.

Skeletal structures are intact.
IMPRESSION: No active cardiopulmonary disease.

## 2023-02-18 DIAGNOSIS — M47816 Spondylosis without myelopathy or radiculopathy, lumbar region: Secondary | ICD-10-CM | POA: Diagnosis not present

## 2023-02-20 ENCOUNTER — Encounter: Payer: Self-pay | Admitting: Adult Health

## 2023-02-20 ENCOUNTER — Ambulatory Visit (INDEPENDENT_AMBULATORY_CARE_PROVIDER_SITE_OTHER): Payer: Medicare Other

## 2023-02-20 ENCOUNTER — Ambulatory Visit (INDEPENDENT_AMBULATORY_CARE_PROVIDER_SITE_OTHER): Payer: Medicare Other | Admitting: Adult Health

## 2023-02-20 VITALS — BP 124/74 | HR 66 | Ht 68.0 in | Wt 188.0 lb

## 2023-02-20 DIAGNOSIS — J439 Emphysema, unspecified: Secondary | ICD-10-CM | POA: Diagnosis not present

## 2023-02-20 DIAGNOSIS — J432 Centrilobular emphysema: Secondary | ICD-10-CM

## 2023-02-20 DIAGNOSIS — R053 Chronic cough: Secondary | ICD-10-CM | POA: Diagnosis not present

## 2023-02-20 DIAGNOSIS — J309 Allergic rhinitis, unspecified: Secondary | ICD-10-CM

## 2023-02-20 DIAGNOSIS — J4520 Mild intermittent asthma, uncomplicated: Secondary | ICD-10-CM

## 2023-02-20 DIAGNOSIS — I517 Cardiomegaly: Secondary | ICD-10-CM | POA: Diagnosis not present

## 2023-02-20 DIAGNOSIS — Z981 Arthrodesis status: Secondary | ICD-10-CM | POA: Diagnosis not present

## 2023-02-20 MED ORDER — ALBUTEROL SULFATE HFA 108 (90 BASE) MCG/ACT IN AERS: 1.0000 | INHALATION_SPRAY | Freq: Four times a day (QID) | RESPIRATORY_TRACT | 2 refills | Status: AC | PRN

## 2023-02-20 MED ORDER — BUDESONIDE-FORMOTEROL FUMARATE 160-4.5 MCG/ACT IN AERO
2.0000 | INHALATION_SPRAY | Freq: Two times a day (BID) | RESPIRATORY_TRACT | 12 refills | Status: AC
Start: 2023-02-20 — End: ?

## 2023-02-20 NOTE — Patient Instructions (Addendum)
Symbicort 2 puffs Twice daily , rinse after use. Albuterol inhaler As needed  Claritin 10mg  At bedtime As needed  allergies .  Labs as planned later this month  Chest xray today  Follow up with Dr. Francine Graven in 1 year with PFT and As needed

## 2023-02-20 NOTE — Assessment & Plan Note (Signed)
Mild intermittent asthma.  Continue on Symbicort 2 puffs twice daily-asthma action plan discussed. Check PFTs on return  Plan  Patient Instructions  Symbicort 2 puffs Twice daily , rinse after use. Albuterol inhaler As needed  Claritin 10mg  At bedtime As needed  allergies .  Labs as planned later this month  Chest xray today  Follow up with Dr. Francine Graven in 1 year with PFT and As needed

## 2023-02-20 NOTE — Progress Notes (Signed)
@Patient  ID: [name], male    DOB: 1935/08/20, 87 y.o.   MRN: [id number]  Chief Complaint  Patient presents with   Follow-up    Referring provider: Merri Brunette, MD  HPI: 87 yo male former smoker followed for Mild Emphysema and intermittent asthma   TEST/EVENTS :  high-resolution CT chest scan was obtained due to the chronic cough which revealed mild diffuse centrilobular emphysematous changes and millimetric pulmonary nodules which are unchanged since 08/11/2020.    02/20/2023 Follow up ; Emphysema and Asthma  Patient returns for follow-up visit.  Last seen December 2022.  Patient says overall his breathing has been doing well.  Patient has mild emphysema noted on CT scan.  Uses Symbicort during spring and fall for suspected mild intermittent asthma.  Says that over the last couple weeks he has noticed some increased allergy symptoms.  Wanted to have a refill of his Symbicort.  Has no increased albuterol use.  Patient does have Claritin or Allegra at home has not started this. Wants to wait till next month for his flu shot  He did have an episode of heat exhaustion while playing golf last month.  Says that he is totally better we went to the emergency room and got IV fluids.  He does have an appointment with his doctor later this month with lab work planned.  Patient says he is active plays golf on a regular basis.   Allergies  Allergen Reactions   Codeine Shortness Of Breath   Penicillins Rash    Immunization History  Administered Date(s) Administered   DTaP 02/14/2011   Influenza Whole 03/25/2009   Influenza, High Dose Seasonal PF 03/30/2014, 04/01/2015, 03/20/2016   Influenza, Quadrivalent, Recombinant, Inj, Pf 03/25/2017, 03/04/2018, 04/02/2019, 03/11/2020, 03/13/2021   Influenza-Unspecified 03/11/2018   PFIZER(Purple Top)SARS-COV-2 Vaccination 03/07/2020   Pneumococcal Conjugate-13 06/23/2007, 03/15/2014   Pneumococcal Polysaccharide-23 03/11/2008   Tdap  02/24/2011   Zoster Recombinant(Shingrix) 07/07/2018, 10/06/2018   Zoster, Live 03/09/2006    Past Medical History:  Diagnosis Date   Allergic rhinitis    Asthma, mild last used prn inhaler 2 yrs ago   runs 10 miles weekly---   FOLLOWED BY DR Joni Fears YOUNG   Frequency of urination    H/O bronchitis SEASONAL    History of gastroesophageal reflux (GERD) WATCHES DIET , NO PROBLEMS FOR A WHILE   History of kidney stones    Hx of tension headache    Hypertension    Mild sleep apnea USES BREATH-RITE STRIPS   NO CPAP   White coat hypertension     Tobacco History: Social History   Tobacco Use  Smoking Status Former   Current packs/day: 0.00   Types: Cigarettes   Start date: 06/11/1956   Quit date: 06/11/1974   Years since quitting: 48.7  Smokeless Tobacco Never   Counseling given: Not Answered   Outpatient Medications Prior to Visit  Medication Sig Dispense Refill   ascorbic acid (VITAMIN C) 1000 MG tablet Take 1,000 mg by mouth daily.     aspirin 81 MG EC tablet Take 81 mg by mouth daily.     methimazole (TAPAZOLE) 5 MG tablet Take 5 mg by mouth daily.     niacin 500 MG tablet Take 500 mg by mouth at bedtime.     omeprazole (PRILOSEC) 40 MG capsule Take 40 mg by mouth daily.     Vibegron (GEMTESA) 75 MG TABS Take 75 mg by mouth daily.     budesonide-formoterol (SYMBICORT) 160-4.5 MCG/ACT  inhaler Inhale 2 puffs into the lungs 2 (two) times daily. 1 each 12   Chlorphen-PE-Acetaminophen (NOREL AD) 4-10-325 MG TABS 1 tablet     Cholecalciferol (VITAMIN D3) 25 MCG (1000 UT) CAPS Take 1 capsule by mouth daily.     hydrochlorothiazide (HYDRODIURIL) 25 MG tablet Take 25 mg by mouth daily.     Multiple Vitamin (MULTI-VITAMIN) tablet Take 1 tablet by mouth daily.     No facility-administered medications prior to visit.     Review of Systems:   Constitutional:   No  weight loss, night sweats,  Fevers, chills,  +fatigue, or  lassitude.  HEENT:   No headaches,  Difficulty  swallowing,  Tooth/dental problems, or  Sore throat,                No sneezing, itching, ear ache, nasal congestion, post nasal drip,   CV:  No chest pain,  Orthopnea, PND, swelling in lower extremities, anasarca, dizziness, palpitations, syncope.   GI  No heartburn, indigestion, abdominal pain, nausea, vomiting, diarrhea, change in bowel habits, loss of appetite, bloody stools.   Resp:   No chest wall deformity  Skin: no rash or lesions.  GU: no dysuria, change in color of urine, no urgency or frequency.  No flank pain, no hematuria   MS:  No joint pain or swelling.  No decreased range of motion.  No back pain.    Physical Exam  BP 124/74 (BP Location: Left Arm, Cuff Size: Normal)   Pulse 66   Ht 5\' 8"  (1.727 m)   Wt 188 lb (85.3 kg)   SpO2 97% Comment: on RA  BMI 28.59 kg/m   GEN: A/Ox3; pleasant , NAD, well nourished    HEENT:  [place]/AT,   NOSE-clear, THROAT-clear, no lesions, no postnasal drip or exudate noted.   NECK:  Supple w/ fair ROM; no JVD; normal carotid impulses w/o bruits; no thyromegaly or nodules palpated; no lymphadenopathy.    RESP  Clear  P & A; w/o, wheezes/ rales/ or rhonchi. no accessory muscle use, no dullness to percussion  CARD:  RRR, no m/r/g, no peripheral edema, pulses intact, no cyanosis or clubbing.  GI:   Soft & nt; nml bowel sounds; no organomegaly or masses detected.   Musco: Warm bil, no deformities or joint swelling noted.   Neuro: alert, no focal deficits noted.    Skin: Warm, no lesions or rashes    Lab Results:      BNP No results found for: "BNP"  ProBNP No results found for: "PROBNP"  Imaging: No results found.  Administration History     None           No data to display          No results found for: "NITRICOXIDE"      Assessment & Plan:   Asthma Mild intermittent asthma.  Continue on Symbicort 2 puffs twice daily-asthma action plan discussed. Check PFTs on return  Plan  Patient  Instructions  Symbicort 2 puffs Twice daily , rinse after use. Albuterol inhaler As needed  Claritin 10mg  At bedtime As needed  allergies .  Labs as planned later this month  Chest xray today  Follow up with Dr. Francine Graven in 1 year with PFT and As needed       Allergic rhinitis Claritin daily as needed.      Rubye Oaks, NP 02/20/2023

## 2023-02-20 NOTE — Assessment & Plan Note (Signed)
Claritin daily as needed.

## 2023-03-04 DIAGNOSIS — M25512 Pain in left shoulder: Secondary | ICD-10-CM | POA: Diagnosis not present

## 2023-03-04 NOTE — Progress Notes (Unsigned)
Cardiology Office Note:  .   Date:  03/05/2023  ID:  [name], DOB 1935/06/29, MRN [id number] PCP: Merri Brunette, MD  [facility] Providers Cardiologist:  None {   History of Present Illness: .   [name] is a 87 y.o. male with a past medical history of asymptomatic CAD (3/22, coronary calcium score 4) who is here for follow-up appointment.  Was last seen 5/22 by Dr. Katrinka Blazing and at that time did not have any particular cardiac complaints.  Did have abnormal ZIO results.  He stated his adoptive father was concerned about something having to do with the P waves on the report.  Denied palpitations, syncope, chest pain, edema, transient neurologic symptoms, PND, and dyspnea on exertion.  Was staying physically active.  Somewhat slower now than before because he has began to experience occasional imbalance.  Today, he tells me that he overall feels well.  He ran for years and did marathons and half marathons.  He has since gave up running but does still walk for 30 minutes at [facility] center and does some body weight exercises like pushups. He recently was in the hospital for near syncope. He was on the golf course and got dehydrated and possibly hypoglycemic. No irregular HR at the time and no chest pain. He was taken to the ER and given IV fluids. It has not reoccurred.  He has not had any CV symptoms. No SOB or LE edema. Occasional "trickle of pain" in his chest that resolves quickly. Only happens about once a year.  He goes to PT constantly.   Reports no shortness of breath nor dyspnea on exertion. Reports no chest pain, pressure, or tightness. No edema, orthopnea, PND. Reports no palpitations.    ROS: Pertinent ROS in HPI   Studies Reviewed: Marland Kitchen       Coronary CT 08/12/2020 IMPRESSION: 1. Coronary calcium score is 3.57 and this is at percentile 1 for patients of the same age, gender and ethnicity. 2.  Aortic Atherosclerosis (ICD10-I70.0). 3. Tiny pulmonary nodules,  largest noncalcified nodule measuring 3 mm. These pulmonary nodules are indeterminate. No follow-up needed if patient is low-risk (and has no known or suspected primary neoplasm). Non-contrast chest CT can be considered in 12 months if patient is high-risk. This recommendation follows the consensus statement: Guidelines for Management of Incidental Pulmonary Nodules Detected on CT Images: From the Fleischner Society 2017; Radiology 2017; 284:228-243.     Physical Exam:   VS:  BP 116/60   Pulse 85   Ht 5\' 8"  (1.727 m)   Wt 188 lb 9.6 oz (85.5 kg)   SpO2 96%   BMI 28.68 kg/m    Wt Readings from Last 3 Encounters:  03/05/23 188 lb 9.6 oz (85.5 kg)  02/20/23 188 lb (85.3 kg)  06/07/21 185 lb (83.9 kg)    GEN: Well nourished, well developed in no acute distress NECK: No JVD; No carotid bruits CARDIAC: RRR, no murmurs, rubs, gallops RESPIRATORY:  Clear to auscultation without rales, wheezing or rhonchi  ABDOMEN: Soft, non-tender, non-distended EXTREMITIES:  No edema; No deformity   ASSESSMENT AND PLAN: .   1. Near syncope -Likely due to dehydration -Blood pressure well-controlled today 116/60 -Continue current medication regimen recently started on losartan 25 mg daily  2.  Coronary calcium score of 4 -Continue aspirin 81 mg daily -No sustained chest pain has or exertional chest pains at this time.  No significant shortness of breath -Continues to maintain an active  lifestyle and healthy eating habits  2.  Irregular heart rhythm   -This has not been an issue lately    Dispo: Plan to follow-up with Dr. Lynnette Caffey in a year  Signed, Sharlene Dory, PA-C

## 2023-03-05 ENCOUNTER — Ambulatory Visit: Payer: Medicare Other | Attending: Physician Assistant | Admitting: Physician Assistant

## 2023-03-05 ENCOUNTER — Encounter: Payer: Self-pay | Admitting: Physician Assistant

## 2023-03-05 VITALS — BP 116/60 | HR 85 | Ht 68.0 in | Wt 188.6 lb

## 2023-03-05 DIAGNOSIS — Z6829 Body mass index (BMI) 29.0-29.9, adult: Secondary | ICD-10-CM | POA: Diagnosis not present

## 2023-03-05 DIAGNOSIS — M546 Pain in thoracic spine: Secondary | ICD-10-CM | POA: Diagnosis not present

## 2023-03-05 DIAGNOSIS — R55 Syncope and collapse: Secondary | ICD-10-CM

## 2023-03-05 DIAGNOSIS — I2510 Atherosclerotic heart disease of native coronary artery without angina pectoris: Secondary | ICD-10-CM | POA: Diagnosis not present

## 2023-03-05 DIAGNOSIS — G8929 Other chronic pain: Secondary | ICD-10-CM | POA: Diagnosis not present

## 2023-03-05 DIAGNOSIS — I499 Cardiac arrhythmia, unspecified: Secondary | ICD-10-CM

## 2023-03-05 NOTE — Patient Instructions (Signed)
Medication Instructions:  Your physician recommends that you continue on your current medications as directed. Please refer to the Current Medication list given to you today.  *If you need a refill on your cardiac medications before your next appointment, please call your pharmacy*  Lab Work: None ordered If you have labs (blood work) drawn today and your tests are completely normal, you will receive your results only by: MyChart Message (if you have MyChart) OR A paper copy in the mail If you have any lab test that is abnormal or we need to change your treatment, we will call you to review the results.  Follow-Up: At [facility], you and your health needs are our priority.  As part of our continuing mission to provide you with exceptional heart care, we have created designated Provider Care Teams.  These Care Teams include your primary Cardiologist (physician) and Advanced Practice Providers (APPs -  Physician Assistants and Nurse Practitioners) who all work together to provide you with the care you need, when you need it.  Your next appointment:   1 year(s)  Provider:   Dr Lynnette Caffey  Low-Sodium Eating Plan Salt (sodium) helps you keep a healthy balance of fluids in your body. Too much sodium can raise your blood pressure. It can also cause fluid and waste to be held in your body. Your health care provider or dietitian may recommend a low-sodium eating plan if you have high blood pressure (hypertension), kidney disease, liver disease, or heart failure. Eating less sodium can help lower your blood pressure and reduce swelling. It can also protect your heart, liver, and kidneys. What are tips for following this plan? Reading food labels  Check food labels for the amount of sodium per serving. If you eat more than one serving, you must multiply the listed amount by the number of servings. Choose foods with less than 140 milligrams (mg) of sodium per serving. Avoid foods with 300 mg  of sodium or more per serving. Always check how much sodium is in a product, even if the label says "unsalted" or "no salt added." Shopping  Buy products labeled as "low-sodium" or "no salt added." Buy fresh foods. Avoid canned foods and pre-made or frozen meals. Avoid canned, cured, or processed meats. Buy breads that have less than 80 mg of sodium per slice. Cooking  Eat more home-cooked food. Try to eat less restaurant, buffet, and fast food. Try not to add salt when you cook. Use salt-free seasonings or herbs instead of table salt or sea salt. Check with your provider or pharmacist before using salt substitutes. Cook with plant-based oils, such as canola, sunflower, or olive oil. Meal planning When eating at a restaurant, ask if your food can be made with less salt or no salt. Avoid dishes labeled as brined, pickled, cured, or smoked. Avoid dishes made with soy sauce, miso, or teriyaki sauce. Avoid foods that have monosodium glutamate (MSG) in them. MSG may be added to some restaurant food, sauces, soups, bouillon, and canned foods. Make meals that can be grilled, baked, poached, roasted, or steamed. These are often made with less sodium. General information Try to limit your sodium intake to 1,500-2,300 mg each day, or the amount told by your provider. What foods should I eat? Fruits Fresh, frozen, or canned fruit. Fruit juice. Vegetables Fresh or frozen vegetables. "No salt added" canned vegetables. "No salt added" tomato sauce and paste. Low-sodium or reduced-sodium tomato and vegetable juice. Grains Low-sodium cereals, such as oats, puffed  wheat and rice, and shredded wheat. Low-sodium crackers. Unsalted rice. Unsalted pasta. Low-sodium bread. Whole grain breads and whole grain pasta. Meats and other proteins Fresh or frozen meat, poultry, seafood, and fish. These should have no added salt. Low-sodium canned tuna and salmon. Unsalted nuts. Dried peas, beans, and lentils without  added salt. Unsalted canned beans. Eggs. Unsalted nut butters. Dairy Milk. Soy milk. Cheese that is naturally low in sodium, such as ricotta cheese, fresh mozzarella, or Swiss cheese. Low-sodium or reduced-sodium cheese. Cream cheese. Yogurt. Seasonings and condiments Fresh and dried herbs and spices. Salt-free seasonings. Low-sodium mustard and ketchup. Sodium-free salad dressing. Sodium-free light mayonnaise. Fresh or refrigerated horseradish. Lemon juice. Vinegar. Other foods Homemade, reduced-sodium, or low-sodium soups. Unsalted popcorn and pretzels. Low-salt or salt-free chips. The items listed above may not be all the foods and drinks you can have. Talk to a dietitian to learn more. What foods should I avoid? Vegetables Sauerkraut, pickled vegetables, and relishes. Olives. [place] fries. Onion rings. Regular canned vegetables, except low-sodium or reduced-sodium items. Regular canned tomato sauce and paste. Regular tomato and vegetable juice. Frozen vegetables in sauces. Grains Instant hot cereals. Bread stuffing, pancake, and biscuit mixes. Croutons. Seasoned rice or pasta mixes. Noodle soup cups. Boxed or frozen macaroni and cheese. Regular salted crackers. Self-rising flour. Meats and other proteins Meat or fish that is salted, canned, smoked, spiced, or pickled. Precooked or cured meat, such as sausages or meat loaves. Tomasa Blase. Ham. Pepperoni. Hot dogs. Corned beef. Chipped beef. Salt pork. Jerky. Pickled herring, anchovies, and sardines. Regular canned tuna. Salted nuts. Dairy Processed cheese and cheese spreads. Hard cheeses. Cheese curds. Blue cheese. Feta cheese. String cheese. Regular cottage cheese. Buttermilk. Canned milk. Fats and oils Salted butter. Regular margarine. Ghee. Bacon fat. Seasonings and condiments Onion salt, garlic salt, seasoned salt, table salt, and sea salt. Canned and packaged gravies. Worcestershire sauce. Tartar sauce. Barbecue sauce. Teriyaki sauce. Soy  sauce, including reduced-sodium soy sauce. Steak sauce. Fish sauce. Oyster sauce. Cocktail sauce. Horseradish that you find on the shelf. Regular ketchup and mustard. Meat flavorings and tenderizers. Bouillon cubes. Hot sauce. Pre-made or packaged marinades. Pre-made or packaged taco seasonings. Relishes. Regular salad dressings. Salsa. Other foods Salted popcorn and pretzels. Corn chips and puffs. Potato and tortilla chips. Canned or dried soups. Pizza. Frozen entrees and pot pies. The items listed above may not be all the foods and drinks you should avoid. Talk to a dietitian to learn more. This information is not intended to replace advice given to you by your health care provider. Make sure you discuss any questions you have with your health care provider. Document Revised: 06/14/2022 Document Reviewed: 06/14/2022 Elsevier Patient Education  2024 [employer] Inc[employer] Heart-Healthy Eating Plan Many factors influence your heart health, including eating and exercise habits. Heart health is also called coronary health. Coronary risk increases with abnormal blood fat (lipid) levels. A heart-healthy eating plan includes limiting unhealthy fats, increasing healthy fats, limiting salt (sodium) intake, and making other diet and lifestyle changes. What is my plan? Your health care provider may recommend that: You limit your fat intake to _________% or less of your total calories each day. You limit your saturated fat intake to _________% or less of your total calories each day. You limit the amount of cholesterol in your diet to less than _________ mg per day. You limit the amount of sodium in your diet to less than _________ mg per day. What are tips for following this plan? Cooking [employer]  using methods other than frying. Baking, boiling, grilling, and broiling are all good options. Other ways to reduce fat include: Removing the skin from poultry. Removing all visible fats from meats. Steaming  vegetables in water or broth. Meal planning  At meals, imagine dividing your plate into fourths: Fill one-half of your plate with vegetables and green salads. Fill one-fourth of your plate with whole grains. Fill one-fourth of your plate with lean protein foods. Eat 2-4 cups of vegetables per day. One cup of vegetables equals 1 cup (91 g) broccoli or cauliflower florets, 2 medium carrots, 1 large bell pepper, 1 large sweet potato, 1 large tomato, 1 medium white potato, 2 cups (150 g) raw leafy greens. Eat 1-2 cups of fruit per day. One cup of fruit equals 1 small apple, 1 large banana, 1 cup (237 g) mixed fruit, 1 large orange,  cup (82 g) dried fruit, 1 cup (240 mL) 100% fruit juice. Eat more foods that contain soluble fiber. Examples include apples, broccoli, carrots, beans, peas, and barley. Aim to get 25-30 g of fiber per day. Increase your consumption of legumes, nuts, and seeds to 4-5 servings per week. One serving of dried beans or legumes equals  cup (90 g) cooked, 1 serving of nuts is  oz (12 almonds, 24 pistachios, or 7 walnut halves), and 1 serving of seeds equals  oz (8 g). Fats Choose healthy fats more often. Choose monounsaturated and polyunsaturated fats, such as olive and canola oils, avocado oil, flaxseeds, walnuts, almonds, and seeds. Eat more omega-3 fats. Choose salmon, mackerel, sardines, tuna, flaxseed oil, and ground flaxseeds. Aim to eat fish at least 2 times each week. Check food labels carefully to identify foods with trans fats or high amounts of saturated fat. Limit saturated fats. These are found in animal products, such as meats, butter, and cream. Plant sources of saturated fats include palm oil, palm kernel oil, and coconut oil. Avoid foods with partially hydrogenated oils in them. These contain trans fats. Examples are stick margarine, some tub margarines, cookies, crackers, and other baked goods. Avoid fried foods. General information Eat more home-cooked  food and less restaurant, buffet, and fast food. Limit or avoid alcohol. Limit foods that are high in added sugar and simple starches such as foods made using white refined flour (white breads, pastries, sweets). Lose weight if you are overweight. Losing just 5-10% of your body weight can help your overall health and prevent diseases such as diabetes and heart disease. Monitor your sodium intake, especially if you have high blood pressure. Talk with your health care provider about your sodium intake. Try to incorporate more vegetarian meals weekly. What foods should I eat? Fruits All fresh, canned (in natural juice), or frozen fruits. Vegetables Fresh or frozen vegetables (raw, steamed, roasted, or grilled). Green salads. Grains Most grains. Choose whole wheat and whole grains most of the time. Rice and pasta, including brown rice and pastas made with whole wheat. Meats and other proteins Lean, well-trimmed beef, veal, pork, and lamb. Chicken and [place] without skin. All fish and shellfish. Wild duck, rabbit, pheasant, and venison. Egg whites or low-cholesterol egg substitutes. Dried beans, peas, lentils, and tofu. Seeds and most nuts. Dairy Low-fat or nonfat cheeses, including ricotta and mozzarella. Skim or 1% milk (liquid, powdered, or evaporated). Buttermilk made with low-fat milk. Nonfat or low-fat yogurt. Fats and oils Non-hydrogenated (trans-free) margarines. Vegetable oils, including soybean, sesame, sunflower, olive, avocado, peanut, safflower, corn, canola, and cottonseed. Salad dressings or mayonnaise made with  a vegetable oil. Beverages Water (mineral or sparkling). Coffee and tea. Unsweetened ice tea. Diet beverages. Sweets and desserts Sherbet, gelatin, and fruit ice. Small amounts of dark chocolate. Limit all sweets and desserts. Seasonings and condiments All seasonings and condiments. The items listed above may not be a complete list of foods and beverages you can eat.  Contact a dietitian for more options. What foods should I avoid? Fruits Canned fruit in heavy syrup. Fruit in cream or butter sauce. Fried fruit. Limit coconut. Vegetables Vegetables cooked in cheese, cream, or butter sauce. Fried vegetables. Grains Breads made with saturated or trans fats, oils, or whole milk. Croissants. Sweet rolls. Donuts. High-fat crackers, such as cheese crackers and chips. Meats and other proteins Fatty meats, such as hot dogs, ribs, sausage, bacon, rib-eye roast or steak. High-fat deli meats, such as salami and bologna. Caviar. Domestic duck and goose. Organ meats, such as liver. Dairy Cream, sour cream, cream cheese, and creamed cottage cheese. Whole-milk cheeses. Whole or 2% milk (liquid, evaporated, or condensed). Whole buttermilk. Cream sauce or high-fat cheese sauce. Whole-milk yogurt. Fats and oils Meat fat, or shortening. Cocoa butter, hydrogenated oils, palm oil, coconut oil, palm kernel oil. Solid fats and shortenings, including bacon fat, salt pork, lard, and butter. Nondairy cream substitutes. Salad dressings with cheese or sour cream. Beverages Regular sodas and any drinks with added sugar. Sweets and desserts Frosting. Pudding. Cookies. Cakes. Pies. Milk chocolate or white chocolate. Buttered syrups. Full-fat ice cream or ice cream drinks. The items listed above may not be a complete list of foods and beverages to avoid. Contact a dietitian for more information. Summary Heart-healthy meal planning includes limiting unhealthy fats, increasing healthy fats, limiting salt (sodium) intake and making other diet and lifestyle changes. Lose weight if you are overweight. Losing just 5-10% of your body weight can help your overall health and prevent diseases such as diabetes and heart disease. Focus on eating a balance of foods, including fruits and vegetables, low-fat or nonfat dairy, lean protein, nuts and legumes, whole grains, and heart-healthy oils and  fats. This information is not intended to replace advice given to you by your health care provider. Make sure you discuss any questions you have with your health care provider. Document Revised: 07/03/2021 Document Reviewed: 07/03/2021 Elsevier Patient Education  2024 [employer].

## 2023-03-07 DIAGNOSIS — M25552 Pain in left hip: Secondary | ICD-10-CM | POA: Diagnosis not present

## 2023-03-07 DIAGNOSIS — M5416 Radiculopathy, lumbar region: Secondary | ICD-10-CM | POA: Diagnosis not present

## 2023-03-07 DIAGNOSIS — M25551 Pain in right hip: Secondary | ICD-10-CM | POA: Diagnosis not present

## 2023-03-12 DIAGNOSIS — E0590 Thyrotoxicosis, unspecified without thyrotoxic crisis or storm: Secondary | ICD-10-CM | POA: Diagnosis not present

## 2023-03-14 DIAGNOSIS — M19012 Primary osteoarthritis, left shoulder: Secondary | ICD-10-CM | POA: Diagnosis not present

## 2023-03-14 DIAGNOSIS — M25512 Pain in left shoulder: Secondary | ICD-10-CM | POA: Diagnosis not present

## 2023-03-14 DIAGNOSIS — S46812A Strain of other muscles, fascia and tendons at shoulder and upper arm level, left arm, initial encounter: Secondary | ICD-10-CM | POA: Diagnosis not present

## 2023-03-18 DIAGNOSIS — N3941 Urge incontinence: Secondary | ICD-10-CM | POA: Diagnosis not present

## 2023-03-18 DIAGNOSIS — R351 Nocturia: Secondary | ICD-10-CM | POA: Diagnosis not present

## 2023-03-18 DIAGNOSIS — N401 Enlarged prostate with lower urinary tract symptoms: Secondary | ICD-10-CM | POA: Diagnosis not present

## 2023-03-18 DIAGNOSIS — N200 Calculus of kidney: Secondary | ICD-10-CM | POA: Diagnosis not present

## 2023-03-26 ENCOUNTER — Other Ambulatory Visit: Payer: Self-pay | Admitting: Urology

## 2023-03-26 DIAGNOSIS — N401 Enlarged prostate with lower urinary tract symptoms: Secondary | ICD-10-CM

## 2023-03-27 DIAGNOSIS — M25512 Pain in left shoulder: Secondary | ICD-10-CM | POA: Diagnosis not present

## 2023-04-03 DIAGNOSIS — Z23 Encounter for immunization: Secondary | ICD-10-CM | POA: Diagnosis not present

## 2023-04-04 DIAGNOSIS — M25551 Pain in right hip: Secondary | ICD-10-CM | POA: Diagnosis not present

## 2023-04-04 DIAGNOSIS — M5416 Radiculopathy, lumbar region: Secondary | ICD-10-CM | POA: Diagnosis not present

## 2023-04-04 DIAGNOSIS — M25552 Pain in left hip: Secondary | ICD-10-CM | POA: Diagnosis not present

## 2023-04-10 ENCOUNTER — Ambulatory Visit
Admission: RE | Admit: 2023-04-10 | Discharge: 2023-04-10 | Disposition: A | Discharge status: Home / Self Care | Payer: PRIVATE HEALTH INSURANCE | Source: Ambulatory Visit | Attending: Urology | Admitting: Urology

## 2023-04-10 DIAGNOSIS — R32 Unspecified urinary incontinence: Secondary | ICD-10-CM | POA: Diagnosis not present

## 2023-04-10 DIAGNOSIS — N401 Enlarged prostate with lower urinary tract symptoms: Secondary | ICD-10-CM | POA: Diagnosis not present

## 2023-04-10 HISTORY — PX: IR RADIOLOGIST EVAL & MGMT: IMG5224

## 2023-04-10 NOTE — Consult Note (Signed)
Chief Complaint: Lower urinary tract symptoms  Referring Physician(s): Wrenn,John  PCP: Dr. Merri Brunette   History of Present Illness: [name] is a 87 y.o. male presenting today to [facility] clinic, kindly referred by Dr. Annabell Howells, to discuss lower urinary tract symptoms and possible prostate artery embolization as therapy.   [name] is here today by himself for the interview.   He has been dealing with these type symptoms for years, and he states he has been seeing Dr. Annabell Howells and his office for a long time.  He tells me he has wanted to "gradually escalate his care with these symptoms."  According to Dr. Belva Crome notes, he had a Urolift performed 05/2018.  [name] tells me that he has occasional incontinence during the day and night.  Does not seem to be stress incontinence.    Today he scores 2,3,2,3,3,1,2 = 16 on the IPSS.  He has previously scored 18.  His quality of life is recorded as 3/Mixed.   He denies any hematuria or recurrent UTI.  He denies any prior episode of acute urinary retention.   He has had urodynamics performed with [facility].   He continues currently on silodosin and also takes gemtesa.   He denies any prior MI or stroke.  He has care with both cardiology and pulmonology.   Past Medical History:  Diagnosis Date   Allergic rhinitis    Asthma, mild last used prn inhaler 2 yrs ago   runs 10 miles weekly---   FOLLOWED BY DR Joni Fears YOUNG   Frequency of urination    H/O bronchitis SEASONAL    History of gastroesophageal reflux (GERD) WATCHES DIET , NO PROBLEMS FOR A WHILE   History of kidney stones    Hx of tension headache    Hypertension    Mild sleep apnea USES BREATH-RITE STRIPS   NO CPAP   White coat hypertension     Past Surgical History:  Procedure Laterality Date   ANTERIOR CERVICAL DECOMP/DISCECTOMY FUSION  JAN 2007   BLEPHAROPLASTY  2005   LEFT UPPER EYELID   HYDROCELE EXCISION  10/08/2011   Procedure: HYDROCELECTOMY ADULT;   Surgeon: Valetta Fuller, MD;  Location: [facility];  Service: Urology;  Laterality: Left;   LUMBAR FUSION  OCT 2005   SPINE SURGERY     cervial and lumbar    Allergies: Codeine and Penicillins  Medications: Prior to Admission medications   Medication Sig Start Date End Date Taking? Authorizing Provider  albuterol (VENTOLIN HFA) 108 (90 Base) MCG/ACT inhaler Inhale 1-2 puffs into the lungs every 6 (six) hours as needed. 02/20/23   Parrett, Virgel Bouquet, NP  ascorbic acid (VITAMIN C) 1000 MG tablet Take 1,000 mg by mouth daily.    [provider]  aspirin 81 MG EC tablet Take 81 mg by mouth daily.    [provider]  budesonide-formoterol (SYMBICORT) 160-4.5 MCG/ACT inhaler Inhale 2 puffs into the lungs 2 (two) times daily. 02/20/23   Parrett, Virgel Bouquet, NP  Cholecalciferol 125 MCG (5000 UT) capsule Take 5,000 Units by mouth daily.    [provider]  losartan (COZAAR) 25 MG tablet Take 25 mg by mouth daily.    [provider]  methimazole (TAPAZOLE) 5 MG tablet Take 5 mg by mouth daily. 10/17/20   [provider]  niacin 500 MG tablet Take 500 mg by mouth at bedtime.    [provider]  omeprazole (PRILOSEC) 40 MG capsule Take 40 mg  by mouth daily.    [provider]  silodosin (RAPAFLO) 8 MG CAPS capsule Take 8 mg by mouth daily with breakfast.    [provider]  Vibegron (GEMTESA) 75 MG TABS Take 75 mg by mouth daily.    [provider]     Family History  Problem Relation Age of Onset   Cancer Mother    Cancer Father    Asthma Daughter     Social History   Socioeconomic History   Marital status: Married    Spouse name: Not on file   Number of children: 1   Years of education: Not on file   Highest education level: Not on file  Occupational History   Occupation: former Forensic scientist or [employer]  Tobacco Use   Smoking status: Former    Current packs/day: 0.00    Types: Cigarettes    Start  date: 06/11/1956    Quit date: 06/11/1974    Years since quitting: 48.8   Smokeless tobacco: Never  Vaping Use   Vaping status: Never Used  Substance and Sexual Activity   Alcohol use: Yes    Alcohol/week: 1.0 standard drink of alcohol    Types: 1 Cans of beer per week    Comment: 3-4 beers a week   Drug use: Never   Sexual activity: Yes  Other Topics Concern   Not on file  Social History Narrative   caffeine - 1 cup daily   Exercise - 2-3 times weekly   Positive history passive tobacco exposure   Social Determinants of Health   Financial Resource Strain: Not on file  Food Insecurity: Not on file  Transportation Needs: Not on file  Physical Activity: Not on file  Stress: Not on file  Social Connections: Not on file       Review of Systems: A 12 point ROS discussed and pertinent positives are indicated in the HPI above.  All other systems are negative.  Review of Systems  Vital Signs: BP 125/73   Pulse 78   Temp 97.9 F (36.6 C)   Resp 20   SpO2 96%       Physical Exam General: 87 yo male appearing stated age.  Well-developed, well-nourished.  No distress. HEENT: Atraumatic, normocephalic.  Conjugate gaze, extra-ocular motor intact. No scleral icterus or scleral injection. No lesions on external ears, nose, lips, or gums.  Oral mucosa moist, pink.  Neck: Symmetric with no goiter enlargement.  Chest/Lungs:  Symmetric chest with inspiration/expiration.  No labored breathing.  Clear to auscultation with no wheezes, rhonchi, or rales.  Heart:  RRR, with no third heart sounds appreciated. No JVD appreciated.  Abdomen:  Soft, NT/ND, with + bowel sounds.   Genito-urinary: Deferred Neurologic: Alert & Oriented to person, place, and time.   Normal affect and insight.  Appropriate questions.  Moving all 4 extremities with gross sensory intact.  Pulse Exam:  No bruit appreciated.  Palpable left and right radial pulse Extremities: No swelling  Mallampati Score:      Imaging: No results found.  Labs:  CBC: Recent Labs    01/21/23 1546  WBC 10.8*  HGB 15.4  HCT 45.2  PLT 225    COAGS: No results for input(s): "INR", "APTT" in the last 8760 hours.  BMP: Recent Labs    01/21/23 1546  NA 139  K 4.2  CL 101  CO2 27  GLUCOSE 85  BUN 23  CALCIUM 9.8  CREATININE 1.88*  GFRNONAA 35*  LIVER FUNCTION TESTS: No results for input(s): "BILITOT", "AST", "ALT", "ALKPHOS", "PROT", "ALBUMIN" in the last 8760 hours.  TUMOR MARKERS: No results for input(s): "AFPTM", "CEA", "CA199", "CHROMGRNA" in the last 8760 hours.  Assessment and Plan:  [name] is a very pleasant 87 yo gentleman presenting with moderate symptoms of lower urinary tract symptoms secondary to BPH, life-style lmiting, with QoL score of 3/6.  His estimated prostate size is ~65g, based on prior CT dimensions.    I had a lengthy discussion today with [name] regarding the relevant anatomy of the prostate, the ubiquitous enlargement of the prostate over time/BPH (70% of 85yo), the resulting LUTS that can be attributable to the BPH, and scope of the issue, generally accepted to be about 25% of aging men with moderate/severe LUTS and decreased QoL.     We briefly discussed the role of medical therapy for treatment ( alpha-blockers, 5-ARI) for baseline, and then traditional surgical therapies such as TURP, robotic/open prostatectomy, and also the expanding space of minimally invasive therapies.  This includes both surgical options as well as our options with prostatic artery embolization.     Regarding PAE, we went over the expanding experience of the medical community since they were first performed in 2010, the difference between the surgical goals (tissue removal or replacement) and the endovascular goals (decreased perfusion and shrinkage/ischemia), and the results of our limited randomized controlled trials and other meta-analysis.  I shared with them that PAE has been  compared to TURP regarding both quantifiable measures such as PVR and flow, as well as qualitative measures such as QoL and IPSS.  Overall, TURP performs better from magnitude of results, but both TURP and PAE result in a significant improvement of all symptoms.  Generally, PAE has been shown to improve the IPSS score reliably with a 30-50% reduction.     We also discussed the side effect profile and the risks.  Risks specific to PAE include bleeding, infection, arterial injury, contrast reaction, acute kidney injury, post-embolization syndrome, need for further surgery/procedure, non-target embolization (specifically rectum/colon, bladder, corpus), anesthesia/sedation effects, cardiopulmonary collapse, death.  Regarding the specific concern of non-target embolization of the pudendal artery/penis, while this is theoretically a risk, all of our meta-analysis show that this complication has not been reported.   Major risk is generally cited as 0.5% risk. I did let him also know that there is a typical prodromic syndrome after embolization, "post-embolization syndrome" that is to be expected; pain, dysuria, low-grade fever, malaise.  This can rarely lead to acute urinary retention.    The one symptom of his that is causing him some concern is the incontinence. I did let him know that our embolization procedure will likely not change this symptom much, as it is designed specifically for LUTS related to BPH.    After our discussion, he is interested in treatment.    He needs to have an operation on his left shoulder, which is scheduled, so he would like to delay slightly such that ours is sometime in January.   Plan: - We will plan for CTA pelvis for planning purpose.  Again time frame such that his therapy will be in January 2025 - Plan then for pelvic angiogram and possible prostate artery embolization with Dr. Loreta Ave, at [facility], January 2025.  Outpatient with moderate sedation - Continue current  care    Thank you for this interesting consult.  I greatly enjoyed meeting [name] and look forward to participating in  their care.  A copy of this report was sent to the requesting provider on this date.  Electronically Signed: Gilmer Mor 04/10/2023, 9:58 AM   I spent a total of  60 Minutes   in face to face in clinical consultation, reviewing the notes, reviewing labs and imaging, ordering further tests,  documentation, greater than 50% of which was counseling/coordinating care for LUTS/BPH, possible pelvic angiogram and possible embolization   MDM chronic illness with exacerbation, review, and moderate risk level, CPT [phone]

## 2023-04-11 DIAGNOSIS — E559 Vitamin D deficiency, unspecified: Secondary | ICD-10-CM | POA: Diagnosis not present

## 2023-04-11 DIAGNOSIS — E0590 Thyrotoxicosis, unspecified without thyrotoxic crisis or storm: Secondary | ICD-10-CM | POA: Diagnosis not present

## 2023-04-11 DIAGNOSIS — M8580 Other specified disorders of bone density and structure, unspecified site: Secondary | ICD-10-CM | POA: Diagnosis not present

## 2023-04-11 DIAGNOSIS — I10 Essential (primary) hypertension: Secondary | ICD-10-CM | POA: Diagnosis not present

## 2023-04-18 DIAGNOSIS — M67814 Other specified disorders of tendon, left shoulder: Secondary | ICD-10-CM | POA: Diagnosis not present

## 2023-04-18 DIAGNOSIS — M75122 Complete rotator cuff tear or rupture of left shoulder, not specified as traumatic: Secondary | ICD-10-CM | POA: Diagnosis not present

## 2023-04-18 DIAGNOSIS — G8918 Other acute postprocedural pain: Secondary | ICD-10-CM | POA: Diagnosis not present

## 2023-04-18 DIAGNOSIS — M75112 Incomplete rotator cuff tear or rupture of left shoulder, not specified as traumatic: Secondary | ICD-10-CM | POA: Diagnosis not present

## 2023-04-18 HISTORY — PX: ROTATOR CUFF REPAIR: SHX139

## 2023-04-30 DIAGNOSIS — M25562 Pain in left knee: Secondary | ICD-10-CM | POA: Diagnosis not present

## 2023-04-30 DIAGNOSIS — M25512 Pain in left shoulder: Secondary | ICD-10-CM | POA: Diagnosis not present

## 2023-05-06 DIAGNOSIS — H40033 Anatomical narrow angle, bilateral: Secondary | ICD-10-CM | POA: Diagnosis not present

## 2023-05-06 DIAGNOSIS — H5000 Unspecified esotropia: Secondary | ICD-10-CM | POA: Diagnosis not present

## 2023-05-06 DIAGNOSIS — Z961 Presence of intraocular lens: Secondary | ICD-10-CM | POA: Diagnosis not present

## 2023-05-06 DIAGNOSIS — D3132 Benign neoplasm of left choroid: Secondary | ICD-10-CM | POA: Diagnosis not present

## 2023-05-07 DIAGNOSIS — M25562 Pain in left knee: Secondary | ICD-10-CM | POA: Diagnosis not present

## 2023-05-07 DIAGNOSIS — M25512 Pain in left shoulder: Secondary | ICD-10-CM | POA: Diagnosis not present

## 2023-05-14 DIAGNOSIS — M25512 Pain in left shoulder: Secondary | ICD-10-CM | POA: Diagnosis not present

## 2023-05-14 DIAGNOSIS — M25562 Pain in left knee: Secondary | ICD-10-CM | POA: Diagnosis not present

## 2023-05-17 DIAGNOSIS — H5000 Unspecified esotropia: Secondary | ICD-10-CM | POA: Diagnosis not present

## 2023-05-17 DIAGNOSIS — D3132 Benign neoplasm of left choroid: Secondary | ICD-10-CM | POA: Diagnosis not present

## 2023-05-17 DIAGNOSIS — Z961 Presence of intraocular lens: Secondary | ICD-10-CM | POA: Diagnosis not present

## 2023-05-17 DIAGNOSIS — H40033 Anatomical narrow angle, bilateral: Secondary | ICD-10-CM | POA: Diagnosis not present

## 2023-05-21 DIAGNOSIS — M25512 Pain in left shoulder: Secondary | ICD-10-CM | POA: Diagnosis not present

## 2023-05-21 DIAGNOSIS — M25562 Pain in left knee: Secondary | ICD-10-CM | POA: Diagnosis not present

## 2023-05-28 DIAGNOSIS — M25512 Pain in left shoulder: Secondary | ICD-10-CM | POA: Diagnosis not present

## 2023-05-28 DIAGNOSIS — M25562 Pain in left knee: Secondary | ICD-10-CM | POA: Diagnosis not present

## 2023-06-11 DIAGNOSIS — M25512 Pain in left shoulder: Secondary | ICD-10-CM | POA: Diagnosis not present

## 2023-06-11 DIAGNOSIS — M25562 Pain in left knee: Secondary | ICD-10-CM | POA: Diagnosis not present

## 2023-06-13 ENCOUNTER — Other Ambulatory Visit: Payer: Self-pay | Admitting: Interventional Radiology

## 2023-06-13 DIAGNOSIS — N401 Enlarged prostate with lower urinary tract symptoms: Secondary | ICD-10-CM

## 2023-06-14 ENCOUNTER — Other Ambulatory Visit (HOSPITAL_COMMUNITY): Payer: Self-pay | Admitting: Interventional Radiology

## 2023-06-14 ENCOUNTER — Ambulatory Visit (HOSPITAL_COMMUNITY)
Admission: RE | Admit: 2023-06-14 | Discharge: 2023-06-14 | Disposition: A | Discharge status: Home / Self Care | Payer: Medicare Other | Source: Ambulatory Visit | Attending: Interventional Radiology

## 2023-06-14 DIAGNOSIS — N401 Enlarged prostate with lower urinary tract symptoms: Secondary | ICD-10-CM | POA: Insufficient documentation

## 2023-06-14 DIAGNOSIS — K4020 Bilateral inguinal hernia, without obstruction or gangrene, not specified as recurrent: Secondary | ICD-10-CM | POA: Diagnosis not present

## 2023-06-14 DIAGNOSIS — K5730 Diverticulosis of large intestine without perforation or abscess without bleeding: Secondary | ICD-10-CM | POA: Diagnosis not present

## 2023-06-14 DIAGNOSIS — I700 Atherosclerosis of aorta: Secondary | ICD-10-CM | POA: Diagnosis not present

## 2023-06-14 MED ORDER — IOHEXOL 350 MG/ML SOLN
100.0000 mL | Freq: Once | INTRAVENOUS | Status: AC | PRN
  Administered 2023-06-14: 100 mL via INTRAVENOUS

## 2023-06-14 MED ORDER — SODIUM CHLORIDE (PF) 0.9 % IJ SOLN
INTRAMUSCULAR | Status: AC
  Filled 2023-06-14: qty 50

## 2023-06-18 DIAGNOSIS — M25562 Pain in left knee: Secondary | ICD-10-CM | POA: Diagnosis not present

## 2023-06-18 DIAGNOSIS — M25512 Pain in left shoulder: Secondary | ICD-10-CM | POA: Diagnosis not present

## 2023-06-24 LAB — POCT I-STAT CREATININE: Creatinine, Ser: 1.3 mg/dL — ABNORMAL HIGH (ref 0.61–1.24)

## 2023-06-25 DIAGNOSIS — M25562 Pain in left knee: Secondary | ICD-10-CM | POA: Diagnosis not present

## 2023-06-25 DIAGNOSIS — M25512 Pain in left shoulder: Secondary | ICD-10-CM | POA: Diagnosis not present

## 2023-07-01 DIAGNOSIS — M25512 Pain in left shoulder: Secondary | ICD-10-CM | POA: Diagnosis not present

## 2023-07-03 DIAGNOSIS — M25512 Pain in left shoulder: Secondary | ICD-10-CM | POA: Diagnosis not present

## 2023-07-03 DIAGNOSIS — M25562 Pain in left knee: Secondary | ICD-10-CM | POA: Diagnosis not present

## 2023-07-05 ENCOUNTER — Other Ambulatory Visit: Payer: Self-pay | Admitting: Student

## 2023-07-05 DIAGNOSIS — N401 Enlarged prostate with lower urinary tract symptoms: Secondary | ICD-10-CM

## 2023-07-05 NOTE — H&P (Signed)
Chief Complaint: Benign prostatic hyperplasia with lower urinary tract symptoms  Referring Provider(s): Ranelle Oyster  Supervising Physician: Gilmer Mor  Patient Status: [facility] - Out-pt  History of Present Illness: [name] is an 88 y.o. male ex smoker with PMH sig for asthma, GERD, renal stones, HTN, sleep apnea, and BPH with LUTS. He underwent consultation with Dr. Loreta Ave on 04/10/23 to discuss treatment options for BPH with LUTS and was deemed an appropriate candidate for prostate artery embolization. He presents today for the procedure.   *** Patient is Full Code  Past Medical History:  Diagnosis Date   Allergic rhinitis    Asthma, mild last used prn inhaler 2 yrs ago   runs 10 miles weekly---   FOLLOWED BY DR Joni Fears YOUNG   Frequency of urination    H/O bronchitis SEASONAL    History of gastroesophageal reflux (GERD) WATCHES DIET , NO PROBLEMS FOR A WHILE   History of kidney stones    Hx of tension headache    Hypertension    Mild sleep apnea USES BREATH-RITE STRIPS   NO CPAP   White coat hypertension     Past Surgical History:  Procedure Laterality Date   ANTERIOR CERVICAL DECOMP/DISCECTOMY FUSION  JAN 2007   BLEPHAROPLASTY  2005   LEFT UPPER EYELID   HYDROCELE EXCISION  10/08/2011   Procedure: HYDROCELECTOMY ADULT;  Surgeon: Valetta Fuller, MD;  Location: [facility];  Service: Urology;  Laterality: Left;   IR RADIOLOGIST EVAL & MGMT  04/10/2023   LUMBAR FUSION  OCT 2005   SPINE SURGERY     cervial and lumbar    Allergies: Codeine and Penicillins  Medications: Prior to Admission medications   Medication Sig Start Date End Date Taking? Authorizing Provider  albuterol (VENTOLIN HFA) 108 (90 Base) MCG/ACT inhaler Inhale 1-2 puffs into the lungs every 6 (six) hours as needed. 02/20/23   Parrett, Virgel Bouquet, NP  ascorbic acid (VITAMIN C) 1000 MG tablet Take 1,000 mg by mouth daily.    [provider]  aspirin 81 MG EC tablet Take 81 mg  by mouth daily.    [provider]  budesonide-formoterol (SYMBICORT) 160-4.5 MCG/ACT inhaler Inhale 2 puffs into the lungs 2 (two) times daily. 02/20/23   Parrett, Virgel Bouquet, NP  Cholecalciferol 125 MCG (5000 UT) capsule Take 5,000 Units by mouth daily.    [provider]  losartan (COZAAR) 25 MG tablet Take 25 mg by mouth daily.    [provider]  methimazole (TAPAZOLE) 5 MG tablet Take 5 mg by mouth daily. 10/17/20   [provider]  niacin 500 MG tablet Take 500 mg by mouth at bedtime.    [provider]  omeprazole (PRILOSEC) 40 MG capsule Take 40 mg by mouth daily.    [provider]  silodosin (RAPAFLO) 8 MG CAPS capsule Take 8 mg by mouth daily with breakfast.    [provider]  Vibegron (GEMTESA) 75 MG TABS Take 75 mg by mouth daily.    [provider]     Family History  Problem Relation Age of Onset   Cancer Mother    Cancer Father    Asthma Daughter     Social History   Socioeconomic History   Marital status: Married    Spouse name: Not on file   Number of children: 1   Years of education: Not on file   Highest education level: Not on file  Occupational History   Occupation:  former Forensic scientist or [employer]  Tobacco Use   Smoking status: Former    Current packs/day: 0.00    Types: Cigarettes    Start date: 06/11/1956    Quit date: 06/11/1974    Years since quitting: 49.0   Smokeless tobacco: Never  Vaping Use   Vaping status: Never Used  Substance and Sexual Activity   Alcohol use: Yes    Alcohol/week: 1.0 standard drink of alcohol    Types: 1 Cans of beer per week    Comment: 3-4 beers a week   Drug use: Never   Sexual activity: Yes  Other Topics Concern   Not on file  Social History Narrative   caffeine - 1 cup daily   Exercise - 2-3 times weekly   Positive history passive tobacco exposure   Social Drivers of Corporate investment banker Strain: Not on file  Food Insecurity: Not on file   Transportation Needs: Not on file  Physical Activity: Not on file  Stress: Not on file  Social Connections: Not on file       Review of Systems  Vital Signs:   Advance Care Plan: no documents on file   Physical Exam  Imaging: CT ANGIO PELVIS W OR WO CONTRAST Result Date: 06/17/2023 CLINICAL DATA:  88 year old male with history of benign prostatic hyperplasia with lower urinary tract symptoms, forthcoming prostate artery embolization. EXAM: CT ANGIOGRAPHY OF PELVIS TECHNIQUE: Multidetector CT imaging of the abdomen and pelvis was performed using the standard protocol during bolus administration of intravenous contrast. Multiplanar reconstructed images and MIPs were obtained and reviewed to evaluate the vascular anatomy. RADIATION DOSE REDUCTION: This exam was performed according to the departmental dose-optimization program which includes automated exposure control, adjustment of the mA and/or kV according to patient size and/or use of iterative reconstruction technique. CONTRAST:  OMNIPAQUE IOHEXOL 350 MG/ML SOLN COMPARISON:  CT abdomen pelvis from 11/14/2020 FINDINGS: VASCULAR Distal aorta: Atherosclerosis without signficant stenosis, dissection, or aneurysm. Inflow: Patent without evidence of aneurysm, dissection, vasculitis or significant stenosis. Proximal Outflow: High bifurcation of the right common femoral artery. The visualized outflow vessels are patent bilaterally. Minimal scattered atherosclerotic calcifications. Prostatic arteries: The right prostatic artery appears to arise from the proximal internal pudendal artery ([place] type 4) , which arises proximal to the common trunk of the superior and inferior gluteal arteries ([place] Group B). The left prostatic artery arises from the proximal common trunk of the anterior division of the internal iliac artery ([place] type 2). Veins: No significant venous abnormality. Review of the MIP images confirms the above findings.  NON-VASCULAR Urinary Tract: The bladder is within normal limits. No evidence of hydroureter. Bowel: Scattered sigmoid diverticula. No evidence of bowel wall thickening, distention, or inflammatory changes. Lymphatic: No pelvic lymphadenopathy. Reproductive: The prostate measures 4.9 x 5.7 x 6.8 cm (AP by trans by CC), calculated volume of 99 g. Scattered dystrophic calcifications and possible surgical clips, unchanged. Other: Small fat containing bilateral inguinal hernias. No pelvic fluid collections. Musculoskeletal: No acute osseous abnormality. Partially visualized lower lumbar multilevel posterior spinal fusion without complicating features. IMPRESSION: VASCULAR 1. 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 Of note, the right internal pudendal artery arises proximally to the common trunk of the superior and inferior gluteal arteries ([place]aki Group B). 2. The left prostatic artery arises from the proximal common trunk of the anterior division of the internal iliac artery ([place] type 2). 3.  Aortic Atherosclerosis (ICD10-I70.0). NON-VASCULAR 1. Prostatomegaly (  99 g). 2. Diverticulosis. 3. Small, fat containing, uncomplicated bilateral inguinal hernias. Marliss Coots, MD [facility] Radiology Electronically Signed   By: Marliss Coots M.D.   On: 06/17/2023 10:05    Labs:  CBC: Recent Labs    01/21/23 1546  WBC 10.8*  HGB 15.4  HCT 45.2  PLT 225    COAGS: No results for input(s): "INR", "APTT" in the last 8760 hours.  BMP: Recent Labs    01/21/23 1546 06/14/23 1216  NA 139  --   K 4.2  --   CL 101  --   CO2 27  --   GLUCOSE 85  --   BUN 23  --   CALCIUM 9.8  --   CREATININE 1.88* 1.30*  GFRNONAA 35*  --     LIVER FUNCTION TESTS: No results for input(s): "BILITOT", "AST", "ALT", "ALKPHOS", "PROT", "ALBUMIN" in the last 8760 hours.  TUMOR MARKERS: No results for input(s):  "AFPTM", "CEA", "CA199", "CHROMGRNA" in the last 8760 hours.  Assessment and Plan: 88 y.o. male ex smoker with PMH sig for asthma, GERD, renal stones, HTN, sleep apnea, and BPH with LUTS. He underwent consultation with Dr. Loreta Ave on 04/10/23 to discuss treatment options for BPH with LUTS and was deemed an appropriate candidate for prostate artery embolization. He presents today for the procedure.Risks and benefits of procedure were discussed with the patient including, but not limited to bleeding, infection, vascular injury or contrast induced renal failure.  This interventional procedure involves the use of X-rays and because of the nature of the planned procedure, it is possible that we will have prolonged use of X-ray fluoroscopy.  Potential radiation risks to you include (but are not limited to) the following: - A slightly elevated risk for cancer  several years later in life. This risk is typically less than 0.5% percent. This risk is low in comparison to the normal incidence of human cancer, which is 33% for women and 50% for men according to the American [employer] Society. - Radiation induced injury can include skin redness, resembling a rash, tissue breakdown / ulcers and hair loss (which can be temporary or permanent).   The likelihood of either of these occurring depends on the difficulty of the procedure and whether you are sensitive to radiation Cook to previous procedures, disease, or genetic conditions.   IF your procedure requires a prolonged use of radiation, you will be notified and given written instructions for further action.  It is your responsibility to monitor the irradiated area for the 2 weeks following the procedure and to notify your physician if you are concerned that you have suffered a radiation induced injury.    All of the patient's questions were answered, patient is agreeable to proceed.  Consent signed and in chart.      Thank you for allowing our service to  participate in [name] 's care.  Electronically Signed: D. Jeananne Rama, PA-C   07/05/2023, 7:36 PM      I spent a total of    25 Minutes in face to face in clinical consultation, greater than 50% of which was counseling/coordinating care for prostate artery embolization

## 2023-07-08 ENCOUNTER — Encounter (HOSPITAL_COMMUNITY): Payer: Self-pay

## 2023-07-08 ENCOUNTER — Other Ambulatory Visit (HOSPITAL_COMMUNITY): Payer: Self-pay

## 2023-07-08 ENCOUNTER — Ambulatory Visit (HOSPITAL_COMMUNITY)
Admission: RE | Admit: 2023-07-08 | Discharge: 2023-07-08 | Disposition: A | Discharge status: Home / Self Care | Payer: Medicare Other | Source: Ambulatory Visit | Attending: Interventional Radiology | Admitting: Interventional Radiology

## 2023-07-08 ENCOUNTER — Ambulatory Visit (HOSPITAL_COMMUNITY)
Admission: RE | Admit: 2023-07-08 | Discharge: 2023-07-08 | Disposition: A | Discharge status: Home / Self Care | Payer: Medicare Other | Source: Ambulatory Visit

## 2023-07-08 ENCOUNTER — Other Ambulatory Visit (HOSPITAL_COMMUNITY): Payer: Self-pay | Admitting: Interventional Radiology

## 2023-07-08 ENCOUNTER — Other Ambulatory Visit: Payer: Self-pay

## 2023-07-08 VITALS — BP 155/82 | HR 65 | Temp 97.6°F | Resp 20 | Ht 68.0 in | Wt 188.0 lb

## 2023-07-08 DIAGNOSIS — G4730 Sleep apnea, unspecified: Secondary | ICD-10-CM | POA: Diagnosis not present

## 2023-07-08 DIAGNOSIS — N401 Enlarged prostate with lower urinary tract symptoms: Secondary | ICD-10-CM

## 2023-07-08 DIAGNOSIS — J45909 Unspecified asthma, uncomplicated: Secondary | ICD-10-CM | POA: Insufficient documentation

## 2023-07-08 DIAGNOSIS — I10 Essential (primary) hypertension: Secondary | ICD-10-CM | POA: Diagnosis not present

## 2023-07-08 DIAGNOSIS — Z87891 Personal history of nicotine dependence: Secondary | ICD-10-CM | POA: Insufficient documentation

## 2023-07-08 DIAGNOSIS — K219 Gastro-esophageal reflux disease without esophagitis: Secondary | ICD-10-CM | POA: Diagnosis not present

## 2023-07-08 HISTORY — PX: IR ANGIOGRAM PELVIS SELECTIVE OR SUPRASELECTIVE: IMG661

## 2023-07-08 HISTORY — PX: IR US GUIDE VASC ACCESS LEFT: IMG2389

## 2023-07-08 HISTORY — PX: IR EMBO TUMOR ORGAN ISCHEMIA INFARCT INC GUIDE ROADMAPPING: IMG5449

## 2023-07-08 HISTORY — PX: IR US GUIDE VASC ACCESS RIGHT: IMG2390

## 2023-07-08 HISTORY — PX: IR ANGIOGRAM SELECTIVE EACH ADDITIONAL VESSEL: IMG667

## 2023-07-08 LAB — PROTIME-INR
INR: 1 (ref 0.8–1.2)
Prothrombin Time: 13.8 s (ref 11.4–15.2)

## 2023-07-08 LAB — CBC
HCT: 41.1 % (ref 39.0–52.0)
Hemoglobin: 14.2 g/dL (ref 13.0–17.0)
MCH: 31.7 pg (ref 26.0–34.0)
MCHC: 34.5 g/dL (ref 30.0–36.0)
MCV: 91.7 fL (ref 80.0–100.0)
Platelets: 204 10*3/uL (ref 150–400)
RBC: 4.48 MIL/uL (ref 4.22–5.81)
RDW: 13.4 % (ref 11.5–15.5)
WBC: 4.3 10*3/uL (ref 4.0–10.5)
nRBC: 0 % (ref 0.0–0.2)

## 2023-07-08 LAB — COMPREHENSIVE METABOLIC PANEL
ALT: 12 U/L (ref 0–44)
AST: 17 U/L (ref 15–41)
Albumin: 3.7 g/dL (ref 3.5–5.0)
Alkaline Phosphatase: 56 U/L (ref 38–126)
Anion gap: 10 (ref 5–15)
BUN: 17 mg/dL (ref 8–23)
CO2: 22 mmol/L (ref 22–32)
Calcium: 8.8 mg/dL — ABNORMAL LOW (ref 8.9–10.3)
Chloride: 108 mmol/L (ref 98–111)
Creatinine, Ser: 1.01 mg/dL (ref 0.61–1.24)
GFR, Estimated: >60 mL/min (ref >60)
Glucose, Bld: 66 mg/dL — ABNORMAL LOW (ref 70–99)
Potassium: 4.2 mmol/L (ref 3.5–5.1)
Sodium: 140 mmol/L (ref 135–145)
Total Bilirubin: 1.2 mg/dL (ref 0.0–1.2)
Total Protein: 6.4 g/dL — ABNORMAL LOW (ref 6.5–8.1)

## 2023-07-08 MED ORDER — FENTANYL CITRATE (PF) 100 MCG/2ML IJ SOLN
INTRAMUSCULAR | Status: AC
  Filled 2023-07-08: qty 2

## 2023-07-08 MED ORDER — LIDOCAINE HCL 1 % IJ SOLN
INTRAMUSCULAR | Status: AC
  Filled 2023-07-08: qty 20

## 2023-07-08 MED ORDER — HYDROCODONE-ACETAMINOPHEN 5-325 MG PO TABS
1.0000 | ORAL_TABLET | Freq: Four times a day (QID) | ORAL | 0 refills | Status: DC | PRN
  Filled 2023-07-08: qty 12, 3d supply, fill #0

## 2023-07-08 MED ORDER — MIDAZOLAM HCL 2 MG/2ML IJ SOLN
INTRAMUSCULAR | Status: AC
Start: 2023-07-08 — End: ?
  Filled 2023-07-08: qty 2

## 2023-07-08 MED ORDER — DEXAMETHASONE SODIUM PHOSPHATE 10 MG/ML IJ SOLN
8.0000 mg | Freq: Once | INTRAMUSCULAR | Status: AC
  Administered 2023-07-08: 8 mg via INTRAVENOUS
  Filled 2023-07-08: qty 1

## 2023-07-08 MED ORDER — SODIUM CHLORIDE 0.9 % IV SOLN: INTRAVENOUS | Status: DC

## 2023-07-08 MED ORDER — IOHEXOL 300 MG/ML  SOLN
100.0000 mL | Freq: Once | INTRAMUSCULAR | Status: AC | PRN
  Administered 2023-07-08: 42.5 mL via INTRA_ARTERIAL

## 2023-07-08 MED ORDER — DOCUSATE SODIUM 100 MG PO CAPS
100.0000 mg | ORAL_CAPSULE | Freq: Two times a day (BID) | ORAL | 0 refills | Status: DC
  Filled 2023-07-08: qty 10, 5d supply, fill #0

## 2023-07-08 MED ORDER — IOHEXOL 300 MG/ML  SOLN
50.0000 mL | Freq: Once | INTRAMUSCULAR | Status: AC | PRN
  Administered 2023-07-08: 42 mL via INTRA_ARTERIAL

## 2023-07-08 MED ORDER — METHYLPREDNISOLONE 4 MG PO TBPK
ORAL_TABLET | ORAL | 0 refills | Status: DC
  Filled 2023-07-08: qty 21, 6d supply, fill #0

## 2023-07-08 MED ORDER — FENTANYL CITRATE (PF) 100 MCG/2ML IJ SOLN
INTRAMUSCULAR | Status: AC | PRN
  Administered 2023-07-08 (×4): 25 ug via INTRAVENOUS
  Administered 2023-07-08 (×2): 50 ug via INTRAVENOUS

## 2023-07-08 MED ORDER — VERAPAMIL HCL 2.5 MG/ML IV SOLN
INTRAVENOUS | Status: AC | PRN
  Administered 2023-07-08: 1 mL via INTRA_ARTERIAL

## 2023-07-08 MED ORDER — SULFAMETHOXAZOLE-TRIMETHOPRIM 400-80 MG PO TABS
1.0000 | ORAL_TABLET | Freq: Once | ORAL | Status: AC
  Administered 2023-07-08: 1 via ORAL
  Filled 2023-07-08: qty 1

## 2023-07-08 MED ORDER — NITROGLYCERIN IN D5W 100-5 MCG/ML-% IV SOLN
INTRAVENOUS | Status: AC
  Filled 2023-07-08: qty 250

## 2023-07-08 MED ORDER — NAPROXEN 250 MG PO TABS
500.0000 mg | ORAL_TABLET | Freq: Once | ORAL | Status: AC
  Administered 2023-07-08: 500 mg via ORAL
  Filled 2023-07-08: qty 2

## 2023-07-08 MED ORDER — SODIUM CHLORIDE 0.9 % IV BOLUS
500.0000 mL | Freq: Once | INTRAVENOUS | Status: AC
  Administered 2023-07-08: 500 mL via INTRAVENOUS

## 2023-07-08 MED ORDER — MIDAZOLAM HCL 2 MG/2ML IJ SOLN
INTRAMUSCULAR | Status: AC
  Filled 2023-07-08: qty 2

## 2023-07-08 MED ORDER — CIPROFLOXACIN HCL 500 MG PO TABS
500.0000 mg | ORAL_TABLET | Freq: Two times a day (BID) | ORAL | 0 refills | Status: DC
  Filled 2023-07-08: qty 14, 7d supply, fill #0

## 2023-07-08 MED ORDER — MIDAZOLAM HCL 2 MG/2ML IJ SOLN
INTRAMUSCULAR | Status: AC | PRN
  Administered 2023-07-08 (×4): .5 mg via INTRAVENOUS
  Administered 2023-07-08: 1 mg via INTRAVENOUS
  Administered 2023-07-08 (×3): .5 mg via INTRAVENOUS

## 2023-07-08 MED ORDER — LIDOCAINE HCL 1 % IJ SOLN
20.0000 mL | Freq: Once | INTRAMUSCULAR | Status: AC
  Administered 2023-07-08: 4 mL via INTRADERMAL

## 2023-07-08 MED ORDER — NAPROXEN 500 MG PO TABS
500.0000 mg | ORAL_TABLET | Freq: Two times a day (BID) | ORAL | 0 refills | Status: DC
  Filled 2023-07-08: qty 14, 7d supply, fill #0

## 2023-07-08 MED ORDER — IOHEXOL 300 MG/ML  SOLN
50.0000 mL | Freq: Once | INTRAMUSCULAR | Status: AC | PRN
  Administered 2023-07-08: 42 mL

## 2023-07-08 MED ORDER — PHENAZOPYRIDINE HCL 100 MG PO TABS
100.0000 mg | ORAL_TABLET | Freq: Three times a day (TID) | ORAL | 0 refills | Status: DC | PRN
  Filled 2023-07-08: qty 21, 7d supply, fill #0

## 2023-07-08 MED ORDER — VERAPAMIL HCL 2.5 MG/ML IV SOLN
INTRAVENOUS | Status: AC
  Filled 2023-07-08: qty 2

## 2023-07-08 NOTE — Discharge Instructions (Signed)
Please call Interventional Radiology clinic [phone] with any questions or concerns.  You may remove your dressing and shower tomorrow.  After the procedure, it is common to have: Increased frequency of urination Mild pressure or discomfort in the low belly Blood in the urine and/or painful urination for 7-14 days  Blood in the semen for 7-21 days Pressure, pain, or the urge to have a bowel movement when urinating and/or a small amount of blood in the stool for 4-7 days Low-grade fever (temperature less than 101.5 degrees Fahrenheit or 38.6 degrees Celsius) for 4-7 days Tenderness and/or bruising around the puncture site. Bruising can take 2-3 weeks to go away completely. Please call immediately if you feel a lump that is growing or varies with your pulse  Follow these instructions at home:  Medication: Do not use Aspirin or ibuprofen products, such as Advil or Motrin, as it may increase bleeding You may resume your usual medications as ordered by your doctor If your doctor prescribed antibiotics, take them as directed. Do not stop taking them just because you feel better  Eating and drinking: Drink plenty of liquids to keep your urine pale yellow You can resume your regular diet as directed by your doctor   Activity For procedures where we entered your artery from the top of your leg (groin):  Avoid strenuous activity, such as climbing long flights of stairs, for 24 hours after your procedure  No heavy lifting (greater than 15-20 pounds or 6-9 kg) for 7 days or until the puncture site heels Do not take baths, swim, or use a hot tub until your health care provider approves. Take showers only Keep all follow-up visits as told by your doctor  Care of the procedure site Follow instructions from your health care provider about how to take care of the puncture site.  Wash your hands with soap and water before you change your bandage (dressing). If soap and water are not available, use  hand sanitizer Change your dressing as told by your health care provider Leave stitches (sutures), skin glue, or adhesive strips in place. These skin closures may need to stay in place for 2 weeks or longer. If adhesive strip edges start to loosen and curl up, you may trim the loose edges. Do not remove adhesive strips completely unless your health care provider tells you to do that Check your puncture site every day for signs of infection. Check for: More redness, swelling, or pain Warmth/heat at puncture site Pus or a bad odor  Contact a health care provider if: You have a fever You have more redness, swelling, or pain around your incision You have more fluid or blood coming from your incision site Your incision feels warm to the touch You have pus or a bad smell coming from your incision You have a rash You have nausea, or you cannot eat or drink anything without vomiting  Get help right away if: You have trouble breathing You have chest pain You have severe pain in your abdomen, and it does not get better with medicine You have leg pain or leg swelling You feel dizzy, or you faint   Moderate Conscious Sedation-Care After  This sheet gives you information about how to care for yourself after your procedure. Your health care provider may also give you more specific instructions. If you have problems or questions, contact your health care provider.  After the procedure, it is common to have: Sleepiness for several hours. Impaired judgment for several hours.  Difficulty with balance. Vomiting if you eat too soon.  Follow these instructions at home:  Rest. Do not participate in activities where you could fall or become injured. Do not drive or use machinery. Do not drink alcohol. Do not take sleeping pills or medicines that cause drowsiness. Do not make important decisions or sign legal documents. Do not take care of children on your own.  Eating and drinking Follow the  diet recommended by your health care provider. Drink enough fluid to keep your urine pale yellow. If you vomit: Drink water, juice, or soup when you can drink without vomiting. Make sure you have little or no nausea before eating solid foods.  General instructions Take over-the-counter and prescription medicines only as told by your health care provider. Have a responsible adult stay with you for the time you are told. It is important to have someone help care for you until you are awake and alert. Do not smoke. Keep all follow-up visits as told by your health care provider. This is important.  Contact a health care provider if: You are still sleepy or having trouble with balance after 24 hours. You feel light-headed. You keep feeling nauseous or you keep vomiting. You develop a rash. You have a fever. You have redness or swelling around the IV site.  Get help right away if: You have trouble breathing. You have new-onset confusion at home.  This information is not intended to replace advice given to you by your health care provider. Make sure you discuss any questions you have with your healthcare provider.

## 2023-07-08 NOTE — Procedures (Signed)
Interventional Radiology Procedure Note  Procedure:   US guided right CFA access.  Closure with CELT Pelvic angiogram Treatment of right prostate artery with embosphere embolization.   Findings: Left prostate artery was spasm/stenotic, not treated Right prostate artery origin from the int pudendal (type 2).  Treated to stasis   Complications: None  Recommendations:  - 90 minutes dc home (CELT deployed for hemostasis) - advance diet - ok to restart all home meds - remove foley now - Rx's at pharmacy: naproxen, vesicare, pyridium, dulcolax/colace - Do not submerge for 7 days - Routine wound care  - clinic with Dr. Loreta Ave in ~4 weeks  Signed,  Yvone Neu. Loreta Ave, DO

## 2023-07-09 ENCOUNTER — Other Ambulatory Visit: Payer: Self-pay | Admitting: Interventional Radiology

## 2023-07-09 ENCOUNTER — Other Ambulatory Visit (HOSPITAL_COMMUNITY): Payer: Self-pay | Admitting: Radiology

## 2023-07-09 DIAGNOSIS — N401 Enlarged prostate with lower urinary tract symptoms: Secondary | ICD-10-CM

## 2023-07-11 DIAGNOSIS — D044 Carcinoma in situ of skin of scalp and neck: Secondary | ICD-10-CM | POA: Diagnosis not present

## 2023-07-11 DIAGNOSIS — D2261 Melanocytic nevi of right upper limb, including shoulder: Secondary | ICD-10-CM | POA: Diagnosis not present

## 2023-07-11 DIAGNOSIS — D2271 Melanocytic nevi of right lower limb, including hip: Secondary | ICD-10-CM | POA: Diagnosis not present

## 2023-07-11 DIAGNOSIS — D485 Neoplasm of uncertain behavior of skin: Secondary | ICD-10-CM | POA: Diagnosis not present

## 2023-07-11 DIAGNOSIS — D225 Melanocytic nevi of trunk: Secondary | ICD-10-CM | POA: Diagnosis not present

## 2023-07-11 DIAGNOSIS — L821 Other seborrheic keratosis: Secondary | ICD-10-CM | POA: Diagnosis not present

## 2023-07-11 DIAGNOSIS — C4441 Basal cell carcinoma of skin of scalp and neck: Secondary | ICD-10-CM | POA: Diagnosis not present

## 2023-07-11 DIAGNOSIS — L570 Actinic keratosis: Secondary | ICD-10-CM | POA: Diagnosis not present

## 2023-07-11 DIAGNOSIS — D2262 Melanocytic nevi of left upper limb, including shoulder: Secondary | ICD-10-CM | POA: Diagnosis not present

## 2023-07-11 DIAGNOSIS — D2272 Melanocytic nevi of left lower limb, including hip: Secondary | ICD-10-CM | POA: Diagnosis not present

## 2023-07-26 ENCOUNTER — Ambulatory Visit
Admission: RE | Admit: 2023-07-26 | Discharge: 2023-07-26 | Disposition: A | Discharge status: Home / Self Care | Payer: Medicare Other | Source: Ambulatory Visit | Attending: Interventional Radiology | Admitting: Interventional Radiology

## 2023-07-26 DIAGNOSIS — N401 Enlarged prostate with lower urinary tract symptoms: Secondary | ICD-10-CM

## 2023-07-26 DIAGNOSIS — R351 Nocturia: Secondary | ICD-10-CM | POA: Diagnosis not present

## 2023-07-26 DIAGNOSIS — R32 Unspecified urinary incontinence: Secondary | ICD-10-CM | POA: Diagnosis not present

## 2023-07-26 HISTORY — PX: IR RADIOLOGIST EVAL & MGMT: IMG5224

## 2023-07-26 NOTE — Progress Notes (Signed)
 Chief Complaint: Lower urinary tract symptoms   Referring Physician(s): Wrenn,John   PCP: Dr. Merri Brunette     History of Present Illness: [name] is a 88 y.o. male presenting as scheduled post op follow up for prostate artery embolization as therapy.    [name] by way of telemedicine.  We confirmed his identity with 2 personal identifiers.    History:  We met [name] 04/10/23.  He  states he has been seeing Dr. Annabell Howells and his office for a long time.  He tells me he has wanted to "gradually escalate his care with these symptoms."  According to Dr. Belva Crome notes, he had a Urolift performed 05/2018.  [name] tells me that he has occasional incontinence during the day and night.  Does not seem to be stress incontinence.     he scores 2,3,2,3,3,1,2 = 16 on the IPSS.  He has previously scored 18.  His quality of life is recorded as 3/Mixed.    He denies any hematuria or recurrent UTI.  He denies any prior episode of acute urinary retention.    He has had urodynamics performed with [facility].    He continues currently on silodosin and also takes gemtesa.    He denies any prior MI or stroke.  He has care with both cardiology and pulmonology.   Interval history: We treated [name] 07/08/23 with PAE.  The right was treated with embo, with some crossover to the left via collateral flow.  The left PA was spastic/occluded.   He tells me that he had no problems what so ever in his recovery.  He denies any blood in his urine, blood in his stool, or pain.  He is feeling better already about his voiding, with his main improvement, he says, with only 1-2 episodes of nocturia at night now, previously at least 2-3 episodes.  He denies any problems of bruising or lumps at the access site.  Denies any other new problems.  He does still have some episodic incontinence, which is unchanged, and seems to be overflow type based on his history.   Past Medical History:   Diagnosis Date   Allergic rhinitis    Asthma, mild last used prn inhaler 2 yrs ago   runs 10 miles weekly---   FOLLOWED BY DR Joni Fears YOUNG   Frequency of urination    H/O bronchitis SEASONAL    History of gastroesophageal reflux (GERD) WATCHES DIET , NO PROBLEMS FOR A WHILE   History of kidney stones    Hx of tension headache    Hypertension    Mild sleep apnea USES BREATH-RITE STRIPS   NO CPAP   White coat hypertension     Past Surgical History:  Procedure Laterality Date   ANTERIOR CERVICAL DECOMP/DISCECTOMY FUSION  06/11/2005   BLEPHAROPLASTY  06/12/2003   LEFT UPPER EYELID   HYDROCELE EXCISION  10/08/2011   Procedure: HYDROCELECTOMY ADULT;  Surgeon: Valetta Fuller, MD;  Location: [facility];  Service: Urology;  Laterality: Left;   IR ANGIOGRAM PELVIS SELECTIVE OR SUPRASELECTIVE  07/08/2023   IR ANGIOGRAM SELECTIVE EACH ADDITIONAL VESSEL  07/08/2023   IR ANGIOGRAM SELECTIVE EACH ADDITIONAL VESSEL  07/08/2023   IR EMBO TUMOR ORGAN ISCHEMIA INFARCT INC GUIDE ROADMAPPING  07/08/2023   IR RADIOLOGIST EVAL & MGMT  04/10/2023   IR US GUIDE VASC ACCESS LEFT  07/08/2023   IR US GUIDE VASC ACCESS LEFT  07/08/2023   IR US  GUIDE VASC ACCESS RIGHT  07/08/2023   LUMBAR FUSION  03/11/2004   ROTATOR CUFF REPAIR Right 04/18/2023   SPINE SURGERY     cervial and lumbar    Allergies: Codeine and Penicillins  Medications: Prior to Admission medications   Medication Sig Start Date End Date Taking? Authorizing Provider  albuterol (VENTOLIN HFA) 108 (90 Base) MCG/ACT inhaler Inhale 1-2 puffs into the lungs every 6 (six) hours as needed. 02/20/23   Parrett, Virgel Bouquet, NP  ascorbic acid (VITAMIN C) 1000 MG tablet Take 1,000 mg by mouth daily.    [provider]  aspirin 81 MG EC tablet Take 81 mg by mouth daily.    [provider]  budesonide-formoterol (SYMBICORT) 160-4.5 MCG/ACT inhaler Inhale 2 puffs into the lungs 2 (two) times daily. 02/20/23   Parrett, Virgel Bouquet,  NP  Cholecalciferol 125 MCG (5000 UT) capsule Take 5,000 Units by mouth daily.    [provider]  ciprofloxacin (CIPRO) 500 MG tablet Take 1 tablet (500 mg total) by mouth 2 (two) times daily. 07/08/23   Allred, Darrell K, PA-C  docusate sodium (COLACE) 100 MG capsule Take 1 capsule (100 mg total) by mouth 2 (two) times daily. 07/08/23   Allred, Rosalita Levan, PA-C  HYDROcodone-acetaminophen (NORCO) 5-325 MG tablet Take 1 tablet by mouth every 6 (six) hours as needed for moderate pain (pain score 4-6). 07/08/23   Allred, Darrell K, PA-C  losartan (COZAAR) 25 MG tablet Take 25 mg by mouth daily.    [provider]  methimazole (TAPAZOLE) 5 MG tablet Take 5 mg by mouth daily. 10/17/20   [provider]  methylPREDNISolone (MEDROL DOSEPAK) 4 MG TBPK tablet Take as directed per package instructions. 07/08/23   Allred, Darrell K, PA-C  naproxen (NAPROSYN) 500 MG tablet Take 1 tablet (500 mg total) by mouth 2 (two) times daily with a meal. 07/08/23   Allred, Darrell K, PA-C  niacin 500 MG tablet Take 500 mg by mouth at bedtime.    [provider]  omeprazole (PRILOSEC) 40 MG capsule Take 40 mg by mouth daily.    [provider]  phenazopyridine (PYRIDIUM) 100 MG tablet Take 1 tablet (100 mg total) by mouth 3 (three) times daily as needed for urinary pain. 07/08/23   Allred, Rosalita Levan, PA-C  silodosin (RAPAFLO) 8 MG CAPS capsule Take 8 mg by mouth daily with breakfast.    [provider]  Vibegron (GEMTESA) 75 MG TABS Take 75 mg by mouth daily.    [provider]     Family History  Problem Relation Age of Onset   Cancer Mother    Cancer Father    Asthma Daughter     Social History   Socioeconomic History   Marital status: Married    Spouse name: Not on file   Number of children: 1   Years of education: Not on file   Highest education level: Not on file  Occupational History   Occupation: former Forensic scientist or [employer]  Tobacco Use   Smoking  status: Former    Current packs/day: 0.00    Types: Cigarettes    Start date: 06/11/1956    Quit date: 06/11/1974    Years since quitting: 49.1    Passive exposure: Never   Smokeless tobacco: Never  Vaping Use   Vaping status: Never Used  Substance and Sexual Activity   Alcohol use: Yes    Alcohol/week: 1.0 standard drink of alcohol    Types: 1 Cans of  beer per week    Comment: 3-4 beers a week   Drug use: Never   Sexual activity: Yes  Other Topics Concern   Not on file  Social History Narrative   caffeine - 1 cup daily   Exercise - 2-3 times weekly   Positive history passive tobacco exposure   Social Drivers of Corporate investment banker Strain: Not on file  Food Insecurity: Not on file  Transportation Needs: Not on file  Physical Activity: Not on file  Stress: Not on file  Social Connections: Not on file       Review of Systems  Review of Systems: A 12 point ROS discussed and pertinent positives are indicated in the HPI above.  All other systems are negative.      Physical Exam No direct physical exam was performed (except for noted visual exam findings with Video Visits).    Vital Signs: There were no vitals taken for this visit.  Imaging: IR EMBO TUMOR ORGAN ISCHEMIA INFARCT INC GUIDE ROADMAPPING Result Date: 07/09/2023 INDICATION: 89-YEAR-OLD MALE WITH LOWER URINARY TRACT SYMPTOMS SECONDARY TO BPH PRESENTS FOR PROSTATIC ARTERY EMBOLIZATION. EXAM: ULTRASOUND-GUIDED ACCESS RIGHT COMMON FEMORAL ARTERY PELVIC ANGIOGRAM FLAT PANEL CT SCANS FOR CATHETER POSITIONING SUPER SELECTIVE CATHETER PLACEMENT INTO THE LEFT INTERNAL PUDENDAL ARTERY SUPER SELECTIVE CATHETER PLACEMENT INTO THE RIGHT INTERNAL ILIAC ARTERY, ANTERIOR DIVISION, PROSTATIC ARTERY BEAD EMBOLIZATION OF THE RIGHT PROSTATIC ARTERY TO STASIS CELT FOR HEMOSTASIS. MEDICATIONS: 500 mg IV Cipro. The antibiotic was administered within 1 hour of the procedure ANESTHESIA/SEDATION: Moderate (conscious) sedation was  employed during this procedure. A total of Versed 4.5 mg and Fentanyl 200 mcg was administered intravenously by the radiology nurse. Total intra-service moderate Sedation Time: 141 minutes. The patient's level of consciousness and vital signs were monitored continuously by radiology nursing throughout the procedure under my direct supervision. CONTRAST:  130 cc FLUOROSCOPY: Radiation Exposure Index (as provided by the fluoroscopic device): 3,656 mGy Kerma COMPLICATIONS: None PROCEDURE: Informed consent was obtained from the patient following explanation of the procedure, risks, benefits and alternatives. The patient understands, agrees and consents for the procedure. All questions were addressed. A time out was performed prior to the initiation of the procedure. Maximal barrier sterile technique utilized including caps, mask, sterile gowns, sterile gloves, large sterile drape, hand hygiene, and Betadine prep. Ultrasound survey of the right inguinal region was performed with images stored and sent to PACs, confirming patency of the vessel. A micropuncture needle was used access the right common femoral artery under ultrasound. With excellent arterial blood flow returned, and an .018 micro wire was passed through the needle, observed enter the abdominal aorta under fluoroscopy. The needle was removed, and a micropuncture sheath was placed over the wire. The inner dilator and wire were removed, and an 035 Bentson wire was advanced under fluoroscopy into the abdominal aorta. The sheath was removed and a standard 5 [place] vascular sheath was placed. The dilator was removed and the sheath was flushed. Omni Flush catheter was advanced into the abdominal aorta. Angiogram was performed. The Omni Flush catheter was used in attempting to pass the Bentson wire into the external iliac artery. Given the tortuosity, the wire only passed into the internal iliac artery. Attempted to form the prostate catheter into the internal iliac  which failed in prolapse into the aorta. Exchange was then made for the Omni Flush catheter on the Bentson wire. Omni Flush catheter was passed over the aortic bifurcation. A Glidewire was passed into the external  iliac artery, common femoral artery, superficial femoral artery. The Omni Flush catheter was removed and a 4 French glide cath was then passed into the distal external iliac artery. Glidewire was removed and the Bentson wire was placed into the common femoral artery. The prostate catheter was then formed over the bifurcation. Once the curve was formed the catheter was withdrawn. The wire was withdrawn, and the tip of the catheter was used to select the left internal iliac artery. With the catheter in position in the internal iliac artery, angiogram was performed. Multiple obliquities were performed including the typical 40-45 degrees ipsilateral oblique image with 10 degrees of caudal-cranial angulation. After this angiogram was assessed, we identified what we perceived to be the prostatic artery from the anterior division. The majority of the fluoroscopic time was then used, attempting 2 achieve a super selective catheter position into this perceived prostatic artery. A combination of a Progreat microcatheter, terumo angled microcatheter, 014 fathom wire, aristotle wire, synchro soft wire, double angle glide wire, synchro 010 were used in attempt to access the target artery. Flat panel CT imaging was performed from the internal iliac artery and reviewed. Ultimately, we were unable to engage the origin of this presumed prostatic artery. We elected to proceed to the right. Microcatheter and microwire were removed. The prostate base catheter was withdrawn to the aorta and used to select the right-sided internal iliac artery. Angiogram was performed. Under roadmap angiogram, the microwire and microcatheter were used to select the right-sided prostatic artery. Angiogram was performed. Flat panel CT imaging was  performed to confirm location. 2.5 mg intra arterial verapamil was slowly administered. We then proceeded with embosphere embolization using 100-300 calibrated beads. Stasis was achieved. Final images were performed. Catheters and wires were removed. Celt was deployed for hemostasis. Patient tolerated the procedure well and remained hemodynamically stable throughout. No complications were encountered and no significant blood loss. FINDINGS: Ultrasound of the right inguinal region demonstrates patent common femoral artery with high bifurcation. Minimal atherosclerosis. Pelvic angiogram demonstrates acute angle of the aortic bifurcation with no significant atherosclerotic changes of the iliac system. Moderate tortuosity of the iliac system. Early takeoff of the bilateral internal iliac artery. On the left, the internal iliac artery is of a traditional type a Yamaki class. What was identified as the left prostatic artery and ultimately was spasm/occlusion arose from the proximal gluteal pudendal trunk, near the origin of superior vesicle arteries, apparently type 2, although this was unproven as the super selective catheter position was unsuccessful. The flat panel CT of the left internal iliac artery is suggestive of a type 2 classification from the anterior division although the origin appeared occluded/spastic. Majority of the fluoroscopy time was used on the left attempting to identify the artery. Angiogram of the right internal iliac artery demonstrates a type Yamaki class, with common gluteal trunk. The prostate artery arises from the mid segment of the internal pudendal artery, type 4 carnavale. Cone beam CT with the catheter in the prostatic artery demonstrates single prostatic artery covering the right hemi gland and portions of the left hemi gland, with no significant cross contamination from the distal vasculature. Embolization was performed to stasis IMPRESSION: Status post ultrasound guided access right  common femoral artery for pelvic angiogram, super selective catheter position into the anterior division of the left internal iliac artery, super selective catheter position into the right prostatic artery, and embolization of the right prostatic artery for treatment of LUTS. The left prostatic artery was either diseased, spastic, or  dissected and was not confidently identified. Celt for hemostasis Signed, Yvone Neu. Miachel Roux, RPVI [facility] Radiology Electronically Signed   By: Gilmer Mor D.O.   On: 07/09/2023 08:10   IR US Guide Vasc Access Right Result Date: 07/09/2023 INDICATION: 41-YEAR-OLD MALE WITH LOWER URINARY TRACT SYMPTOMS SECONDARY TO BPH PRESENTS FOR PROSTATIC ARTERY EMBOLIZATION. EXAM: ULTRASOUND-GUIDED ACCESS RIGHT COMMON FEMORAL ARTERY PELVIC ANGIOGRAM FLAT PANEL CT SCANS FOR CATHETER POSITIONING SUPER SELECTIVE CATHETER PLACEMENT INTO THE LEFT INTERNAL PUDENDAL ARTERY SUPER SELECTIVE CATHETER PLACEMENT INTO THE RIGHT INTERNAL ILIAC ARTERY, ANTERIOR DIVISION, PROSTATIC ARTERY BEAD EMBOLIZATION OF THE RIGHT PROSTATIC ARTERY TO STASIS CELT FOR HEMOSTASIS. MEDICATIONS: 500 mg IV Cipro. The antibiotic was administered within 1 hour of the procedure ANESTHESIA/SEDATION: Moderate (conscious) sedation was employed during this procedure. A total of Versed 4.5 mg and Fentanyl 200 mcg was administered intravenously by the radiology nurse. Total intra-service moderate Sedation Time: 141 minutes. The patient's level of consciousness and vital signs were monitored continuously by radiology nursing throughout the procedure under my direct supervision. CONTRAST:  130 cc FLUOROSCOPY: Radiation Exposure Index (as provided by the fluoroscopic device): 3,656 mGy Kerma COMPLICATIONS: None PROCEDURE: Informed consent was obtained from the patient following explanation of the procedure, risks, benefits and alternatives. The patient understands, agrees and  consents for the procedure. All questions were addressed. A time out was performed prior to the initiation of the procedure. Maximal barrier sterile technique utilized including caps, mask, sterile gowns, sterile gloves, large sterile drape, hand hygiene, and Betadine prep. Ultrasound survey of the right inguinal region was performed with images stored and sent to PACs, confirming patency of the vessel. A micropuncture needle was used access the right common femoral artery under ultrasound. With excellent arterial blood flow returned, and an .018 micro wire was passed through the needle, observed enter the abdominal aorta under fluoroscopy. The needle was removed, and a micropuncture sheath was placed over the wire. The inner dilator and wire were removed, and an 035 Bentson wire was advanced under fluoroscopy into the abdominal aorta. The sheath was removed and a standard 5 [place] vascular sheath was placed. The dilator was removed and the sheath was flushed. Omni Flush catheter was advanced into the abdominal aorta. Angiogram was performed. The Omni Flush catheter was used in attempting to pass the Bentson wire into the external iliac artery. Given the tortuosity, the wire only passed into the internal iliac artery. Attempted to form the prostate catheter into the internal iliac which failed in prolapse into the aorta. Exchange was then made for the Omni Flush catheter on the Bentson wire. Omni Flush catheter was passed over the aortic bifurcation. A Glidewire was passed into the external iliac artery, common femoral artery, superficial femoral artery. The Omni Flush catheter was removed and a 4 French glide cath was then passed into the distal external iliac artery. Glidewire was removed and the Bentson wire was placed into the common femoral artery. The prostate catheter was then formed over the bifurcation. Once the curve was formed the catheter was withdrawn. The wire was withdrawn, and the tip of the catheter  was used to select the left internal iliac artery. With the catheter in position in the internal iliac artery, angiogram was performed. Multiple obliquities were performed including the typical 40-45 degrees ipsilateral oblique image with 10 degrees of caudal-cranial angulation. After this angiogram was assessed, we identified what we perceived to be the prostatic artery from the anterior division. The majority  of the fluoroscopic time was then used, attempting 2 achieve a super selective catheter position into this perceived prostatic artery. A combination of a Progreat microcatheter, terumo angled microcatheter, 014 fathom wire, aristotle wire, synchro soft wire, double angle glide wire, synchro 010 were used in attempt to access the target artery. Flat panel CT imaging was performed from the internal iliac artery and reviewed. Ultimately, we were unable to engage the origin of this presumed prostatic artery. We elected to proceed to the right. Microcatheter and microwire were removed. The prostate base catheter was withdrawn to the aorta and used to select the right-sided internal iliac artery. Angiogram was performed. Under roadmap angiogram, the microwire and microcatheter were used to select the right-sided prostatic artery. Angiogram was performed. Flat panel CT imaging was performed to confirm location. 2.5 mg intra arterial verapamil was slowly administered. We then proceeded with embosphere embolization using 100-300 calibrated beads. Stasis was achieved. Final images were performed. Catheters and wires were removed. Celt was deployed for hemostasis. Patient tolerated the procedure well and remained hemodynamically stable throughout. No complications were encountered and no significant blood loss. FINDINGS: Ultrasound of the right inguinal region demonstrates patent common femoral artery with high bifurcation. Minimal atherosclerosis. Pelvic angiogram demonstrates acute angle of the aortic bifurcation with  no significant atherosclerotic changes of the iliac system. Moderate tortuosity of the iliac system. Early takeoff of the bilateral internal iliac artery. On the left, the internal iliac artery is of a traditional type a Yamaki class. What was identified as the left prostatic artery and ultimately was spasm/occlusion arose from the proximal gluteal pudendal trunk, near the origin of superior vesicle arteries, apparently type 2, although this was unproven as the super selective catheter position was unsuccessful. The flat panel CT of the left internal iliac artery is suggestive of a type 2 classification from the anterior division although the origin appeared occluded/spastic. Majority of the fluoroscopy time was used on the left attempting to identify the artery. Angiogram of the right internal iliac artery demonstrates a type Yamaki class, with common gluteal trunk. The prostate artery arises from the mid segment of the internal pudendal artery, type 4 carnavale. Cone beam CT with the catheter in the prostatic artery demonstrates single prostatic artery covering the right hemi gland and portions of the left hemi gland, with no significant cross contamination from the distal vasculature. Embolization was performed to stasis IMPRESSION: Status post ultrasound guided access right common femoral artery for pelvic angiogram, super selective catheter position into the anterior division of the left internal iliac artery, super selective catheter position into the right prostatic artery, and embolization of the right prostatic artery for treatment of LUTS. The left prostatic artery was either diseased, spastic, or dissected and was not confidently identified. Celt for hemostasis Signed, Yvone Neu. Miachel Roux, RPVI [facility] Radiology Electronically Signed   By: Gilmer Mor D.O.   On: 07/09/2023 08:10   IR Angiogram Pelvis Selective Or Supraselective Result Date:  07/09/2023 INDICATION: 75-YEAR-OLD MALE WITH LOWER URINARY TRACT SYMPTOMS SECONDARY TO BPH PRESENTS FOR PROSTATIC ARTERY EMBOLIZATION. EXAM: ULTRASOUND-GUIDED ACCESS RIGHT COMMON FEMORAL ARTERY PELVIC ANGIOGRAM FLAT PANEL CT SCANS FOR CATHETER POSITIONING SUPER SELECTIVE CATHETER PLACEMENT INTO THE LEFT INTERNAL PUDENDAL ARTERY SUPER SELECTIVE CATHETER PLACEMENT INTO THE RIGHT INTERNAL ILIAC ARTERY, ANTERIOR DIVISION, PROSTATIC ARTERY BEAD EMBOLIZATION OF THE RIGHT PROSTATIC ARTERY TO STASIS CELT FOR HEMOSTASIS. MEDICATIONS: 500 mg IV Cipro. The antibiotic was administered within 1 hour of the procedure ANESTHESIA/SEDATION:  Moderate (conscious) sedation was employed during this procedure. A total of Versed 4.5 mg and Fentanyl 200 mcg was administered intravenously by the radiology nurse. Total intra-service moderate Sedation Time: 141 minutes. The patient's level of consciousness and vital signs were monitored continuously by radiology nursing throughout the procedure under my direct supervision. CONTRAST:  130 cc FLUOROSCOPY: Radiation Exposure Index (as provided by the fluoroscopic device): 3,656 mGy Kerma COMPLICATIONS: None PROCEDURE: Informed consent was obtained from the patient following explanation of the procedure, risks, benefits and alternatives. The patient understands, agrees and consents for the procedure. All questions were addressed. A time out was performed prior to the initiation of the procedure. Maximal barrier sterile technique utilized including caps, mask, sterile gowns, sterile gloves, large sterile drape, hand hygiene, and Betadine prep. Ultrasound survey of the right inguinal region was performed with images stored and sent to PACs, confirming patency of the vessel. A micropuncture needle was used access the right common femoral artery under ultrasound. With excellent arterial blood flow returned, and an .018 micro wire was passed through the needle, observed enter the abdominal aorta under  fluoroscopy. The needle was removed, and a micropuncture sheath was placed over the wire. The inner dilator and wire were removed, and an 035 Bentson wire was advanced under fluoroscopy into the abdominal aorta. The sheath was removed and a standard 5 [place] vascular sheath was placed. The dilator was removed and the sheath was flushed. Omni Flush catheter was advanced into the abdominal aorta. Angiogram was performed. The Omni Flush catheter was used in attempting to pass the Bentson wire into the external iliac artery. Given the tortuosity, the wire only passed into the internal iliac artery. Attempted to form the prostate catheter into the internal iliac which failed in prolapse into the aorta. Exchange was then made for the Omni Flush catheter on the Bentson wire. Omni Flush catheter was passed over the aortic bifurcation. A Glidewire was passed into the external iliac artery, common femoral artery, superficial femoral artery. The Omni Flush catheter was removed and a 4 French glide cath was then passed into the distal external iliac artery. Glidewire was removed and the Bentson wire was placed into the common femoral artery. The prostate catheter was then formed over the bifurcation. Once the curve was formed the catheter was withdrawn. The wire was withdrawn, and the tip of the catheter was used to select the left internal iliac artery. With the catheter in position in the internal iliac artery, angiogram was performed. Multiple obliquities were performed including the typical 40-45 degrees ipsilateral oblique image with 10 degrees of caudal-cranial angulation. After this angiogram was assessed, we identified what we perceived to be the prostatic artery from the anterior division. The majority of the fluoroscopic time was then used, attempting 2 achieve a super selective catheter position into this perceived prostatic artery. A combination of a Progreat microcatheter, terumo angled microcatheter, 014 fathom  wire, aristotle wire, synchro soft wire, double angle glide wire, synchro 010 were used in attempt to access the target artery. Flat panel CT imaging was performed from the internal iliac artery and reviewed. Ultimately, we were unable to engage the origin of this presumed prostatic artery. We elected to proceed to the right. Microcatheter and microwire were removed. The prostate base catheter was withdrawn to the aorta and used to select the right-sided internal iliac artery. Angiogram was performed. Under roadmap angiogram, the microwire and microcatheter were used to select the right-sided prostatic artery. Angiogram was performed. Flat panel CT  imaging was performed to confirm location. 2.5 mg intra arterial verapamil was slowly administered. We then proceeded with embosphere embolization using 100-300 calibrated beads. Stasis was achieved. Final images were performed. Catheters and wires were removed. Celt was deployed for hemostasis. Patient tolerated the procedure well and remained hemodynamically stable throughout. No complications were encountered and no significant blood loss. FINDINGS: Ultrasound of the right inguinal region demonstrates patent common femoral artery with high bifurcation. Minimal atherosclerosis. Pelvic angiogram demonstrates acute angle of the aortic bifurcation with no significant atherosclerotic changes of the iliac system. Moderate tortuosity of the iliac system. Early takeoff of the bilateral internal iliac artery. On the left, the internal iliac artery is of a traditional type a Yamaki class. What was identified as the left prostatic artery and ultimately was spasm/occlusion arose from the proximal gluteal pudendal trunk, near the origin of superior vesicle arteries, apparently type 2, although this was unproven as the super selective catheter position was unsuccessful. The flat panel CT of the left internal iliac artery is suggestive of a type 2 classification from the anterior  division although the origin appeared occluded/spastic. Majority of the fluoroscopy time was used on the left attempting to identify the artery. Angiogram of the right internal iliac artery demonstrates a type Yamaki class, with common gluteal trunk. The prostate artery arises from the mid segment of the internal pudendal artery, type 4 carnavale. Cone beam CT with the catheter in the prostatic artery demonstrates single prostatic artery covering the right hemi gland and portions of the left hemi gland, with no significant cross contamination from the distal vasculature. Embolization was performed to stasis IMPRESSION: Status post ultrasound guided access right common femoral artery for pelvic angiogram, super selective catheter position into the anterior division of the left internal iliac artery, super selective catheter position into the right prostatic artery, and embolization of the right prostatic artery for treatment of LUTS. The left prostatic artery was either diseased, spastic, or dissected and was not confidently identified. Celt for hemostasis Signed, Yvone Neu. Miachel Roux, RPVI [facility] Radiology Electronically Signed   By: Gilmer Mor D.O.   On: 07/09/2023 08:10   IR Angiogram Selective Each Additional Vessel Result Date: 07/09/2023 INDICATION: 19-YEAR-OLD MALE WITH LOWER URINARY TRACT SYMPTOMS SECONDARY TO BPH PRESENTS FOR PROSTATIC ARTERY EMBOLIZATION. EXAM: ULTRASOUND-GUIDED ACCESS RIGHT COMMON FEMORAL ARTERY PELVIC ANGIOGRAM FLAT PANEL CT SCANS FOR CATHETER POSITIONING SUPER SELECTIVE CATHETER PLACEMENT INTO THE LEFT INTERNAL PUDENDAL ARTERY SUPER SELECTIVE CATHETER PLACEMENT INTO THE RIGHT INTERNAL ILIAC ARTERY, ANTERIOR DIVISION, PROSTATIC ARTERY BEAD EMBOLIZATION OF THE RIGHT PROSTATIC ARTERY TO STASIS CELT FOR HEMOSTASIS. MEDICATIONS: 500 mg IV Cipro. The antibiotic was administered within 1 hour of the procedure ANESTHESIA/SEDATION:  Moderate (conscious) sedation was employed during this procedure. A total of Versed 4.5 mg and Fentanyl 200 mcg was administered intravenously by the radiology nurse. Total intra-service moderate Sedation Time: 141 minutes. The patient's level of consciousness and vital signs were monitored continuously by radiology nursing throughout the procedure under my direct supervision. CONTRAST:  130 cc FLUOROSCOPY: Radiation Exposure Index (as provided by the fluoroscopic device): 3,656 mGy Kerma COMPLICATIONS: None PROCEDURE: Informed consent was obtained from the patient following explanation of the procedure, risks, benefits and alternatives. The patient understands, agrees and consents for the procedure. All questions were addressed. A time out was performed prior to the initiation of the procedure. Maximal barrier sterile technique utilized including caps, mask, sterile gowns, sterile gloves, large sterile drape, hand hygiene, and  Betadine prep. Ultrasound survey of the right inguinal region was performed with images stored and sent to PACs, confirming patency of the vessel. A micropuncture needle was used access the right common femoral artery under ultrasound. With excellent arterial blood flow returned, and an .018 micro wire was passed through the needle, observed enter the abdominal aorta under fluoroscopy. The needle was removed, and a micropuncture sheath was placed over the wire. The inner dilator and wire were removed, and an 035 Bentson wire was advanced under fluoroscopy into the abdominal aorta. The sheath was removed and a standard 5 [place] vascular sheath was placed. The dilator was removed and the sheath was flushed. Omni Flush catheter was advanced into the abdominal aorta. Angiogram was performed. The Omni Flush catheter was used in attempting to pass the Bentson wire into the external iliac artery. Given the tortuosity, the wire only passed into the internal iliac artery. Attempted to form the  prostate catheter into the internal iliac which failed in prolapse into the aorta. Exchange was then made for the Omni Flush catheter on the Bentson wire. Omni Flush catheter was passed over the aortic bifurcation. A Glidewire was passed into the external iliac artery, common femoral artery, superficial femoral artery. The Omni Flush catheter was removed and a 4 French glide cath was then passed into the distal external iliac artery. Glidewire was removed and the Bentson wire was placed into the common femoral artery. The prostate catheter was then formed over the bifurcation. Once the curve was formed the catheter was withdrawn. The wire was withdrawn, and the tip of the catheter was used to select the left internal iliac artery. With the catheter in position in the internal iliac artery, angiogram was performed. Multiple obliquities were performed including the typical 40-45 degrees ipsilateral oblique image with 10 degrees of caudal-cranial angulation. After this angiogram was assessed, we identified what we perceived to be the prostatic artery from the anterior division. The majority of the fluoroscopic time was then used, attempting 2 achieve a super selective catheter position into this perceived prostatic artery. A combination of a Progreat microcatheter, terumo angled microcatheter, 014 fathom wire, aristotle wire, synchro soft wire, double angle glide wire, synchro 010 were used in attempt to access the target artery. Flat panel CT imaging was performed from the internal iliac artery and reviewed. Ultimately, we were unable to engage the origin of this presumed prostatic artery. We elected to proceed to the right. Microcatheter and microwire were removed. The prostate base catheter was withdrawn to the aorta and used to select the right-sided internal iliac artery. Angiogram was performed. Under roadmap angiogram, the microwire and microcatheter were used to select the right-sided prostatic artery.  Angiogram was performed. Flat panel CT imaging was performed to confirm location. 2.5 mg intra arterial verapamil was slowly administered. We then proceeded with embosphere embolization using 100-300 calibrated beads. Stasis was achieved. Final images were performed. Catheters and wires were removed. Celt was deployed for hemostasis. Patient tolerated the procedure well and remained hemodynamically stable throughout. No complications were encountered and no significant blood loss. FINDINGS: Ultrasound of the right inguinal region demonstrates patent common femoral artery with high bifurcation. Minimal atherosclerosis. Pelvic angiogram demonstrates acute angle of the aortic bifurcation with no significant atherosclerotic changes of the iliac system. Moderate tortuosity of the iliac system. Early takeoff of the bilateral internal iliac artery. On the left, the internal iliac artery is of a traditional type a Yamaki class. What was identified as the left prostatic artery  and ultimately was spasm/occlusion arose from the proximal gluteal pudendal trunk, near the origin of superior vesicle arteries, apparently type 2, although this was unproven as the super selective catheter position was unsuccessful. The flat panel CT of the left internal iliac artery is suggestive of a type 2 classification from the anterior division although the origin appeared occluded/spastic. Majority of the fluoroscopy time was used on the left attempting to identify the artery. Angiogram of the right internal iliac artery demonstrates a type Yamaki class, with common gluteal trunk. The prostate artery arises from the mid segment of the internal pudendal artery, type 4 carnavale. Cone beam CT with the catheter in the prostatic artery demonstrates single prostatic artery covering the right hemi gland and portions of the left hemi gland, with no significant cross contamination from the distal vasculature. Embolization was performed to stasis  IMPRESSION: Status post ultrasound guided access right common femoral artery for pelvic angiogram, super selective catheter position into the anterior division of the left internal iliac artery, super selective catheter position into the right prostatic artery, and embolization of the right prostatic artery for treatment of LUTS. The left prostatic artery was either diseased, spastic, or dissected and was not confidently identified. Celt for hemostasis Signed, Yvone Neu. Miachel Roux, RPVI [facility] Radiology Electronically Signed   By: Gilmer Mor D.O.   On: 07/09/2023 08:10   IR CT PELVIS W/CM Result Date: 07/09/2023 INDICATION: 89-YEAR-OLD MALE WITH LOWER URINARY TRACT SYMPTOMS SECONDARY TO BPH PRESENTS FOR PROSTATIC ARTERY EMBOLIZATION. EXAM: ULTRASOUND-GUIDED ACCESS RIGHT COMMON FEMORAL ARTERY PELVIC ANGIOGRAM FLAT PANEL CT SCANS FOR CATHETER POSITIONING SUPER SELECTIVE CATHETER PLACEMENT INTO THE LEFT INTERNAL PUDENDAL ARTERY SUPER SELECTIVE CATHETER PLACEMENT INTO THE RIGHT INTERNAL ILIAC ARTERY, ANTERIOR DIVISION, PROSTATIC ARTERY BEAD EMBOLIZATION OF THE RIGHT PROSTATIC ARTERY TO STASIS CELT FOR HEMOSTASIS. MEDICATIONS: 500 mg IV Cipro. The antibiotic was administered within 1 hour of the procedure ANESTHESIA/SEDATION: Moderate (conscious) sedation was employed during this procedure. A total of Versed 4.5 mg and Fentanyl 200 mcg was administered intravenously by the radiology nurse. Total intra-service moderate Sedation Time: 141 minutes. The patient's level of consciousness and vital signs were monitored continuously by radiology nursing throughout the procedure under my direct supervision. CONTRAST:  130 cc FLUOROSCOPY: Radiation Exposure Index (as provided by the fluoroscopic device): 3,656 mGy Kerma COMPLICATIONS: None PROCEDURE: Informed consent was obtained from the patient following explanation of the procedure, risks, benefits and  alternatives. The patient understands, agrees and consents for the procedure. All questions were addressed. A time out was performed prior to the initiation of the procedure. Maximal barrier sterile technique utilized including caps, mask, sterile gowns, sterile gloves, large sterile drape, hand hygiene, and Betadine prep. Ultrasound survey of the right inguinal region was performed with images stored and sent to PACs, confirming patency of the vessel. A micropuncture needle was used access the right common femoral artery under ultrasound. With excellent arterial blood flow returned, and an .018 micro wire was passed through the needle, observed enter the abdominal aorta under fluoroscopy. The needle was removed, and a micropuncture sheath was placed over the wire. The inner dilator and wire were removed, and an 035 Bentson wire was advanced under fluoroscopy into the abdominal aorta. The sheath was removed and a standard 5 [place] vascular sheath was placed. The dilator was removed and the sheath was flushed. Omni Flush catheter was advanced into the abdominal aorta. Angiogram was performed. The Omni Flush catheter was used in attempting  to pass the Bentson wire into the external iliac artery. Given the tortuosity, the wire only passed into the internal iliac artery. Attempted to form the prostate catheter into the internal iliac which failed in prolapse into the aorta. Exchange was then made for the Omni Flush catheter on the Bentson wire. Omni Flush catheter was passed over the aortic bifurcation. A Glidewire was passed into the external iliac artery, common femoral artery, superficial femoral artery. The Omni Flush catheter was removed and a 4 French glide cath was then passed into the distal external iliac artery. Glidewire was removed and the Bentson wire was placed into the common femoral artery. The prostate catheter was then formed over the bifurcation. Once the curve was formed the catheter was withdrawn.  The wire was withdrawn, and the tip of the catheter was used to select the left internal iliac artery. With the catheter in position in the internal iliac artery, angiogram was performed. Multiple obliquities were performed including the typical 40-45 degrees ipsilateral oblique image with 10 degrees of caudal-cranial angulation. After this angiogram was assessed, we identified what we perceived to be the prostatic artery from the anterior division. The majority of the fluoroscopic time was then used, attempting 2 achieve a super selective catheter position into this perceived prostatic artery. A combination of a Progreat microcatheter, terumo angled microcatheter, 014 fathom wire, aristotle wire, synchro soft wire, double angle glide wire, synchro 010 were used in attempt to access the target artery. Flat panel CT imaging was performed from the internal iliac artery and reviewed. Ultimately, we were unable to engage the origin of this presumed prostatic artery. We elected to proceed to the right. Microcatheter and microwire were removed. The prostate base catheter was withdrawn to the aorta and used to select the right-sided internal iliac artery. Angiogram was performed. Under roadmap angiogram, the microwire and microcatheter were used to select the right-sided prostatic artery. Angiogram was performed. Flat panel CT imaging was performed to confirm location. 2.5 mg intra arterial verapamil was slowly administered. We then proceeded with embosphere embolization using 100-300 calibrated beads. Stasis was achieved. Final images were performed. Catheters and wires were removed. Celt was deployed for hemostasis. Patient tolerated the procedure well and remained hemodynamically stable throughout. No complications were encountered and no significant blood loss. FINDINGS: Ultrasound of the right inguinal region demonstrates patent common femoral artery with high bifurcation. Minimal atherosclerosis. Pelvic angiogram  demonstrates acute angle of the aortic bifurcation with no significant atherosclerotic changes of the iliac system. Moderate tortuosity of the iliac system. Early takeoff of the bilateral internal iliac artery. On the left, the internal iliac artery is of a traditional type a Yamaki class. What was identified as the left prostatic artery and ultimately was spasm/occlusion arose from the proximal gluteal pudendal trunk, near the origin of superior vesicle arteries, apparently type 2, although this was unproven as the super selective catheter position was unsuccessful. The flat panel CT of the left internal iliac artery is suggestive of a type 2 classification from the anterior division although the origin appeared occluded/spastic. Majority of the fluoroscopy time was used on the left attempting to identify the artery. Angiogram of the right internal iliac artery demonstrates a type Yamaki class, with common gluteal trunk. The prostate artery arises from the mid segment of the internal pudendal artery, type 4 carnavale. Cone beam CT with the catheter in the prostatic artery demonstrates single prostatic artery covering the right hemi gland and portions of the left hemi gland, with no  significant cross contamination from the distal vasculature. Embolization was performed to stasis IMPRESSION: Status post ultrasound guided access right common femoral artery for pelvic angiogram, super selective catheter position into the anterior division of the left internal iliac artery, super selective catheter position into the right prostatic artery, and embolization of the right prostatic artery for treatment of LUTS. The left prostatic artery was either diseased, spastic, or dissected and was not confidently identified. Celt for hemostasis Signed, Yvone Neu. Miachel Roux, RPVI [facility]iology Specialists [facility] Radiology Electronically Signed   By: Gilmer Mor D.O.   On: 07/09/2023 08:10   IR CT  PELVIS W/CM Result Date: 07/09/2023 INDICATION: 45-YEAR-OLD MALE WITH LOWER URINARY TRACT SYMPTOMS SECONDARY TO BPH PRESENTS FOR PROSTATIC ARTERY EMBOLIZATION. EXAM: ULTRASOUND-GUIDED ACCESS RIGHT COMMON FEMORAL ARTERY PELVIC ANGIOGRAM FLAT PANEL CT SCANS FOR CATHETER POSITIONING SUPER SELECTIVE CATHETER PLACEMENT INTO THE LEFT INTERNAL PUDENDAL ARTERY SUPER SELECTIVE CATHETER PLACEMENT INTO THE RIGHT INTERNAL ILIAC ARTERY, ANTERIOR DIVISION, PROSTATIC ARTERY BEAD EMBOLIZATION OF THE RIGHT PROSTATIC ARTERY TO STASIS CELT FOR HEMOSTASIS. MEDICATIONS: 500 mg IV Cipro. The antibiotic was administered within 1 hour of the procedure ANESTHESIA/SEDATION: Moderate (conscious) sedation was employed during this procedure. A total of Versed 4.5 mg and Fentanyl 200 mcg was administered intravenously by the radiology nurse. Total intra-service moderate Sedation Time: 141 minutes. The patient's level of consciousness and vital signs were monitored continuously by radiology nursing throughout the procedure under my direct supervision. CONTRAST:  130 cc FLUOROSCOPY: Radiation Exposure Index (as provided by the fluoroscopic device): 3,656 mGy Kerma COMPLICATIONS: None PROCEDURE: Informed consent was obtained from the patient following explanation of the procedure, risks, benefits and alternatives. The patient understands, agrees and consents for the procedure. All questions were addressed. A time out was performed prior to the initiation of the procedure. Maximal barrier sterile technique utilized including caps, mask, sterile gowns, sterile gloves, large sterile drape, hand hygiene, and Betadine prep. Ultrasound survey of the right inguinal region was performed with images stored and sent to PACs, confirming patency of the vessel. A micropuncture needle was used access the right common femoral artery under ultrasound. With excellent arterial blood flow returned, and an .018 micro wire was passed through the needle, observed enter  the abdominal aorta under fluoroscopy. The needle was removed, and a micropuncture sheath was placed over the wire. The inner dilator and wire were removed, and an 035 Bentson wire was advanced under fluoroscopy into the abdominal aorta. The sheath was removed and a standard 5 [place] vascular sheath was placed. The dilator was removed and the sheath was flushed. Omni Flush catheter was advanced into the abdominal aorta. Angiogram was performed. The Omni Flush catheter was used in attempting to pass the Bentson wire into the external iliac artery. Given the tortuosity, the wire only passed into the internal iliac artery. Attempted to form the prostate catheter into the internal iliac which failed in prolapse into the aorta. Exchange was then made for the Omni Flush catheter on the Bentson wire. Omni Flush catheter was passed over the aortic bifurcation. A Glidewire was passed into the external iliac artery, common femoral artery, superficial femoral artery. The Omni Flush catheter was removed and a 4 French glide cath was then passed into the distal external iliac artery. Glidewire was removed and the Bentson wire was placed into the common femoral artery. The prostate catheter was then formed over the bifurcation. Once the curve was formed the catheter was withdrawn. The wire was withdrawn, and  the tip of the catheter was used to select the left internal iliac artery. With the catheter in position in the internal iliac artery, angiogram was performed. Multiple obliquities were performed including the typical 40-45 degrees ipsilateral oblique image with 10 degrees of caudal-cranial angulation. After this angiogram was assessed, we identified what we perceived to be the prostatic artery from the anterior division. The majority of the fluoroscopic time was then used, attempting 2 achieve a super selective catheter position into this perceived prostatic artery. A combination of a Progreat microcatheter, terumo angled  microcatheter, 014 fathom wire, aristotle wire, synchro soft wire, double angle glide wire, synchro 010 were used in attempt to access the target artery. Flat panel CT imaging was performed from the internal iliac artery and reviewed. Ultimately, we were unable to engage the origin of this presumed prostatic artery. We elected to proceed to the right. Microcatheter and microwire were removed. The prostate base catheter was withdrawn to the aorta and used to select the right-sided internal iliac artery. Angiogram was performed. Under roadmap angiogram, the microwire and microcatheter were used to select the right-sided prostatic artery. Angiogram was performed. Flat panel CT imaging was performed to confirm location. 2.5 mg intra arterial verapamil was slowly administered. We then proceeded with embosphere embolization using 100-300 calibrated beads. Stasis was achieved. Final images were performed. Catheters and wires were removed. Celt was deployed for hemostasis. Patient tolerated the procedure well and remained hemodynamically stable throughout. No complications were encountered and no significant blood loss. FINDINGS: Ultrasound of the right inguinal region demonstrates patent common femoral artery with high bifurcation. Minimal atherosclerosis. Pelvic angiogram demonstrates acute angle of the aortic bifurcation with no significant atherosclerotic changes of the iliac system. Moderate tortuosity of the iliac system. Early takeoff of the bilateral internal iliac artery. On the left, the internal iliac artery is of a traditional type a Yamaki class. What was identified as the left prostatic artery and ultimately was spasm/occlusion arose from the proximal gluteal pudendal trunk, near the origin of superior vesicle arteries, apparently type 2, although this was unproven as the super selective catheter position was unsuccessful. The flat panel CT of the left internal iliac artery is suggestive of a type 2  classification from the anterior division although the origin appeared occluded/spastic. Majority of the fluoroscopy time was used on the left attempting to identify the artery. Angiogram of the right internal iliac artery demonstrates a type Yamaki class, with common gluteal trunk. The prostate artery arises from the mid segment of the internal pudendal artery, type 4 carnavale. Cone beam CT with the catheter in the prostatic artery demonstrates single prostatic artery covering the right hemi gland and portions of the left hemi gland, with no significant cross contamination from the distal vasculature. Embolization was performed to stasis IMPRESSION: Status post ultrasound guided access right common femoral artery for pelvic angiogram, super selective catheter position into the anterior division of the left internal iliac artery, super selective catheter position into the right prostatic artery, and embolization of the right prostatic artery for treatment of LUTS. The left prostatic artery was either diseased, spastic, or dissected and was not confidently identified. Celt for hemostasis Signed, Yvone Neu. Miachel Roux, RPVI [facility] Rad[facility] Electronically Signed   By: Gilmer Mor D.O.   On: 07/09/2023 08:10   IR Angiogram Selective Each Additional Vessel Result Date: 07/09/2023 INDICATION: 65-YEAR-OLD MALE WITH LOWER URINARY TRACT SYMPTOMS SECONDARY TO BPH PRESENTS FOR PROSTATIC ARTERY EMBOLIZATION. EXAM: ULTRASOUND-GUIDED  ACCESS RIGHT COMMON FEMORAL ARTERY PELVIC ANGIOGRAM FLAT PANEL CT SCANS FOR CATHETER POSITIONING SUPER SELECTIVE CATHETER PLACEMENT INTO THE LEFT INTERNAL PUDENDAL ARTERY SUPER SELECTIVE CATHETER PLACEMENT INTO THE RIGHT INTERNAL ILIAC ARTERY, ANTERIOR DIVISION, PROSTATIC ARTERY BEAD EMBOLIZATION OF THE RIGHT PROSTATIC ARTERY TO STASIS CELT FOR HEMOSTASIS. MEDICATIONS: 500 mg IV Cipro. The antibiotic was administered within 1 hour of  the procedure ANESTHESIA/SEDATION: Moderate (conscious) sedation was employed during this procedure. A total of Versed 4.5 mg and Fentanyl 200 mcg was administered intravenously by the radiology nurse. Total intra-service moderate Sedation Time: 141 minutes. The patient's level of consciousness and vital signs were monitored continuously by radiology nursing throughout the procedure under my direct supervision. CONTRAST:  130 cc FLUOROSCOPY: Radiation Exposure Index (as provided by the fluoroscopic device): 3,656 mGy Kerma COMPLICATIONS: None PROCEDURE: Informed consent was obtained from the patient following explanation of the procedure, risks, benefits and alternatives. The patient understands, agrees and consents for the procedure. All questions were addressed. A time out was performed prior to the initiation of the procedure. Maximal barrier sterile technique utilized including caps, mask, sterile gowns, sterile gloves, large sterile drape, hand hygiene, and Betadine prep. Ultrasound survey of the right inguinal region was performed with images stored and sent to PACs, confirming patency of the vessel. A micropuncture needle was used access the right common femoral artery under ultrasound. With excellent arterial blood flow returned, and an .018 micro wire was passed through the needle, observed enter the abdominal aorta under fluoroscopy. The needle was removed, and a micropuncture sheath was placed over the wire. The inner dilator and wire were removed, and an 035 Bentson wire was advanced under fluoroscopy into the abdominal aorta. The sheath was removed and a standard 5 [place] vascular sheath was placed. The dilator was removed and the sheath was flushed. Omni Flush catheter was advanced into the abdominal aorta. Angiogram was performed. The Omni Flush catheter was used in attempting to pass the Bentson wire into the external iliac artery. Given the tortuosity, the wire only passed into the internal iliac  artery. Attempted to form the prostate catheter into the internal iliac which failed in prolapse into the aorta. Exchange was then made for the Omni Flush catheter on the Bentson wire. Omni Flush catheter was passed over the aortic bifurcation. A Glidewire was passed into the external iliac artery, common femoral artery, superficial femoral artery. The Omni Flush catheter was removed and a 4 French glide cath was then passed into the distal external iliac artery. Glidewire was removed and the Bentson wire was placed into the common femoral artery. The prostate catheter was then formed over the bifurcation. Once the curve was formed the catheter was withdrawn. The wire was withdrawn, and the tip of the catheter was used to select the left internal iliac artery. With the catheter in position in the internal iliac artery, angiogram was performed. Multiple obliquities were performed including the typical 40-45 degrees ipsilateral oblique image with 10 degrees of caudal-cranial angulation. After this angiogram was assessed, we identified what we perceived to be the prostatic artery from the anterior division. The majority of the fluoroscopic time was then used, attempting 2 achieve a super selective catheter position into this perceived prostatic artery. A combination of a Progreat microcatheter, terumo angled microcatheter, 014 fathom wire, aristotle wire, synchro soft wire, double angle glide wire, synchro 010 were used in attempt to access the target artery. Flat panel CT imaging was performed from the internal iliac artery and reviewed. Ultimately,  we were unable to engage the origin of this presumed prostatic artery. We elected to proceed to the right. Microcatheter and microwire were removed. The prostate base catheter was withdrawn to the aorta and used to select the right-sided internal iliac artery. Angiogram was performed. Under roadmap angiogram, the microwire and microcatheter were used to select the  right-sided prostatic artery. Angiogram was performed. Flat panel CT imaging was performed to confirm location. 2.5 mg intra arterial verapamil was slowly administered. We then proceeded with embosphere embolization using 100-300 calibrated beads. Stasis was achieved. Final images were performed. Catheters and wires were removed. Celt was deployed for hemostasis. Patient tolerated the procedure well and remained hemodynamically stable throughout. No complications were encountered and no significant blood loss. FINDINGS: Ultrasound of the right inguinal region demonstrates patent common femoral artery with high bifurcation. Minimal atherosclerosis. Pelvic angiogram demonstrates acute angle of the aortic bifurcation with no significant atherosclerotic changes of the iliac system. Moderate tortuosity of the iliac system. Early takeoff of the bilateral internal iliac artery. On the left, the internal iliac artery is of a traditional type a Yamaki class. What was identified as the left prostatic artery and ultimately was spasm/occlusion arose from the proximal gluteal pudendal trunk, near the origin of superior vesicle arteries, apparently type 2, although this was unproven as the super selective catheter position was unsuccessful. The flat panel CT of the left internal iliac artery is suggestive of a type 2 classification from the anterior division although the origin appeared occluded/spastic. Majority of the fluoroscopy time was used on the left attempting to identify the artery. Angiogram of the right internal iliac artery demonstrates a type Yamaki class, with common gluteal trunk. The prostate artery arises from the mid segment of the internal pudendal artery, type 4 carnavale. Cone beam CT with the catheter in the prostatic artery demonstrates single prostatic artery covering the right hemi gland and portions of the left hemi gland, with no significant cross contamination from the distal vasculature. Embolization  was performed to stasis IMPRESSION: Status post ultrasound guided access right common femoral artery for pelvic angiogram, super selective catheter position into the anterior division of the left internal iliac artery, super selective catheter position into the right prostatic artery, and embolization of the right prostatic artery for treatment of LUTS. The left prostatic artery was either diseased, spastic, or dissected and was not confidently identified. Celt for hemostasis Signed, Yvone Neu. Miachel Roux, RPVI [facility]iology [facility] Radiology Electronically Signed   By: Gilmer Mor D.O.   On: 07/09/2023 08:10    Labs:  CBC: Recent Labs    01/21/23 1546 07/08/23 0825  WBC 10.8* 4.3  HGB 15.4 14.2  HCT 45.2 41.1  PLT 225 204    COAGS: Recent Labs    07/08/23 0900  INR 1.0    BMP: Recent Labs    01/21/23 1546 06/14/23 1216 07/08/23 0900  NA 139  --  140  K 4.2  --  4.2  CL 101  --  108  CO2 27  --  22  GLUCOSE 85  --  66*  BUN 23  --  17  CALCIUM 9.8  --  8.8*  CREATININE 1.88* 1.30* 1.01  GFRNONAA 35*  --  >60    LIVER FUNCTION TESTS: Recent Labs    07/08/23 0900  BILITOT 1.2  AST 17  ALT 12  ALKPHOS 56  PROT 6.4*  ALBUMIN 3.7    TUMOR MARKERS: No results for input(s): "  AFPTM", "CEA", "CA199", "CHROMGRNA" in the last 8760 hours.  Assessment and Plan:  [name] is 88 yo male SP pelvic angiogram and treatment of LUTS with prostate artery embolization, right prostate artery 07/08/23.  The left artery was not treated.   He is feeling better already, reporting improved urinary symptoms.  He denies any new symptoms, and seems happy already with his result.  He does continue to have some incontinence on occasion.  This is stable.   We discussed our follow up for him, and we will schedule another appointment in ~3-4 months from now.   Plan: - 3-4 months follow up with Dr. Loreta Ave , no imaging needed, with new IPSS.  -  continue current care - after 1 month, reasonable to start withdraw of silodosin  Electronically Signed: Gilmer Mor 07/26/2023, 9:32 AM   I spent a total of    15 Minutes in remote  clinical consultation, greater than 50% of which was counseling/coordinating care for LUTS, SP PAE.    Visit type: Audio only (telephone). Audio (no video) only due to patient's lack of internet/smartphone capability. Alternative for in-person consultation at [facility], [address] [place], [place], [place]. This visit type was conducted due to national recommendations for restrictions regarding the COVID-19 Pandemic (e.g. social distancing).  This format is felt to be most appropriate for this patient at this time.  All issues noted in this document were discussed and addressed.

## 2023-07-30 DIAGNOSIS — M25512 Pain in left shoulder: Secondary | ICD-10-CM | POA: Diagnosis not present

## 2023-08-02 DIAGNOSIS — M25562 Pain in left knee: Secondary | ICD-10-CM | POA: Diagnosis not present

## 2023-08-02 DIAGNOSIS — M25512 Pain in left shoulder: Secondary | ICD-10-CM | POA: Diagnosis not present

## 2023-08-06 DIAGNOSIS — D044 Carcinoma in situ of skin of scalp and neck: Secondary | ICD-10-CM | POA: Diagnosis not present

## 2023-08-12 DIAGNOSIS — M25512 Pain in left shoulder: Secondary | ICD-10-CM | POA: Diagnosis not present

## 2023-08-12 DIAGNOSIS — M25562 Pain in left knee: Secondary | ICD-10-CM | POA: Diagnosis not present

## 2023-08-14 DIAGNOSIS — M25512 Pain in left shoulder: Secondary | ICD-10-CM | POA: Diagnosis not present

## 2023-08-14 DIAGNOSIS — M25562 Pain in left knee: Secondary | ICD-10-CM | POA: Diagnosis not present

## 2023-08-14 DIAGNOSIS — M25552 Pain in left hip: Secondary | ICD-10-CM | POA: Diagnosis not present

## 2023-08-20 DIAGNOSIS — M25512 Pain in left shoulder: Secondary | ICD-10-CM | POA: Diagnosis not present

## 2023-08-20 DIAGNOSIS — C4441 Basal cell carcinoma of skin of scalp and neck: Secondary | ICD-10-CM | POA: Diagnosis not present

## 2023-08-20 DIAGNOSIS — M25552 Pain in left hip: Secondary | ICD-10-CM | POA: Diagnosis not present

## 2023-08-20 DIAGNOSIS — M25562 Pain in left knee: Secondary | ICD-10-CM | POA: Diagnosis not present

## 2023-08-27 DIAGNOSIS — M25512 Pain in left shoulder: Secondary | ICD-10-CM | POA: Diagnosis not present

## 2023-08-27 DIAGNOSIS — M25552 Pain in left hip: Secondary | ICD-10-CM | POA: Diagnosis not present

## 2023-08-27 DIAGNOSIS — M25562 Pain in left knee: Secondary | ICD-10-CM | POA: Diagnosis not present

## 2023-08-27 IMAGING — DX DG CHEST 2V
2 series · 2 of 2 positions shown · non-contrast
Comparison: 10/26/2020

CLINICAL DATA: Cough

EXAM:
CHEST - 2 VIEW

[chest pa]
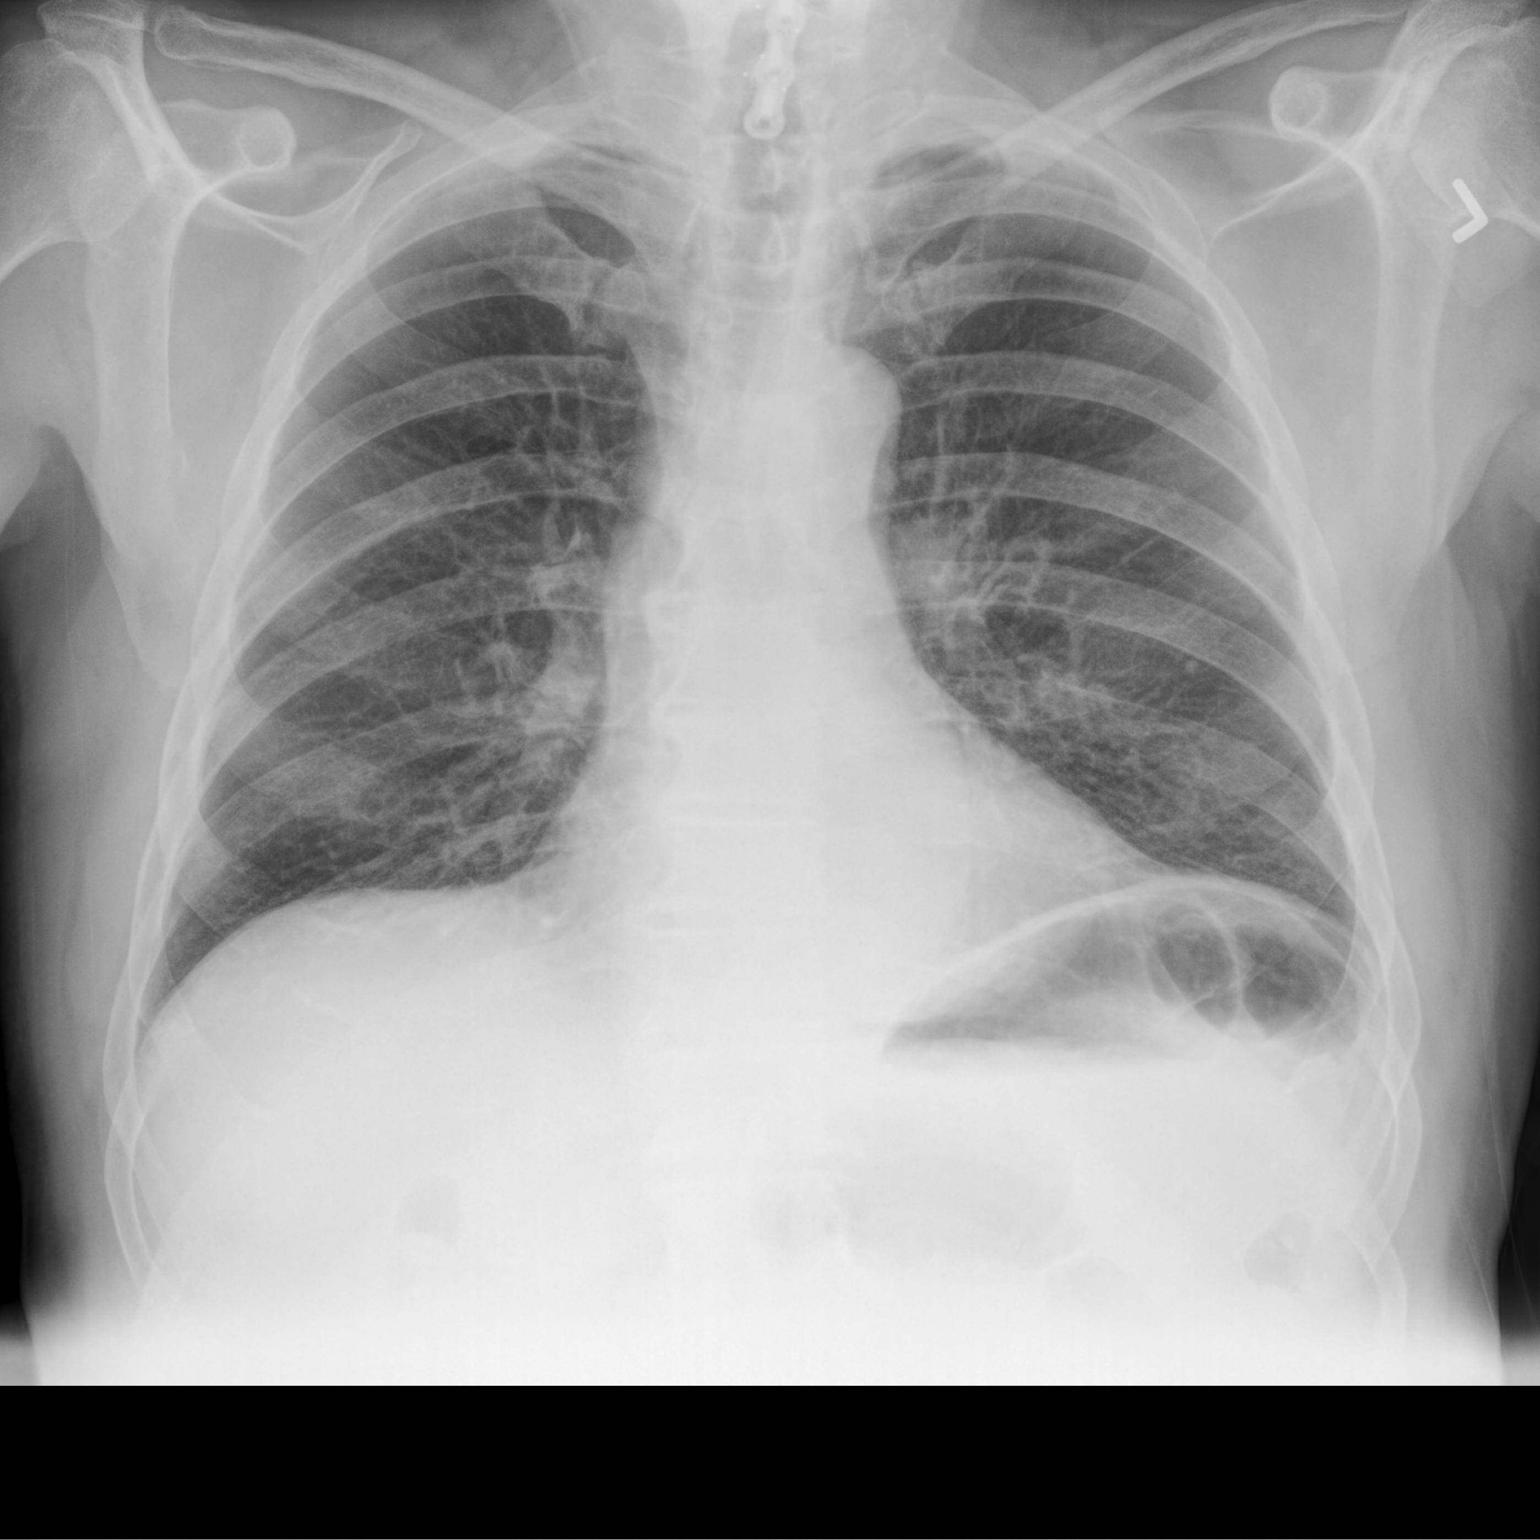

[chest lat]
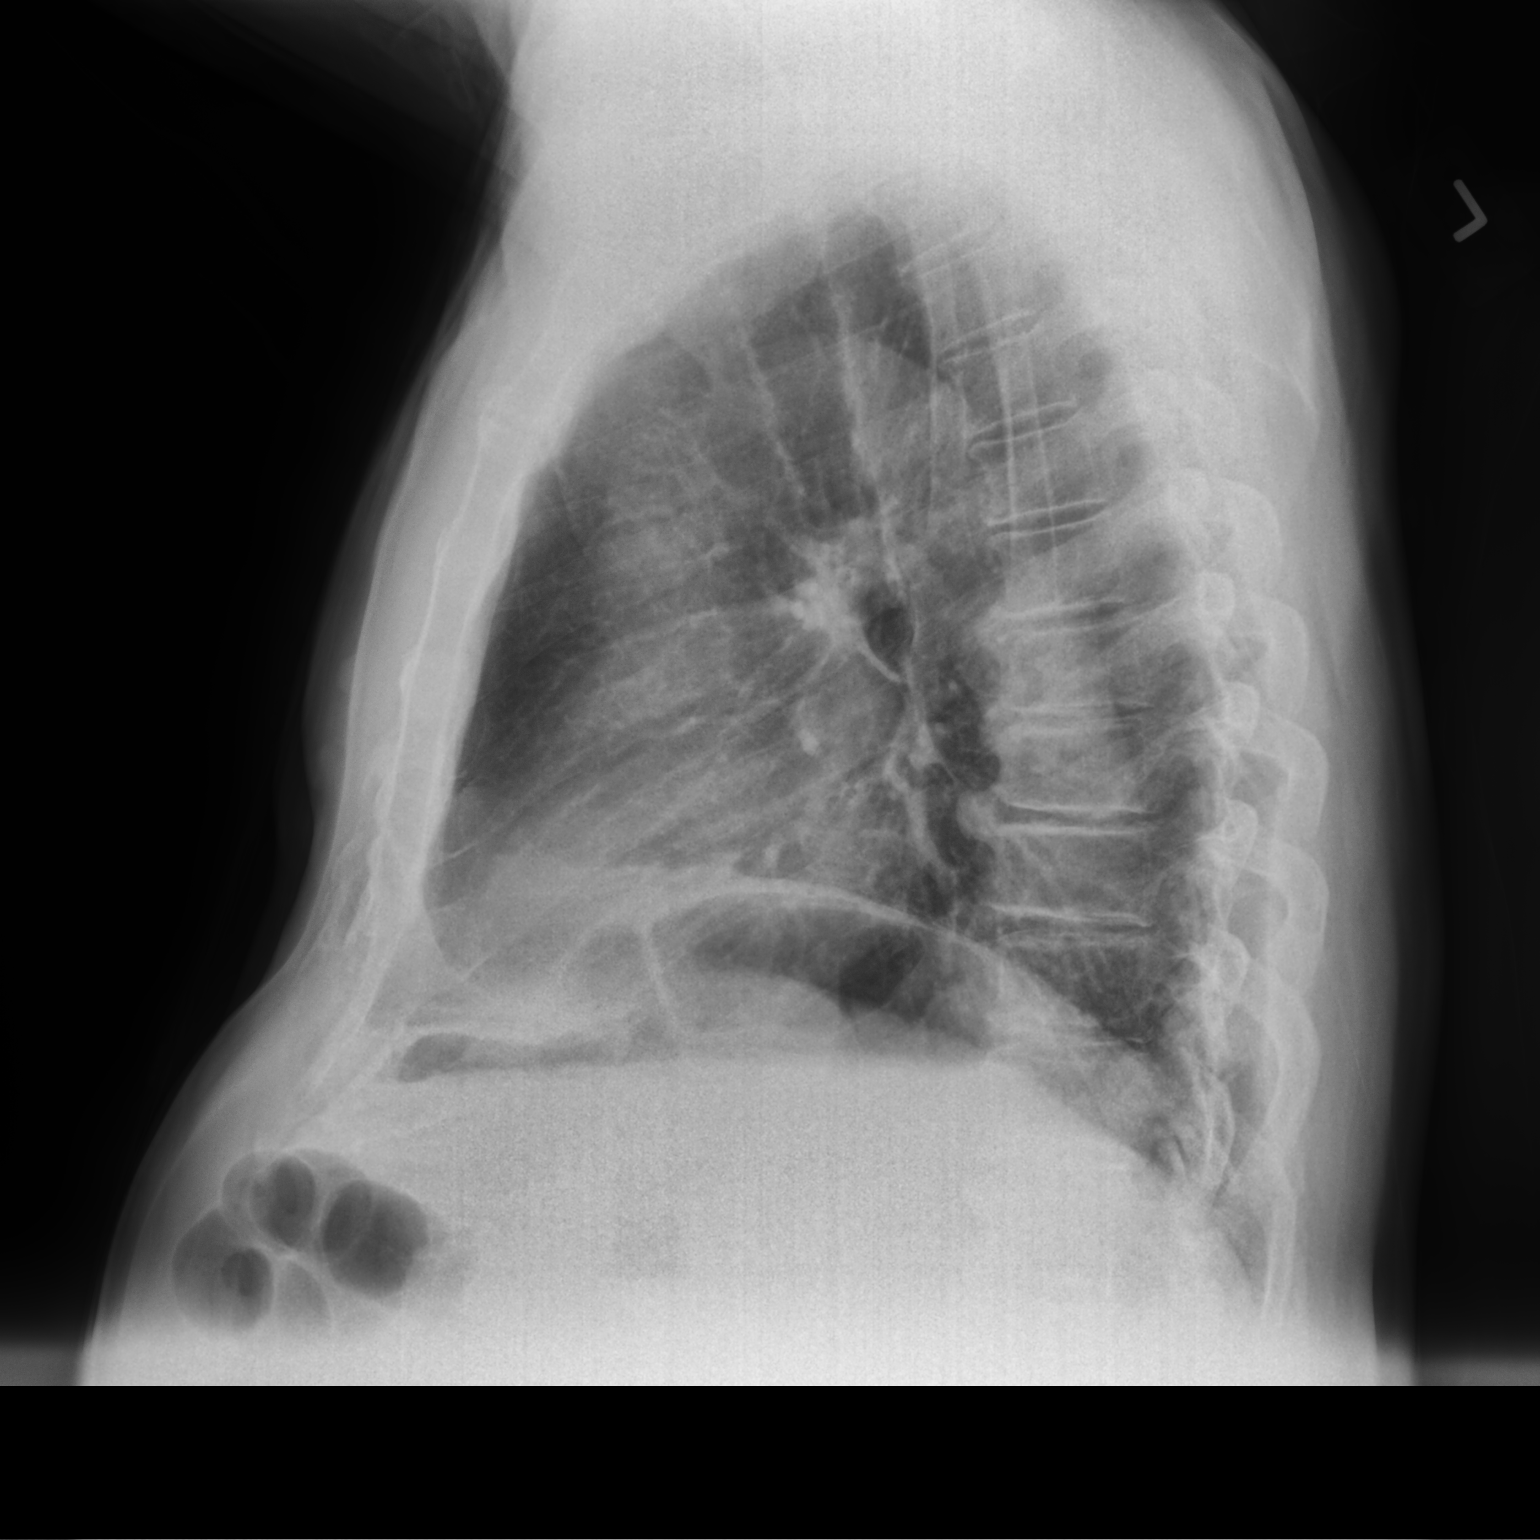

[2 of 2 positions shown; findings below may reference images not displayed]

FINDINGS: The cardiac silhouette, mediastinal and hilar contours are within
normal limits and stable. Mild chronic appearing bronchitic type
lung changes but no infiltrates, edema or effusions. No pulmonary
lesions. The bony thorax is intact.
IMPRESSION: Mild chronic appearing bronchitic type lung changes but no acute
pulmonary findings.

## 2023-09-03 DIAGNOSIS — M25552 Pain in left hip: Secondary | ICD-10-CM | POA: Diagnosis not present

## 2023-09-03 DIAGNOSIS — M25512 Pain in left shoulder: Secondary | ICD-10-CM | POA: Diagnosis not present

## 2023-09-03 DIAGNOSIS — M25562 Pain in left knee: Secondary | ICD-10-CM | POA: Diagnosis not present

## 2023-09-04 DIAGNOSIS — M79674 Pain in right toe(s): Secondary | ICD-10-CM | POA: Diagnosis not present

## 2023-09-10 DIAGNOSIS — M25562 Pain in left knee: Secondary | ICD-10-CM | POA: Diagnosis not present

## 2023-09-10 DIAGNOSIS — M25552 Pain in left hip: Secondary | ICD-10-CM | POA: Diagnosis not present

## 2023-09-10 DIAGNOSIS — S0100XA Unspecified open wound of scalp, initial encounter: Secondary | ICD-10-CM | POA: Diagnosis not present

## 2023-09-10 DIAGNOSIS — M25512 Pain in left shoulder: Secondary | ICD-10-CM | POA: Diagnosis not present

## 2023-09-23 DIAGNOSIS — Z48817 Encounter for surgical aftercare following surgery on the skin and subcutaneous tissue: Secondary | ICD-10-CM | POA: Diagnosis not present

## 2023-09-24 DIAGNOSIS — M25562 Pain in left knee: Secondary | ICD-10-CM | POA: Diagnosis not present

## 2023-09-24 DIAGNOSIS — M25512 Pain in left shoulder: Secondary | ICD-10-CM | POA: Diagnosis not present

## 2023-09-24 DIAGNOSIS — M25552 Pain in left hip: Secondary | ICD-10-CM | POA: Diagnosis not present

## 2023-09-26 DIAGNOSIS — M810 Age-related osteoporosis without current pathological fracture: Secondary | ICD-10-CM | POA: Diagnosis not present

## 2023-09-26 DIAGNOSIS — I10 Essential (primary) hypertension: Secondary | ICD-10-CM | POA: Diagnosis not present

## 2023-09-26 DIAGNOSIS — E0590 Thyrotoxicosis, unspecified without thyrotoxic crisis or storm: Secondary | ICD-10-CM | POA: Diagnosis not present

## 2023-10-01 DIAGNOSIS — M8580 Other specified disorders of bone density and structure, unspecified site: Secondary | ICD-10-CM | POA: Diagnosis not present

## 2023-10-01 DIAGNOSIS — Z0000 Encounter for general adult medical examination without abnormal findings: Secondary | ICD-10-CM | POA: Diagnosis not present

## 2023-10-01 DIAGNOSIS — K219 Gastro-esophageal reflux disease without esophagitis: Secondary | ICD-10-CM | POA: Diagnosis not present

## 2023-10-01 DIAGNOSIS — E0590 Thyrotoxicosis, unspecified without thyrotoxic crisis or storm: Secondary | ICD-10-CM | POA: Diagnosis not present

## 2023-10-01 DIAGNOSIS — I10 Essential (primary) hypertension: Secondary | ICD-10-CM | POA: Diagnosis not present

## 2023-10-01 DIAGNOSIS — Z8349 Family history of other endocrine, nutritional and metabolic diseases: Secondary | ICD-10-CM | POA: Diagnosis not present

## 2023-10-01 DIAGNOSIS — I700 Atherosclerosis of aorta: Secondary | ICD-10-CM | POA: Diagnosis not present

## 2023-10-01 DIAGNOSIS — R351 Nocturia: Secondary | ICD-10-CM | POA: Diagnosis not present

## 2023-10-01 DIAGNOSIS — J45909 Unspecified asthma, uncomplicated: Secondary | ICD-10-CM | POA: Diagnosis not present

## 2023-10-01 DIAGNOSIS — M792 Neuralgia and neuritis, unspecified: Secondary | ICD-10-CM | POA: Diagnosis not present

## 2023-10-02 DIAGNOSIS — S60351A Superficial foreign body of right thumb, initial encounter: Secondary | ICD-10-CM | POA: Diagnosis not present

## 2023-10-03 DIAGNOSIS — Z8349 Family history of other endocrine, nutritional and metabolic diseases: Secondary | ICD-10-CM | POA: Diagnosis not present

## 2023-10-04 DIAGNOSIS — M25512 Pain in left shoulder: Secondary | ICD-10-CM | POA: Diagnosis not present

## 2023-10-04 DIAGNOSIS — M25552 Pain in left hip: Secondary | ICD-10-CM | POA: Diagnosis not present

## 2023-10-04 DIAGNOSIS — M25562 Pain in left knee: Secondary | ICD-10-CM | POA: Diagnosis not present

## 2023-10-10 DIAGNOSIS — M25552 Pain in left hip: Secondary | ICD-10-CM | POA: Diagnosis not present

## 2023-10-10 DIAGNOSIS — M25512 Pain in left shoulder: Secondary | ICD-10-CM | POA: Diagnosis not present

## 2023-10-10 DIAGNOSIS — M25562 Pain in left knee: Secondary | ICD-10-CM | POA: Diagnosis not present

## 2023-10-15 DIAGNOSIS — N3942 Incontinence without sensory awareness: Secondary | ICD-10-CM | POA: Diagnosis not present

## 2023-10-15 DIAGNOSIS — N401 Enlarged prostate with lower urinary tract symptoms: Secondary | ICD-10-CM | POA: Diagnosis not present

## 2023-10-15 DIAGNOSIS — R3915 Urgency of urination: Secondary | ICD-10-CM | POA: Diagnosis not present

## 2023-10-15 DIAGNOSIS — R3914 Feeling of incomplete bladder emptying: Secondary | ICD-10-CM | POA: Diagnosis not present

## 2023-10-21 DIAGNOSIS — M67912 Unspecified disorder of synovium and tendon, left shoulder: Secondary | ICD-10-CM | POA: Diagnosis not present

## 2023-10-22 DIAGNOSIS — Z48817 Encounter for surgical aftercare following surgery on the skin and subcutaneous tissue: Secondary | ICD-10-CM | POA: Diagnosis not present

## 2023-10-22 DIAGNOSIS — L928 Other granulomatous disorders of the skin and subcutaneous tissue: Secondary | ICD-10-CM | POA: Diagnosis not present

## 2023-10-24 DIAGNOSIS — M25512 Pain in left shoulder: Secondary | ICD-10-CM | POA: Diagnosis not present

## 2023-10-24 DIAGNOSIS — M25552 Pain in left hip: Secondary | ICD-10-CM | POA: Diagnosis not present

## 2023-10-24 DIAGNOSIS — M25562 Pain in left knee: Secondary | ICD-10-CM | POA: Diagnosis not present

## 2023-11-05 DIAGNOSIS — M25562 Pain in left knee: Secondary | ICD-10-CM | POA: Diagnosis not present

## 2023-11-05 DIAGNOSIS — M25512 Pain in left shoulder: Secondary | ICD-10-CM | POA: Diagnosis not present

## 2023-11-05 DIAGNOSIS — M25552 Pain in left hip: Secondary | ICD-10-CM | POA: Diagnosis not present

## 2023-11-12 DIAGNOSIS — I700 Atherosclerosis of aorta: Secondary | ICD-10-CM | POA: Diagnosis not present

## 2023-11-13 NOTE — Addendum Note (Signed)
 Encounter addended by: Lesli Rasmussen on: 11/13/2023 3:17 PM  Actions taken: Imaging Exam ended

## 2023-11-19 DIAGNOSIS — M25512 Pain in left shoulder: Secondary | ICD-10-CM | POA: Diagnosis not present

## 2023-11-19 DIAGNOSIS — M25552 Pain in left hip: Secondary | ICD-10-CM | POA: Diagnosis not present

## 2023-11-19 DIAGNOSIS — M25562 Pain in left knee: Secondary | ICD-10-CM | POA: Diagnosis not present

## 2023-12-02 DIAGNOSIS — M25512 Pain in left shoulder: Secondary | ICD-10-CM | POA: Diagnosis not present

## 2023-12-03 DIAGNOSIS — M25512 Pain in left shoulder: Secondary | ICD-10-CM | POA: Diagnosis not present

## 2023-12-03 DIAGNOSIS — M25562 Pain in left knee: Secondary | ICD-10-CM | POA: Diagnosis not present

## 2023-12-03 DIAGNOSIS — M25552 Pain in left hip: Secondary | ICD-10-CM | POA: Diagnosis not present

## 2023-12-17 DIAGNOSIS — M25562 Pain in left knee: Secondary | ICD-10-CM | POA: Diagnosis not present

## 2023-12-17 DIAGNOSIS — M25512 Pain in left shoulder: Secondary | ICD-10-CM | POA: Diagnosis not present

## 2023-12-17 DIAGNOSIS — M25552 Pain in left hip: Secondary | ICD-10-CM | POA: Diagnosis not present

## 2024-01-07 DIAGNOSIS — M25512 Pain in left shoulder: Secondary | ICD-10-CM | POA: Diagnosis not present

## 2024-01-07 DIAGNOSIS — M25552 Pain in left hip: Secondary | ICD-10-CM | POA: Diagnosis not present

## 2024-01-07 DIAGNOSIS — M25562 Pain in left knee: Secondary | ICD-10-CM | POA: Diagnosis not present

## 2024-01-13 DIAGNOSIS — D2261 Melanocytic nevi of right upper limb, including shoulder: Secondary | ICD-10-CM | POA: Diagnosis not present

## 2024-01-13 DIAGNOSIS — D2262 Melanocytic nevi of left upper limb, including shoulder: Secondary | ICD-10-CM | POA: Diagnosis not present

## 2024-01-13 DIAGNOSIS — C4442 Squamous cell carcinoma of skin of scalp and neck: Secondary | ICD-10-CM | POA: Diagnosis not present

## 2024-01-13 DIAGNOSIS — D485 Neoplasm of uncertain behavior of skin: Secondary | ICD-10-CM | POA: Diagnosis not present

## 2024-01-13 DIAGNOSIS — L570 Actinic keratosis: Secondary | ICD-10-CM | POA: Diagnosis not present

## 2024-01-13 DIAGNOSIS — Z85828 Personal history of other malignant neoplasm of skin: Secondary | ICD-10-CM | POA: Diagnosis not present

## 2024-01-13 DIAGNOSIS — M25512 Pain in left shoulder: Secondary | ICD-10-CM | POA: Diagnosis not present

## 2024-01-13 DIAGNOSIS — D2272 Melanocytic nevi of left lower limb, including hip: Secondary | ICD-10-CM | POA: Diagnosis not present

## 2024-01-13 DIAGNOSIS — D225 Melanocytic nevi of trunk: Secondary | ICD-10-CM | POA: Diagnosis not present

## 2024-02-11 DIAGNOSIS — M25552 Pain in left hip: Secondary | ICD-10-CM | POA: Diagnosis not present

## 2024-02-11 DIAGNOSIS — M25512 Pain in left shoulder: Secondary | ICD-10-CM | POA: Diagnosis not present

## 2024-02-11 DIAGNOSIS — M25562 Pain in left knee: Secondary | ICD-10-CM | POA: Diagnosis not present

## 2024-02-14 DIAGNOSIS — M25512 Pain in left shoulder: Secondary | ICD-10-CM | POA: Diagnosis not present

## 2024-02-14 DIAGNOSIS — M25562 Pain in left knee: Secondary | ICD-10-CM | POA: Diagnosis not present

## 2024-02-14 DIAGNOSIS — M25552 Pain in left hip: Secondary | ICD-10-CM | POA: Diagnosis not present

## 2024-02-18 DIAGNOSIS — M25512 Pain in left shoulder: Secondary | ICD-10-CM | POA: Diagnosis not present

## 2024-02-18 DIAGNOSIS — M25552 Pain in left hip: Secondary | ICD-10-CM | POA: Diagnosis not present

## 2024-02-18 DIAGNOSIS — M25562 Pain in left knee: Secondary | ICD-10-CM | POA: Diagnosis not present

## 2024-02-20 ENCOUNTER — Other Ambulatory Visit: Payer: Self-pay | Admitting: *Deleted

## 2024-02-20 ENCOUNTER — Telehealth: Payer: Self-pay | Admitting: Adult Health

## 2024-02-20 DIAGNOSIS — J432 Centrilobular emphysema: Secondary | ICD-10-CM

## 2024-02-20 NOTE — Telephone Encounter (Signed)
 New order placed for PFT.  Nothing further needed.

## 2024-02-20 NOTE — Telephone Encounter (Signed)
 Appointment has been made and PFT but new PFT order needs to be placed since it has expired past the appointment date.

## 2024-02-26 DIAGNOSIS — M25512 Pain in left shoulder: Secondary | ICD-10-CM | POA: Diagnosis not present

## 2024-02-26 DIAGNOSIS — M25562 Pain in left knee: Secondary | ICD-10-CM | POA: Diagnosis not present

## 2024-02-26 DIAGNOSIS — M25552 Pain in left hip: Secondary | ICD-10-CM | POA: Diagnosis not present

## 2024-03-09 DIAGNOSIS — M25512 Pain in left shoulder: Secondary | ICD-10-CM | POA: Diagnosis not present

## 2024-03-11 DIAGNOSIS — M25512 Pain in left shoulder: Secondary | ICD-10-CM | POA: Diagnosis not present

## 2024-03-11 DIAGNOSIS — M25562 Pain in left knee: Secondary | ICD-10-CM | POA: Diagnosis not present

## 2024-03-11 DIAGNOSIS — M25552 Pain in left hip: Secondary | ICD-10-CM | POA: Diagnosis not present

## 2024-03-12 DIAGNOSIS — Z23 Encounter for immunization: Secondary | ICD-10-CM | POA: Diagnosis not present

## 2024-03-17 DIAGNOSIS — M47816 Spondylosis without myelopathy or radiculopathy, lumbar region: Secondary | ICD-10-CM | POA: Diagnosis not present

## 2024-03-26 DIAGNOSIS — D044 Carcinoma in situ of skin of scalp and neck: Secondary | ICD-10-CM | POA: Diagnosis not present

## 2024-03-26 DIAGNOSIS — L570 Actinic keratosis: Secondary | ICD-10-CM | POA: Diagnosis not present

## 2024-03-26 DIAGNOSIS — M25552 Pain in left hip: Secondary | ICD-10-CM | POA: Diagnosis not present

## 2024-03-26 DIAGNOSIS — M25562 Pain in left knee: Secondary | ICD-10-CM | POA: Diagnosis not present

## 2024-03-26 DIAGNOSIS — M25512 Pain in left shoulder: Secondary | ICD-10-CM | POA: Diagnosis not present

## 2024-03-26 DIAGNOSIS — C4442 Squamous cell carcinoma of skin of scalp and neck: Secondary | ICD-10-CM | POA: Diagnosis not present

## 2024-03-27 DIAGNOSIS — M76891 Other specified enthesopathies of right lower limb, excluding foot: Secondary | ICD-10-CM | POA: Diagnosis not present

## 2024-03-27 DIAGNOSIS — M25561 Pain in right knee: Secondary | ICD-10-CM | POA: Diagnosis not present

## 2024-03-31 DIAGNOSIS — M47816 Spondylosis without myelopathy or radiculopathy, lumbar region: Secondary | ICD-10-CM | POA: Diagnosis not present

## 2024-04-02 DIAGNOSIS — E0590 Thyrotoxicosis, unspecified without thyrotoxic crisis or storm: Secondary | ICD-10-CM | POA: Diagnosis not present

## 2024-04-09 ENCOUNTER — Ambulatory Visit: Payer: PRIVATE HEALTH INSURANCE | Admitting: Adult Health

## 2024-04-09 DIAGNOSIS — M810 Age-related osteoporosis without current pathological fracture: Secondary | ICD-10-CM | POA: Diagnosis not present

## 2024-04-09 DIAGNOSIS — E0590 Thyrotoxicosis, unspecified without thyrotoxic crisis or storm: Secondary | ICD-10-CM | POA: Diagnosis not present

## 2024-04-09 DIAGNOSIS — I10 Essential (primary) hypertension: Secondary | ICD-10-CM | POA: Diagnosis not present

## 2024-04-09 DIAGNOSIS — E559 Vitamin D deficiency, unspecified: Secondary | ICD-10-CM | POA: Diagnosis not present

## 2024-04-14 NOTE — Progress Notes (Unsigned)
 Cardiology Office Note:   Date:  04/15/2024  ID:  [name], DOB 14-Apr-1936, MRN [id number] PCP:  Clarice Nottingham, MD  [facility] Providers Cardiologist:  Wendel Haws, MD Referring MD: Clarice Nottingham, MD  Chief Complaint/Reason for Referral: Follow-up coronary artery disease ASSESSMENT:    1. Coronary artery calcification seen on CAT scan   2. Hyperlipidemia LDL goal <70   3. Aortic atherosclerosis   4. Primary hypertension   5. BMI 28.0-28.9,adult     PLAN:   In order of problems listed above: Coronary artery calcification: Continue aspirin 81 mg, Zetia 10 mg.  Will defer hs-CRP testing. Hyperlipidemia: By virtue presence of coronary artery calcification the patient has hyperlipidemia.  Will defer statin his advanced age individual.  Defer LP(a) testing. Aortic atherosclerosis: Continue aspirin 81 mg; continue Zetia 10 mg. Hypertension: Continue losartan 25 mg.  Blood pressure is well-controlled today. CKD stage II: Continue losartan 25 mg Elevated BMI: Continue diet and exercise modification.            Dispo:  No follow-ups on file.       I spent 35 minutes reviewing all clinical data during and prior to this visit including all relevant imaging studies, laboratories, clinical information from other health systems and prior notes from both Cardiology and other specialties, interviewing the patient, conducting a complete physical examination, and coordinating care in order to formulate a comprehensive and personalized evaluation and treatment plan.   History of Present Illness:    FOCUSED PROBLEM LIST:    Coronary artery calcification Calcium score 4 calcium score CT 2022 Hyperlipidemia Aortic atherosclerosis Calcium score CT 2022 Hypertension Hyperthyroidism CKD stage II BMI 08 May 2024:  Patient consents to use of AI scribe. The patient returns for routine follow-up.  He was last seen last September.  He was doing well without any  cardiovascular complaints.  The patient continues to do well.  He denies any chest pain, shortness of breath, palpitations, paroxysmal atrial dyspnea, orthopnea.  His biggest issues seem to be stemming from orthopedic problems with his back and his legs from a long history of distance running.  He is tolerating his medications well.     Current Medications: Current Meds  Medication Sig   albuterol  (VENTOLIN  HFA) 108 (90 Base) MCG/ACT inhaler Inhale 1-2 puffs into the lungs every 6 (six) hours as needed.   ascorbic acid (VITAMIN C) 1000 MG tablet Take 1,000 mg by mouth daily.   aspirin 81 MG EC tablet Take 81 mg by mouth daily.   budesonide -formoterol  (SYMBICORT ) 160-4.5 MCG/ACT inhaler Inhale 2 puffs into the lungs 2 (two) times daily. (Patient taking differently: Inhale 2 puffs into the lungs as needed.)   Cholecalciferol 125 MCG (5000 UT) capsule Take 5,000 Units by mouth daily.   ezetimibe (ZETIA) 10 MG tablet Take 10 mg by mouth daily.   losartan (COZAAR) 25 MG tablet Take 25 mg by mouth daily.   methimazole (TAPAZOLE) 5 MG tablet Take 5 mg by mouth daily.   niacin 500 MG tablet Take 500 mg by mouth at bedtime.   omeprazole (PRILOSEC) 40 MG capsule Take 40 mg by mouth daily.   [DISCONTINUED] silodosin (RAPAFLO) 8 MG CAPS capsule Take 8 mg by mouth daily with breakfast.     Review of Systems:   Please see the history of present illness.    All other systems reviewed and are negative.     EKGs/Labs/Other Test Reviewed:   EKG: 2024 sinus rhythm with PVCs  EKG Interpretation Date/Time:  Wednesday April 15 2024 10:59:41 EST Ventricular Rate:  73 PR Interval:  198 QRS Duration:  94 QT Interval:  392 QTC Calculation: 431 R Axis:   37  Text Interpretation: Normal sinus rhythm Normal ECG When compared with ECG of 21-Jan-2023 15:06, Premature ventricular complexes are no longer Present Premature atrial complexes are no longer Present Confirmed by Wendel Haws ([id number]) on 04/15/2024  11:11:19 AM        CARDIAC STUDIES: Refer to CV Procedures and Imaging Tabs   Risk Assessment/Calculations:          Physical Exam:   VS:  BP 130/70 (BP Location: Right Arm, Patient Position: Sitting)   Pulse 73   Ht 5' 8 (1.727 m)   Wt 189 lb (85.7 kg)   SpO2 98%   BMI 28.74 kg/m        Wt Readings from Last 3 Encounters:  04/15/24 189 lb (85.7 kg)  07/08/23 188 lb (85.3 kg)  03/05/23 188 lb 9.6 oz (85.5 kg)      GENERAL:  No apparent distress, AOx3 HEENT:  No carotid bruits, +2 carotid impulses, no scleral icterus CAR: RRR no murmurs, gallops, rubs, or thrills RES:  Clear to auscultation bilaterally ABD:  Soft, nontender, nondistended, positive bowel sounds x 4 VASC:  +2 radial pulses, +2 carotid pulses NEURO:  CN 2-12 grossly intact; motor and sensory grossly intact PSYCH:  No active depression or anxiety EXT:  No edema, ecchymosis, or cyanosis  Signed, Haws MARLA Wendel, MD  04/15/2024 11:25 AM    [facility] [address] [place], [place], [place]  [address] Phone: [phone]; Fax: [phone]   Note:  This document was prepared using Dragon voice recognition software and may include unintentional dictation errors.

## 2024-04-15 ENCOUNTER — Encounter: Payer: Self-pay | Admitting: Internal Medicine

## 2024-04-15 ENCOUNTER — Ambulatory Visit: Attending: Internal Medicine | Admitting: Internal Medicine

## 2024-04-15 VITALS — BP 130/70 | HR 73 | Ht 68.0 in | Wt 189.0 lb

## 2024-04-15 DIAGNOSIS — I10 Essential (primary) hypertension: Secondary | ICD-10-CM | POA: Insufficient documentation

## 2024-04-15 DIAGNOSIS — E785 Hyperlipidemia, unspecified: Secondary | ICD-10-CM | POA: Insufficient documentation

## 2024-04-15 DIAGNOSIS — I2510 Atherosclerotic heart disease of native coronary artery without angina pectoris: Secondary | ICD-10-CM | POA: Diagnosis not present

## 2024-04-15 DIAGNOSIS — I700 Atherosclerosis of aorta: Secondary | ICD-10-CM | POA: Diagnosis not present

## 2024-04-15 DIAGNOSIS — Z6828 Body mass index (BMI) 28.0-28.9, adult: Secondary | ICD-10-CM | POA: Insufficient documentation

## 2024-04-15 NOTE — Patient Instructions (Signed)
 Medication Instructions:  No medication changes were made at this visit. Continue current regimen.   *If you need a refill on your cardiac medications before your next appointment, please call your pharmacy*  Lab Work: None ordered today. If you have labs (blood work) drawn today and your tests are completely normal, you will receive your results only by: MyChart Message (if you have MyChart) OR A paper copy in the mail If you have any lab test that is abnormal or we need to change your treatment, we will call you to review the results.  Testing/Procedures: None ordered today.  Follow-Up: At [facility], you and your health needs are our priority.  As part of our continuing mission to provide you with exceptional heart care, our providers are all part of one team.  This team includes your primary Cardiologist (physician) and Advanced Practice Providers or APPs (Physician Assistants and Nurse Practitioners) who all work together to provide you with the care you need, when you need it.  Your next appointment:   1 year(s)  Provider:   Lurena Red, MD

## 2024-04-17 DIAGNOSIS — M25512 Pain in left shoulder: Secondary | ICD-10-CM | POA: Diagnosis not present

## 2024-04-17 DIAGNOSIS — M25562 Pain in left knee: Secondary | ICD-10-CM | POA: Diagnosis not present

## 2024-04-17 DIAGNOSIS — M25552 Pain in left hip: Secondary | ICD-10-CM | POA: Diagnosis not present

## 2024-04-24 DIAGNOSIS — M5416 Radiculopathy, lumbar region: Secondary | ICD-10-CM | POA: Diagnosis not present

## 2024-04-24 DIAGNOSIS — M533 Sacrococcygeal disorders, not elsewhere classified: Secondary | ICD-10-CM | POA: Diagnosis not present

## 2024-04-27 ENCOUNTER — Other Ambulatory Visit: Payer: Self-pay | Admitting: Orthopedic Surgery

## 2024-04-27 DIAGNOSIS — M533 Sacrococcygeal disorders, not elsewhere classified: Secondary | ICD-10-CM

## 2024-05-01 DIAGNOSIS — M47816 Spondylosis without myelopathy or radiculopathy, lumbar region: Secondary | ICD-10-CM | POA: Diagnosis not present

## 2024-05-14 ENCOUNTER — Ambulatory Visit
Admission: RE | Admit: 2024-05-14 | Discharge: 2024-05-14 | Disposition: A | Payer: PRIVATE HEALTH INSURANCE | Source: Ambulatory Visit | Attending: Orthopedic Surgery | Admitting: Orthopedic Surgery

## 2024-05-14 ENCOUNTER — Inpatient Hospital Stay
Admission: RE | Admit: 2024-05-14 | Discharge: 2024-05-14 | Disposition: A | Payer: PRIVATE HEALTH INSURANCE | Source: Ambulatory Visit | Attending: Orthopedic Surgery | Admitting: Orthopedic Surgery

## 2024-05-14 DIAGNOSIS — M533 Sacrococcygeal disorders, not elsewhere classified: Secondary | ICD-10-CM

## 2024-05-15 DIAGNOSIS — M25562 Pain in left knee: Secondary | ICD-10-CM | POA: Diagnosis not present

## 2024-05-15 DIAGNOSIS — M25552 Pain in left hip: Secondary | ICD-10-CM | POA: Diagnosis not present

## 2024-05-21 DIAGNOSIS — M25562 Pain in left knee: Secondary | ICD-10-CM | POA: Diagnosis not present

## 2024-05-21 DIAGNOSIS — M25552 Pain in left hip: Secondary | ICD-10-CM | POA: Diagnosis not present

## 2024-05-26 DIAGNOSIS — M5416 Radiculopathy, lumbar region: Secondary | ICD-10-CM | POA: Diagnosis not present

## 2024-05-26 DIAGNOSIS — M25551 Pain in right hip: Secondary | ICD-10-CM | POA: Diagnosis not present

## 2024-05-26 DIAGNOSIS — M25552 Pain in left hip: Secondary | ICD-10-CM | POA: Diagnosis not present

## 2024-05-27 ENCOUNTER — Ambulatory Visit: Admitting: *Deleted

## 2024-05-27 DIAGNOSIS — M533 Sacrococcygeal disorders, not elsewhere classified: Secondary | ICD-10-CM | POA: Diagnosis not present

## 2024-05-27 DIAGNOSIS — J432 Centrilobular emphysema: Secondary | ICD-10-CM | POA: Diagnosis not present

## 2024-05-27 LAB — PULMONARY FUNCTION TEST
DL/VA % pred: 98 %
DL/VA: 3.78 ml/min/mmHg/L
DLCO cor % pred: 97 %
DLCO cor: 21.29 ml/min/mmHg
DLCO unc % pred: 97 %
DLCO unc: 21.29 ml/min/mmHg
FEF 25-75 Post: 2.57 L/s
FEF 25-75 Pre: 1.59 L/s
FEF2575-%Change-Post: 61 %
FEF2575-%Pred-Post: 178 %
FEF2575-%Pred-Pre: 110 %
FEV1-%Change-Post: 11 %
FEV1-%Pred-Post: 116 %
FEV1-%Pred-Pre: 104 %
FEV1-Post: 2.68 L
FEV1-Pre: 2.4 L
FEV1FVC-%Change-Post: 2 %
FEV1FVC-%Pred-Pre: 102 %
FEV6-%Change-Post: 9 %
FEV6-%Pred-Post: 117 %
FEV6-%Pred-Pre: 106 %
FEV6-Post: 3.62 L
FEV6-Pre: 3.3 L
FEV6FVC-%Change-Post: 0 %
FEV6FVC-%Pred-Post: 108 %
FEV6FVC-%Pred-Pre: 107 %
FVC-%Change-Post: 9 %
FVC-%Pred-Post: 108 %
FVC-%Pred-Pre: 99 %
FVC-Post: 3.64 L
FVC-Pre: 3.33 L
Post FEV1/FVC ratio: 74 %
Post FEV6/FVC ratio: 100 %
Pre FEV1/FVC ratio: 72 %
Pre FEV6/FVC Ratio: 99 %
RV % pred: 139 %
RV: 3.68 L
TLC % pred: 109 %
TLC: 7.21 L

## 2024-05-27 NOTE — Patient Instructions (Signed)
 Full PFT performed today.

## 2024-05-27 NOTE — Progress Notes (Signed)
 Full PFT performed today.

## 2024-05-29 ENCOUNTER — Encounter: Payer: Self-pay | Admitting: Adult Health

## 2024-05-29 ENCOUNTER — Ambulatory Visit: Payer: PRIVATE HEALTH INSURANCE | Admitting: Adult Health

## 2024-05-29 VITALS — BP 128/72 | HR 60 | Temp 98.7°F | Ht 67.5 in | Wt 193.8 lb

## 2024-05-29 DIAGNOSIS — J432 Centrilobular emphysema: Secondary | ICD-10-CM

## 2024-05-29 DIAGNOSIS — J309 Allergic rhinitis, unspecified: Secondary | ICD-10-CM

## 2024-05-29 DIAGNOSIS — J439 Emphysema, unspecified: Secondary | ICD-10-CM

## 2024-05-29 DIAGNOSIS — R911 Solitary pulmonary nodule: Secondary | ICD-10-CM | POA: Diagnosis not present

## 2024-05-29 DIAGNOSIS — J4520 Mild intermittent asthma, uncomplicated: Secondary | ICD-10-CM | POA: Diagnosis not present

## 2024-05-29 NOTE — Progress Notes (Signed)
 "  @Patient  ID: [name], male    DOB: December 05, 1935, 88 y.o.   MRN: [id number]  Chief Complaint  Patient presents with   Medical Management of Chronic Issues    PFT f/u    Referring provider: Clarice Nottingham, MD  HPI: 88 yo male former smoker followed for Mild Emphysema and Intermittent Asthma     TEST/EVENTS : Reviewed 05/29/2024  high-resolution CT chest scan was obtained due to the chronic cough which revealed mild diffuse centrilobular emphysematous changes and millimetric pulmonary nodules which are unchanged since 08/11/2020.    Discussed the use of AI scribe software for clinical note transcription with the patient, who gave verbal consent to proceed.  History of Present Illness [name] is an 88 year old male with Mild Emphysema and asthma who presents for a pulmonary follow-up.  He has not used Symbicort  for a long time and has not experienced any asthma symptoms recently. He received his flu shot on October 1st and is unsure about the timing of his last pneumonia booster. No recent use of albuterol  and no hemoptysis. He reports eating well. No cough.   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 This condition has kept him off the golf course for three to four months. He has been exploring dry needling as a treatment option, which he believes may help alleviate nerve aggravation.  He has a history of degenerative disc disease with a fusion of the L2-3-5 levels. 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 He is currently less active due to back issues but plans to resume golf once his condition improves.  He has a history of smoking, which he quit 50 years ago.  Previous high-resolution CT chest December 2022 showed biapical scarring, mild emphysema, stable micro pulmonary nodules unchanged from 2022 likely benign  etiology.  He is a former long-distance runner.  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  His daughter, who had a severe asthma attack in the past, is now a respiratory therapist.   Pulmonary function testing done on May 27, 2024 was reviewed in detail.  Showed normal lung function with FEV1 at 116%, ratio 74, FVC 108%, positive bronchodilator response, DLCO 97%.   Allergies[1]  Immunization History  Administered Date(s) Administered   DTaP 02/14/2011   INFLUENZA, HIGH DOSE SEASONAL PF 03/30/2014, 04/01/2015, 03/20/2016   Influenza Whole 03/25/2009   Influenza, Quadrivalent, Recombinant, Inj, Pf 03/25/2017, 03/04/2018, 04/02/2019, 03/11/2020, 03/13/2021   Influenza-Unspecified 03/11/2018   PFIZER(Purple Top)SARS-COV-2 Vaccination 03/07/2020   Pneumococcal Conjugate-13 06/23/2007, 03/15/2014   Pneumococcal Polysaccharide-23 03/11/2008   Tdap 02/24/2011   Zoster Recombinant(Shingrix) 07/07/2018, 10/06/2018   Zoster, Live 03/09/2006    Past Medical History:  Diagnosis Date   Allergic rhinitis    Asthma, mild last used prn inhaler 2 yrs ago   runs 10 miles weekly---   FOLLOWED BY DR REGGY YOUNG   Frequency of urination    H/O bronchitis SEASONAL    History of gastroesophageal reflux (GERD) WATCHES DIET , NO PROBLEMS FOR A WHILE   History of kidney stones    Hx of tension headache    Hypertension    Mild sleep apnea USES BREATH-RITE STRIPS   NO CPAP   White coat hypertension     Tobacco History: Tobacco Use History[2] Counseling given: Not Answered   Outpatient Medications  Prior to Visit  Medication Sig Dispense Refill   albuterol  (VENTOLIN  HFA) 108 (90 Base) MCG/ACT inhaler Inhale 1-2 puffs into the lungs every 6 (six) hours as needed. 8 g 2   ascorbic acid (VITAMIN C) 1000 MG tablet Take 1,000 mg by mouth daily.     aspirin 81 MG EC tablet Take 81 mg by mouth daily.     budesonide -formoterol  (SYMBICORT ) 160-4.5 MCG/ACT  inhaler Inhale 2 puffs into the lungs 2 (two) times daily. (Patient taking differently: Inhale 2 puffs into the lungs as needed.) 1 each 12   Cholecalciferol 125 MCG (5000 UT) capsule Take 5,000 Units by mouth daily.     ezetimibe (ZETIA) 10 MG tablet Take 10 mg by mouth daily.     losartan (COZAAR) 25 MG tablet Take 25 mg by mouth daily.     methimazole (TAPAZOLE) 5 MG tablet Take 5 mg by mouth daily.     niacin 500 MG tablet Take 500 mg by mouth at bedtime.     omeprazole (PRILOSEC) 40 MG capsule Take 40 mg by mouth daily.     vitamin k 100 MCG tablet Take 100 mcg by mouth daily.     No facility-administered medications prior to visit.     Review of Systems:   Constitutional:   No  weight loss, night sweats,  Fevers, chills, fatigue, or  lassitude.  HEENT:   No headaches,  Difficulty swallowing,  Tooth/dental problems, or  Sore throat,                No sneezing, itching, ear ache, nasal congestion, post nasal drip,   CV:  No chest pain,  Orthopnea, PND, swelling in lower extremities, anasarca, dizziness, palpitations, syncope.   GI  No heartburn, indigestion, abdominal pain, nausea, vomiting, diarrhea, change in bowel habits, loss of appetite, bloody stools.   Resp: No shortness of breath with exertion or at rest.  No excess mucus, no productive cough,  No non-productive cough,  No coughing up of blood.  No change in color of mucus.  No wheezing.  No chest wall deformity  Skin: no rash or lesions.  GU: no dysuria, change in color of urine, no urgency or frequency.  No flank pain, no hematuria   MS:+back pain.    Physical Exam  BP 128/72   Pulse 60   Temp 98.7 F (37.1 C)   Ht 5' 7.5 (1.715 m) Comment: Per pt  Wt 193 lb 12.8 oz (87.9 kg)   SpO2 96% Comment: RA  BMI 29.91 kg/m   GEN: A/Ox3; pleasant , NAD, well nourished    HEENT:  [place]/AT,  NOSE-clear, THROAT-clear, no lesions, no postnasal drip or exudate noted.   NECK:  Supple w/ fair ROM; no JVD; normal carotid  impulses w/o bruits; no thyromegaly or nodules palpated; no lymphadenopathy.    RESP  Clear  P & A; w/o, wheezes/ rales/ or rhonchi. no accessory muscle use, no dullness to percussion  CARD:  RRR, no m/r/g, no peripheral edema, pulses intact, no cyanosis or clubbing.  GI:   Soft & nt; nml bowel sounds; no organomegaly or masses detected.   Musco: Warm bil, no deformities or joint swelling noted.   Neuro: alert, no focal deficits noted.    Skin: Warm, no lesions or rashes    Lab Results:Reviewed 05/29/2024   CBC    Component Value Date/Time   WBC 4.3 07/08/2023 0825   RBC 4.48 07/08/2023 0825   HGB 14.2 07/08/2023 0825  HCT 41.1 07/08/2023 0825   PLT 204 07/08/2023 0825   MCV 91.7 07/08/2023 0825   MCH 31.7 07/08/2023 0825   MCHC 34.5 07/08/2023 0825   RDW 13.4 07/08/2023 0825   LYMPHSABS 2.2 07/01/2010 1433   MONOABS 0.5 07/01/2010 1433   EOSABS 0.2 07/01/2010 1433   BASOSABS 0.0 07/01/2010 1433    BMET   BNP No results found for: BNP  ProBNP No results found for: PROBNP  Imaging:   Administration History     None          Latest Ref Rng & Units 05/27/2024   12:52 PM  PFT Results  FVC-Pre L 3.33  P  FVC-Predicted Pre % 99  P  FVC-Post L 3.64  P  FVC-Predicted Post % 108  P  Pre FEV1/FVC % % 72  P  Post FEV1/FCV % % 74  P  FEV1-Pre L 2.40  P  FEV1-Predicted Pre % 104  P  FEV1-Post L 2.68  P  DLCO uncorrected ml/min/mmHg 21.29  P  DLCO UNC% % 97  P  DLCO corrected ml/min/mmHg 21.29  P  DLCO COR %Predicted % 97  P  DLVA Predicted % 98  P  TLC L 7.21  P  TLC % Predicted % 109  P  RV % Predicted % 139  P    P Preliminary result    No results found for: NITRICOXIDE      No data to display              Assessment & Plan:   Assessment and Plan Assessment & Plan Asthma -Mild intermittent -controlled  Pulmonary function testing is normal with no airflow obstruction or restriction. His asthma is well-controlled with no  recent exacerbations. He has not used Symbicort  recently and reports no symptoms. Continue current asthma management plan. Ensure albuterol  is available for use as needed.  Mild emphysema - clinically stable  Mild emphysema on imaging.  Pulmonary function testing shows no significant airflow obstruction or restriction and normal DLCO.  He has mild emphysema Pulmonary function tests show excellent lung function.   Pulmonary nodularity-stable CT imaging 2022 showed stable micronodularity.  Chest x-ray September 2024 showed stable changes.  Previous imaging showed benign nodules and mild scarring, likely related to past smoking history. Encourage continued healthy lifestyle and activity as tolerated.  Consider repeat chest x-ray on follow-up visit.  Allergic rhinitis-stable Claritin as needed       Harrol Novello, NP 05/29/2024  I spent  39 minutes dedicated to the care of this patient on the date of this encounter to include pre-visit review of records, face-to-face time with the patient discussing conditions above, post visit ordering of testing, clinical documentation with the electronic health record, making appropriate referrals as documented, and communicating necessary findings to members of the patients care team.      [1]  Allergies Allergen Reactions   Codeine Shortness Of Breath   Penicillins Rash  [2]  Social History Tobacco Use  Smoking Status Former   Current packs/day: 0.00   Types: Cigarettes   Start date: 06/11/1956   Quit date: 06/11/1974   Years since quitting: 50.0   Passive exposure: Never  Smokeless Tobacco Never   "

## 2024-05-29 NOTE — Patient Instructions (Addendum)
 Use during Asthma Flares -Symbicort  2 puffs Twice daily , rinse after use. Albuterol  inhaler As needed  Claritin 10mg  At bedtime As needed  allergies.  Activity as tolerated.  Follow up with Dr. Kara in 1 year and As needed

## 2024-06-23 ENCOUNTER — Other Ambulatory Visit: Payer: Self-pay | Admitting: Orthopedic Surgery

## 2024-06-23 DIAGNOSIS — M533 Sacrococcygeal disorders, not elsewhere classified: Secondary | ICD-10-CM

## 2024-07-09 ENCOUNTER — Other Ambulatory Visit: Payer: Self-pay | Admitting: Orthopedic Surgery

## 2024-07-09 DIAGNOSIS — M533 Sacrococcygeal disorders, not elsewhere classified: Secondary | ICD-10-CM

## 2024-07-15 ENCOUNTER — Inpatient Hospital Stay
Admission: RE | Admit: 2024-07-15 | Discharge: 2024-07-15 | Disposition: A | Source: Ambulatory Visit | Attending: Orthopedic Surgery | Admitting: Orthopedic Surgery

## 2024-07-15 ENCOUNTER — Inpatient Hospital Stay
Admission: RE | Admit: 2024-07-15 | Discharge: 2024-07-15 | Attending: Orthopedic Surgery | Admitting: Orthopedic Surgery

## 2024-07-15 DIAGNOSIS — M533 Sacrococcygeal disorders, not elsewhere classified: Secondary | ICD-10-CM
